# Patient Record
Sex: Female | Born: 1995 | Race: White | Hispanic: No | Marital: Married | Smoking: Never smoker
Health system: Southern US, Community
[De-identification: ages and names within clinical notes are randomized; demographics above are authoritative.]

## PROBLEM LIST (undated history)

## (undated) ENCOUNTER — Inpatient Hospital Stay (HOSPITAL_COMMUNITY): Payer: Self-pay

## (undated) DIAGNOSIS — F909 Attention-deficit hyperactivity disorder, unspecified type: Secondary | ICD-10-CM

## (undated) DIAGNOSIS — Z8719 Personal history of other diseases of the digestive system: Secondary | ICD-10-CM

## (undated) DIAGNOSIS — J189 Pneumonia, unspecified organism: Secondary | ICD-10-CM

## (undated) DIAGNOSIS — B2790 Infectious mononucleosis, unspecified without complication: Secondary | ICD-10-CM

## (undated) DIAGNOSIS — R112 Nausea with vomiting, unspecified: Secondary | ICD-10-CM

## (undated) DIAGNOSIS — F319 Bipolar disorder, unspecified: Secondary | ICD-10-CM

## (undated) DIAGNOSIS — F32A Depression, unspecified: Secondary | ICD-10-CM

## (undated) DIAGNOSIS — K760 Fatty (change of) liver, not elsewhere classified: Secondary | ICD-10-CM

## (undated) DIAGNOSIS — F259 Schizoaffective disorder, unspecified: Secondary | ICD-10-CM

## (undated) DIAGNOSIS — L732 Hidradenitis suppurativa: Secondary | ICD-10-CM

## (undated) DIAGNOSIS — E669 Obesity, unspecified: Secondary | ICD-10-CM

## (undated) DIAGNOSIS — N926 Irregular menstruation, unspecified: Secondary | ICD-10-CM

## (undated) DIAGNOSIS — R569 Unspecified convulsions: Secondary | ICD-10-CM

## (undated) DIAGNOSIS — E1140 Type 2 diabetes mellitus with diabetic neuropathy, unspecified: Secondary | ICD-10-CM

## (undated) DIAGNOSIS — O139 Gestational [pregnancy-induced] hypertension without significant proteinuria, unspecified trimester: Secondary | ICD-10-CM

## (undated) DIAGNOSIS — D649 Anemia, unspecified: Secondary | ICD-10-CM

## (undated) DIAGNOSIS — Z3490 Encounter for supervision of normal pregnancy, unspecified, unspecified trimester: Secondary | ICD-10-CM

## (undated) DIAGNOSIS — N83209 Unspecified ovarian cyst, unspecified side: Secondary | ICD-10-CM

## (undated) DIAGNOSIS — F419 Anxiety disorder, unspecified: Secondary | ICD-10-CM

## (undated) DIAGNOSIS — G4730 Sleep apnea, unspecified: Secondary | ICD-10-CM

## (undated) DIAGNOSIS — O2340 Unspecified infection of urinary tract in pregnancy, unspecified trimester: Secondary | ICD-10-CM

## (undated) DIAGNOSIS — F250 Schizoaffective disorder, bipolar type: Secondary | ICD-10-CM

## (undated) HISTORY — DX: Anemia, unspecified: D64.9

## (undated) HISTORY — DX: Obesity, unspecified: E66.9

## (undated) HISTORY — DX: Personal history of other diseases of the digestive system: Z87.19

## (undated) HISTORY — PX: TONSILLECTOMY: SUR1361

## (undated) HISTORY — PX: ADENOIDECTOMY: SUR15

## (undated) HISTORY — DX: Unspecified ovarian cyst, unspecified side: N83.209

## (undated) HISTORY — PX: ANTERIOR CRUCIATE LIGAMENT REPAIR: SHX115

## (undated) HISTORY — DX: Nausea with vomiting, unspecified: R11.2

## (undated) HISTORY — DX: Irregular menstruation, unspecified: N92.6

## (undated) HISTORY — DX: Hidradenitis suppurativa: L73.2

## (undated) HISTORY — PX: INCISION AND DRAINAGE ABSCESS: SHX5864

## (undated) HISTORY — DX: Infectious mononucleosis, unspecified without complication: B27.90

## (undated) HISTORY — PX: WISDOM TOOTH EXTRACTION: SHX21

## (undated) HISTORY — DX: Gestational (pregnancy-induced) hypertension without significant proteinuria, unspecified trimester: O13.9

## (undated) HISTORY — DX: Encounter for supervision of normal pregnancy, unspecified, unspecified trimester: Z34.90

---
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
## 2016-01-12 ENCOUNTER — Other Ambulatory Visit (HOSPITAL_COMMUNITY): Payer: Self-pay | Admitting: Sports Medicine

## 2016-01-12 DIAGNOSIS — M545 Low back pain: Principal | ICD-10-CM

## 2016-01-12 DIAGNOSIS — G8929 Other chronic pain: Secondary | ICD-10-CM

## 2016-01-26 ENCOUNTER — Telehealth: Payer: Self-pay | Admitting: Obstetrics and Gynecology

## 2016-01-26 NOTE — Telephone Encounter (Signed)
Phentermine requires a visit, and counsel.

## 2016-01-26 NOTE — Telephone Encounter (Signed)
Patient wants to restart Phentermine.Does patient need an appointment or will you send in prescription? Please advise.

## 2016-01-27 NOTE — Telephone Encounter (Signed)
Informed patient she needed appointment for counsel for Phentermine. Pt verbalized understanding.

## 2016-02-02 ENCOUNTER — Ambulatory Visit (INDEPENDENT_AMBULATORY_CARE_PROVIDER_SITE_OTHER): Payer: Medicaid Other | Admitting: "Endocrinology

## 2016-02-02 ENCOUNTER — Ambulatory Visit: Payer: Medicaid Other | Admitting: Obstetrics and Gynecology

## 2016-02-02 ENCOUNTER — Encounter: Payer: Self-pay | Admitting: "Endocrinology

## 2016-02-02 VITALS — BP 118/73 | HR 81 | Ht 65.0 in | Wt 242.0 lb

## 2016-02-02 DIAGNOSIS — E118 Type 2 diabetes mellitus with unspecified complications: Secondary | ICD-10-CM | POA: Diagnosis not present

## 2016-02-02 DIAGNOSIS — Z794 Long term (current) use of insulin: Secondary | ICD-10-CM | POA: Diagnosis not present

## 2016-02-02 MED ORDER — GABAPENTIN 100 MG PO CAPS: 100.0000 mg | ORAL_CAPSULE | Freq: Three times a day (TID) | ORAL | 3 refills | Status: DC

## 2016-02-02 MED ORDER — ACCU-CHEK AVIVA DEVI: 0 refills | Status: DC

## 2016-02-02 MED ORDER — METFORMIN HCL 1000 MG PO TABS: 1000.0000 mg | ORAL_TABLET | Freq: Two times a day (BID) | ORAL | 3 refills | Status: DC

## 2016-02-02 MED ORDER — ENALAPRIL MALEATE 5 MG PO TABS: 5.0000 mg | ORAL_TABLET | Freq: Every day | ORAL | 6 refills | Status: DC

## 2016-02-02 MED ORDER — GLUCOSE BLOOD VI STRP: ORAL_STRIP | 3 refills | Status: DC

## 2016-02-02 NOTE — Patient Instructions (Signed)

## 2016-02-02 NOTE — Progress Notes (Signed)
Subjective:    Patient ID: Heather [name], female    DOB: 21-Jan-1995. Patient is being seen in consultation for management of diabetes requested by  [facility]  Past Medical History:  Diagnosis Date  . ADHD (attention deficit hyperactivity disorder)   . Anemia   . Asthma   . Bipolar 1 disorder ([place])   . Diabetes mellitus   . GHTN vs. CHTN 06/11/2015  . Hidradenitis 06/05/2012  . History of hiatal hernia   . Irregular bleeding 06/05/2012  . Mononucleosis january 2014  . Nausea and vomiting 01/21/2015  . Obesity (BMI 30-39.9)   . Other and unspecified ovarian cyst 07/24/2012   Right  Ovarian cyst 8 cm on [place] 7/15 in ER  . Pregnant 01/21/2015  . Schizo affective schizophrenia [facility])    Past Surgical History:  Procedure Laterality Date  . ADENOIDECTOMY    . ANTERIOR CRUCIATE LIGAMENT REPAIR    . 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 TONSILLECTOMY     Social History   Social History  . Marital status: Married    Spouse name: N/A  . Number of children: N/A  . Years of education: N/A   Social History Main Topics  . Smoking status: Never Smoker  . Smokeless tobacco: Never Used  . Alcohol use No  . Drug use: No  . Sexual activity: Not Currently    Birth control/ protection: None   Other Topics Concern  . None   Social History Narrative  . None   Outpatient Encounter Prescriptions as of 02/02/2016  Medication Sig  . enalapril (VASOTEC) 5 MG tablet Take 1 tablet (5 mg total) by mouth daily.  Marland Kitchen gabapentin (NEURONTIN) 100 MG capsule Take 1 capsule (100 mg total) by mouth 3 (three) times daily.  . insulin aspart (NOVOLOG FLEXPEN) 100 UNIT/ML FlexPen Inject 1 Units into the skin 3 (three) times daily with meals.  . Insulin Glargine (LANTUS SOLOSTAR) 100 UNIT/ML Solostar Pen Inject 40 Units into the skin at bedtime.  . [DISCONTINUED] enalapril (VASOTEC) 10 MG tablet Take 10 mg by mouth daily.  .  [DISCONTINUED] gabapentin (NEURONTIN) 300 MG capsule Take 300 mg by mouth daily.  . Blood Glucose Monitoring Suppl (ACCU-CHEK AVIVA) device Use as instructed  . glucose blood (ACCU-CHEK AVIVA) test strip Use to test glucose 4 times a day  . metFORMIN (GLUCOPHAGE) 1000 MG tablet Take 1 tablet (1,000 mg total) by mouth 2 (two) times daily with a meal.  . [DISCONTINUED] escitalopram (LEXAPRO) 20 MG tablet Take 1 tablet daily  . [DISCONTINUED] metFORMIN (GLUCOPHAGE) 1000 MG tablet Take 1 tablet (1,000 mg total) by mouth 2 (two) times daily with a meal.  . [DISCONTINUED] metroNIDAZOLE (FLAGYL) 500 MG tablet Take 1 tablet (500 mg total) by mouth 2 (two) times daily. X 7 days. No sex or alcohol while taking  . [DISCONTINUED] pantoprazole (PROTONIX) 20 MG tablet Take 20 mg by mouth daily.  . [DISCONTINUED] tiZANidine (ZANAFLEX) 4 MG tablet Take 4 mg by mouth every 6 (six) hours as needed for muscle spasms.   Facility-Administered Encounter Medications as of 02/02/2016  Medication  . oxyCODONE-acetaminophen (PERCOCET/ROXICET) 5-325 MG per tablet 1 tablet   ALLERGIES: Allergies  Allergen Reactions  . Prozac [Fluoxetine Hcl]     Stiffness in neck, "stroke like" symptoms  . Cephalosporins Other (See Comments)    Unknown childhood reaction  . Anette Guarneri [Lurasidone  Hcl]   . Prozac [Fluoxetine Hcl]    VACCINATION STATUS: Immunization History  Administered Date(s) Administered  . DTaP 08/15/1996, 10/16/1996, 12/19/1996, 02/13/1998, 07/18/2001  . H1N1 11/09/2007  . HPV Quadrivalent 11/03/2010, 05/17/2013  . Hepatitis A, Ped/Adol-2 Dose 05/17/2013  . Hepatitis B 1995-06-01, 08/15/1996, 12/19/1996  . HiB (PRP-OMP) 08/15/1996, 10/16/1996, 02/13/1998  . IPV 08/15/1996, 10/16/1996, 02/13/1998, 07/18/2001  . Influenza Whole 11/03/2005, 11/15/2006, 11/09/2007, 09/13/2008, 09/12/2009, 11/03/2010  . Influenza,inj,Quad PF,36+ Mos 10/16/2013  . Influenza-Unspecified 10/23/2012  . MMR 07/15/1997, 07/18/2001  .  Meningococcal Conjugate 09/13/2008  . Pneumococcal Conjugate-13 07/31/1999  . Td 08/22/2007  . Tdap 08/22/2007, 06/27/2014  . Varicella 07/15/1997, 09/13/2008    Diabetes  She presents for her initial diabetic visit. She has type 2 diabetes mellitus. Onset time: She was diagnosed at age 55 or 57 years. Disease course: She states that she was treated only with metformin until last year when she was pregnant. Due to loss of control with A1c of 8.5%, she is currently on basal/bolus insulin along with her metformin. She denies any history of DKA. She is 4 months postpartum  There are no hypoglycemic associated symptoms. Pertinent negatives for hypoglycemia include no confusion, headaches, pallor or seizures. Associated symptoms include fatigue, foot paresthesias, polydipsia and polyuria. Pertinent negatives for diabetes include no chest pain and no polyphagia. There are no hypoglycemic complications. Symptoms are worsening. Diabetic complications include peripheral neuropathy. Risk factors for coronary artery disease include diabetes mellitus, hypertension and obesity. Current diabetic treatment includes insulin injections (She is on Lantus 55 units daily at bedtime, NovoLog 10 units on average 3 times a day before meals, deformity 1000 mg by mouth twice a day.). Her weight is increasing steadily. She is following a generally unhealthy diet. When asked about meal planning, she reported none. She has not had a previous visit with a dietitian. She rarely participates in exercise. (She states she does not have working meter, she did not bring any logs to review today.) An ACE inhibitor/angiotensin II receptor blocker is being taken. She does not see a podiatrist.Eye exam is not current.       Review of Systems  Constitutional: Positive for fatigue. Negative for chills, fever and unexpected weight change.  HENT: Negative for trouble swallowing and voice change.   Eyes: Negative for visual disturbance.   Respiratory: Negative for cough, shortness of breath and wheezing.   Cardiovascular: Negative for chest pain, palpitations and leg swelling.  Gastrointestinal: Negative for diarrhea, nausea and vomiting.  Endocrine: Positive for polydipsia and polyuria. Negative for cold intolerance, heat intolerance and polyphagia.  Musculoskeletal: Negative for arthralgias and myalgias.  Skin: Negative for color change, pallor, rash and wound.  Neurological: Negative for seizures and headaches.  Psychiatric/Behavioral: Negative for confusion and suicidal ideas.    Objective:    BP 118/73   Pulse 81   Ht 5' 5"  (1.651 m)   Wt 242 lb (109.8 kg)   BMI 40.27 kg/m   Wt Readings from Last 3 Encounters:  02/02/16 242 lb (109.8 kg) (>99 %, Z > 2.33)*  10/20/15 239 lb (108.4 kg) (>99 %, Z > 2.33)*  09/15/15 227 lb 6.4 oz (103.1 kg) (99 %, Z= 2.28)*   * Growth percentiles are based on CDC 2-20 Years data.    Physical Exam  Constitutional: She is oriented to person, place, and time. She appears well-developed.  HENT:  Head: Normocephalic and atraumatic.  Eyes: EOM are normal.  Neck: Normal range of motion. Neck supple. No tracheal  deviation present. No thyromegaly present.  Cardiovascular: Normal rate and regular rhythm.   Pulmonary/Chest: Effort normal and breath sounds normal.  Abdominal: Soft. Bowel sounds are normal. There is no tenderness. There is no guarding.  Musculoskeletal: Normal range of motion. She exhibits no edema.  Neurological: She is alert and oriented to person, place, and time. She has normal reflexes. No cranial nerve deficit. Coordination normal.  Skin: Skin is warm and dry. No rash noted. No erythema. No pallor.  Psychiatric: She has a normal mood and affect. Judgment normal.     CMP ( most recent) CMP     Component Value Date/Time   NA 134 (L) 09/03/2015 2343   NA 138 07/16/2015 1121   K 3.9 09/03/2015 2343   CL 106 09/03/2015 2343   CO2 18 (L) 09/03/2015 2343    GLUCOSE 91 09/03/2015 2343   BUN 5 (L) 09/03/2015 2343   BUN 5 (L) 07/16/2015 1121   CREATININE 0.37 (L) 09/03/2015 2343   CREATININE 0.61 03/28/2012 1109   CALCIUM 8.4 (L) 09/03/2015 2343   PROT 6.4 (L) 09/03/2015 2343   PROT 6.9 07/16/2015 1121   ALBUMIN 3.0 (L) 09/03/2015 2343   ALBUMIN 4.2 07/16/2015 1121   AST 47 (H) 09/03/2015 2343   ALT 27 09/03/2015 2343   ALKPHOS 111 09/03/2015 2343   BILITOT 0.3 09/03/2015 2343   BILITOT <0.2 07/16/2015 1121   GFRNONAA >60 09/03/2015 2343   GFRNONAA >89 03/28/2012 1109   GFRAA >60 09/03/2015 2343   GFRAA >89 03/28/2012 1109     Diabetic Labs (most recent): Lab Results  Component Value Date   HGBA1C 8.5 12/23/2015   HGBA1C 6.3 (H) 03/19/2015   HGBA1C 6.8 (H) 03/28/2012     Lipid Panel ( most recent) Lipid Panel     Component Value Date/Time   CHOL 152 04/10/2013 0904   TRIG 158 (H) 04/10/2013 0904   HDL 34 (L) 04/10/2013 0904   CHOLHDL 4.5 04/10/2013 0904   VLDL 32 04/10/2013 0904   LDLCALC 86 04/10/2013 0904              Assessment & Plan:   1. Type 2 diabetes mellitus with complication, with long-term current use of insulin ([place]) - Patient has currently uncontrolled symptomatic type  ( likely 2) DM since  ?21 years of age,  with most recent A1c of 8.5 %. Recent labs reviewed. - Reportedly, she was treated only with metformin until last year when she was pregnant and required insulin treatment, currently on basal/bolus insulin.   - She reports peripheral neuropathy as a complication of diabetes, and patient remains at a high risk for more acute and chronic complications of diabetes which include CAD, CVA, CKD, retinopathy, and neuropathy. These are all discussed in detail with the patient.  - I have counseled the patient on diet management and weight loss, by adopting a carbohydrate restricted/protein rich diet.  - Suggestion is made for patient to avoid simple carbohydrates   from their diet including Cakes ,  Desserts, Ice Cream,  Soda (  diet and regular) , Sweet Tea , Candies,  Chips, Cookies, Artificial Sweeteners,   and "Sugar-free" Products . This will help patient to have stable blood glucose profile and potentially avoid unintended weight gain.  - I encouraged the patient to switch to  unprocessed or minimally processed complex starch and increased protein intake (animal or plant source), fruits, and vegetables.  - Patient is advised to stick to a routine mealtimes to eat  3 meals  a day and avoid unnecessary snacks ( to snack only to correct hypoglycemia).  - The patient will be scheduled with Jearld Fenton, RDN, CDE for individualized DM education.  - I have approached patient with the following individualized plan to manage diabetes and patient agrees:   - Given her body habitus, absence of DKA and her history, therapy with metformin with reasonable A1c until last year despite the fact that she was diagnosed at age 15, point to a possibility of type 2 versus type 1 diabetes. - She will be tested with anti-pancreatic antibodies when she approaches target A1c of 7%.   - For now, I  will proceed to initiate  strict monitoring of glucose  AC and HS for one week and return with her meter and logs to review. - I will proceed to lower her Lantus to 40 units daily at bedtime. I advised her to hold NovoLog for now. - I advised her to continue metformin 1000 mg by mouth twice a day.  - Patient will be considered for incretin therapy as appropriate next visit. - Patient specific target  A1c;  LDL, HDL, Triglycerides, and  Waist Circumference were discussed in detail.  2) BP/HTN: Controlled.  I lowered her enalapril 25 mg by mouth every morning. 3) Lipids/HPL:   Controlled, LDL of 86. No recent fasting lipid panel.  She is not on any statins for now. 4)  Weight/Diet: CDE Consult will be initiated , exercise, and detailed carbohydrates information provided.  5) Chronic Care/Health  Maintenance:  -Patient is on ACEi medications and encouraged to continue to follow up with Ophthalmology, Podiatrist at least yearly or according to recommendations, and advised to   stay away from smoking. I have recommended yearly flu vaccine and pneumonia vaccinatio n at least every 5 years; moderate intensity exercise for up to 150 minutes weekly; and  sleep for at least 7 hours a day.  - 60 minutes of time was spent on the care of this patient , 50% of which was applied for counseling on diabetes complications and their preventions.  - Patient to bring meter and  blood glucose logs during her next visit.   - I advised patient to maintain close follow up with [facility] for primary care needs.  Follow up plan: - Return in about 1 week (around 02/09/2016) for follow up with meter and logs- no labs.  Glade Lloyd, MD Phone: [phone]  Fax: [phone]   02/02/2016, 11:47 AM

## 2016-02-06 ENCOUNTER — Inpatient Hospital Stay (HOSPITAL_COMMUNITY)
Admission: RE | Admit: 2016-02-06 | Discharge: 2016-02-06 | Disposition: A | Discharge status: Home / Self Care | Payer: Self-pay | Source: Ambulatory Visit

## 2016-02-06 NOTE — Pre-Procedure Instructions (Signed)
Heather [name]  02/06/2016      Your procedure is scheduled on Thursday February 8.  Report to [facility] Admitting at 6:00 A.M.  Call this number if you have problems the morning of surgery:  [phone]   Remember:  Do not eat food or drink liquids after midnight.  Take these medicines the morning of surgery with A SIP OF WATER: acetaminophen (tylenol), gabapentin (neurontin)  7 days prior to surgery STOP taking any Aspirin, Aleve, Naproxen, Ibuprofen, Motrin, Advil, Goody's, BC's, all herbal medications, fish oil, and all vitamins  WHAT DO I DO ABOUT MY DIABETES MEDICATION?   Marland Kitchen Do not take oral diabetes medicines (pills) the morning of surgery. Do not take metformin (glucophage) the day of surgery  . THE NIGHT BEFORE SURGERY, take 20 units of lantus insulin.   How to Manage Your Diabetes Before and After Surgery  Why is it important to control my blood sugar before and after surgery? . Improving blood sugar levels before and after surgery helps healing and can limit problems. . A way of improving blood sugar control is eating a healthy diet by: o  Eating less sugar and carbohydrates o  Increasing activity/exercise o  Talking with your doctor about reaching your blood sugar goals . High blood sugars (greater than 180 mg/dL) can raise your risk of infections and slow your recovery, so you will need to focus on controlling your diabetes during the weeks before surgery. . Make sure that the doctor who takes care of your diabetes knows about your planned surgery including the date and location.  How do I manage my blood sugar before surgery? . Check your blood sugar at least 4 times a day, starting 2 days before surgery, to make sure that the level is not too high or low. o Check your blood sugar the morning of your surgery when you wake up and every 2 hours until you get to the Short Stay unit. . If your blood sugar is less than 70 mg/dL, you will need to treat for  low blood sugar: o Do not take insulin. o Treat a low blood sugar (less than 70 mg/dL) with  cup of clear juice (cranberry or apple), 4 glucose tablets, OR glucose gel. o Recheck blood sugar in 15 minutes after treatment (to make sure it is greater than 70 mg/dL). If your blood sugar is not greater than 70 mg/dL on recheck, call [phone] for further instructions. . Report your blood sugar to the short stay nurse when you get to Short Stay.  . If you are admitted to the hospital after surgery: o Your blood sugar will be checked by the staff and you will probably be given insulin after surgery (instead of oral diabetes medicines) to make sure you have good blood sugar levels. o The goal for blood sugar control after surgery is 80-180 mg/dL.   Do not wear jewelry, make-up or nail polish.  Do not wear lotions, powders, or perfumes, or deodorant.  Do not shave 48 hours prior to surgery.    Do not bring valuables to the hospital.  [facility] is not responsible for any belongings or valuables.  Contacts, dentures or bridgework may not be worn into surgery.  Leave your suitcase in the car.  After surgery it may be brought to your room.  For patients admitted to the hospital, discharge time will be determined by your treatment team.  Patients discharged the day of surgery will not be  allowed to drive home.

## 2016-02-08 ENCOUNTER — Encounter (HOSPITAL_COMMUNITY): Payer: Self-pay | Admitting: Anesthesiology

## 2016-02-09 ENCOUNTER — Ambulatory Visit: Payer: Medicaid Other | Admitting: "Endocrinology

## 2016-02-11 ENCOUNTER — Encounter (HOSPITAL_COMMUNITY): Payer: Self-pay | Admitting: *Deleted

## 2016-02-11 NOTE — Progress Notes (Signed)
Spoke with pt for pre-op call. Pt denies cardiac history. Pt is diabetic, type 2 since age 89. Pt's last A1C was 8.5 on 12/23/15. Pt states her fasting blood sugar is usually high in the 200's. I informed her that her procedure could be cancelled if her blood sugar is 250 or greater in the AM. Pt instructed to take 1/2 of her regular dose of Lantus this evening (will take 20 units). Instructed to check blood sugar in the AM. If blood sugar is 70 or below, treat with 1/2 cup of clear juice (apple or cranberry) and recheck blood sugar 15 minutes after drinking juice. Pt voiced understanding

## 2016-02-12 ENCOUNTER — Ambulatory Visit (HOSPITAL_COMMUNITY)
Admission: RE | Admit: 2016-02-12 | Discharge: 2016-02-12 | Disposition: A | Discharge status: Home / Self Care | Payer: Medicaid Other | Source: Ambulatory Visit | Attending: Sports Medicine | Admitting: Sports Medicine

## 2016-02-12 ENCOUNTER — Ambulatory Visit (HOSPITAL_COMMUNITY)
Admission: RE | Admit: 2016-02-12 | Payer: Medicaid Other | Source: Ambulatory Visit | Admitting: Unknown Physician Specialty

## 2016-02-12 ENCOUNTER — Encounter (HOSPITAL_COMMUNITY): Payer: Self-pay

## 2016-02-12 ENCOUNTER — Telehealth: Payer: Self-pay | Admitting: "Endocrinology

## 2016-02-12 HISTORY — DX: Sleep apnea, unspecified: G47.30

## 2016-02-12 HISTORY — DX: Unspecified convulsions: R56.9

## 2016-02-12 HISTORY — DX: Fatty (change of) liver, not elsewhere classified: K76.0

## 2016-02-12 HISTORY — DX: Type 2 diabetes mellitus with diabetic neuropathy, unspecified: E11.40

## 2016-02-12 HISTORY — DX: Pneumonia, unspecified organism: J18.9

## 2016-02-12 SURGERY — RADIOLOGY WITH ANESTHESIA
Anesthesia: General

## 2016-02-12 NOTE — Telephone Encounter (Signed)
Pt notifed.

## 2016-02-12 NOTE — Telephone Encounter (Signed)
Pt is having pain in her hands and feet when she puts them in water. Feels like pins and needles

## 2016-02-12 NOTE — Telephone Encounter (Signed)
Patient needs to return for her diabetes care with her meter and logs. She has to understand we don't prescribe pain medications. Proper treatment for diabetes will help with diabetes  related nerve pain.

## 2016-02-19 ENCOUNTER — Ambulatory Visit: Payer: Self-pay | Admitting: Nutrition

## 2016-02-23 ENCOUNTER — Ambulatory Visit: Payer: Medicaid Other | Admitting: Obstetrics and Gynecology

## 2016-02-27 ENCOUNTER — Ambulatory Visit: Payer: Medicaid Other | Admitting: "Endocrinology

## 2016-03-18 ENCOUNTER — Ambulatory Visit (INDEPENDENT_AMBULATORY_CARE_PROVIDER_SITE_OTHER): Payer: Medicaid Other | Admitting: Orthopaedic Surgery

## 2016-03-25 ENCOUNTER — Encounter (INDEPENDENT_AMBULATORY_CARE_PROVIDER_SITE_OTHER): Payer: Self-pay | Admitting: Orthopaedic Surgery

## 2016-03-25 ENCOUNTER — Ambulatory Visit (INDEPENDENT_AMBULATORY_CARE_PROVIDER_SITE_OTHER): Payer: Medicaid Other | Admitting: Orthopaedic Surgery

## 2016-03-25 DIAGNOSIS — M545 Low back pain, unspecified: Secondary | ICD-10-CM | POA: Insufficient documentation

## 2016-03-25 DIAGNOSIS — G8929 Other chronic pain: Secondary | ICD-10-CM

## 2016-03-25 NOTE — Progress Notes (Signed)
Office Visit Note   Patient: Heather [name]           Date of Birth: June 30, 1995           MRN: [id number] Visit Date: 03/25/2016              [facility] [address] [place], [place] [id number] [facility]   Assessment & Plan: Visit Diagnoses:  1. 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 We discussed she needs to work on losing at least 40 pounds. She needs to work on core strengthening. No  radiculopathy on exam. We discussed activity modification and the way that she picks up her daughter. She finally does get her sugars down and gets her MRI be happy to review it I discussed expected will be similar to the MRI 04/27/2012. We discussed lifestyle modifications to improve her weight, help her diabetes. Without nerve root compression she probably could just take the Neurontin at night if she is having trouble sleeping does she gets her weight down, diabetes under better control and gets herself back into shape.  Follow-Up Instructions: Return if symptoms worsen or fail to improve.   Orders:  No orders of the defined types were placed in this encounter.  No orders of the defined types were placed in this encounter.     Procedures: No procedures performed   Clinical Data: No additional findings.   Subjective: Chief Complaint  Patient presents with  . Lower Back - Pain    Patient presents with low back pain. She has seen different physicians for the pain and a MRI Lumbar Spine was ordered. She was unable to do the MRI even in an open unit with sedation. She was scheduled to have conscious IV sedation for MRI but was unable to have that done due to her blood sugar being too high.   Patient has significant problems with claustrophobia. Glucose was over 200 so they did not do the conscious sedation and obtain an MRI. Previous MRI 2014 showed  evidence of bilateral pars defects without disc space narrowing at 51 no disc dehydration and no spondylolisthesis. She did not have evidence of impingement. She has primarily pain on the right side not left.  Review of Systems PA system positive for type 2 diabetes at age 21. She has increased BMI is 4530-month-old daughter, chronic asthma, microalbuminemia, bipolar disorder, elevated liver function tests, chronic low back pain primarily right side.   Objective: Vital Signs: BP 125/74   Pulse 92   Ht 5\' 5"  (1.651 m)   Wt 237 lb (107.5 kg)   BMI 39.44 kg/m   Physical Exam  Constitutional: She is oriented to person, place, and time. She appears well-developed.  HENT:  Head: Normocephalic.  Right Ear: External ear normal.  Left Ear: External ear normal.  Eyes: Pupils are equal, round, and reactive to light.  Neck: No tracheal deviation present. No thyromegaly present.  Cardiovascular: Normal rate.   Pulmonary/Chest: Effort normal.  Abdominal: Soft.  Musculoskeletal:  Patient is lumbosacral tenderness also some tenderness joints scapula. Right sciatic notch tenderness no trochanteric bursal tenderness negative straight leg raising 90 no pain with hip range of motion. She complains of pain with Pearlean BrownieFaber testing in supine position. When she sits on the exam table she crosses her legs underneath her and sits on her feet. She also figure 4  [place] style or sitting in discussing with the complaints of pain. Reflexes are 1+ and symmetrical no of weakness L4-L5 innervated muscles hip flexors adductors and abductors are strong. Normal heel toe gait she can heel and toe walk no fatigue negative Homan distal pulses normal. No plantar foot lesions.  Neurological: She is alert and oriented to person, place, and time.  Skin: Skin is warm and dry.  Psychiatric: She has a normal mood and affect. Her behavior is normal.   patient can do a sit up but complains of some back pain when she does it.  Ortho  Exam  Specialty Comments:  No specialty comments available.  Imaging: No results found.   PMFS History: Patient Active Problem List   Diagnosis Date Noted  . Low back pain 03/25/2016  . History of gestational hypertension 10/20/2015  . History of preterm delivery 10/20/2015  . Depression 07/16/2015  . Boil 07/10/2015  . Elevated LFTs 07/10/2015  . Deficiency anemia 07/15/2013  . 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 Hidradenitis 06/05/2012  . Bipolar disorder, unspecified 03/21/2012  . Diabetes mellitus, type 2 (HCC) 03/21/2012  . Asthma, chronic 03/21/2012  . Hypercholesteremia 01/04/2012  . Bulimia nervosa, purging type 11/05/2010  . Moderate mood disorder (HCC) 11/05/2010   Past Medical History:  Diagnosis Date  . ADHD (attention deficit hyperactivity disorder)   . Anemia   . Asthma    as a child  . Bipolar 1 disorder (HCC)   . Diabetes mellitus    type 2  . Diabetic neuropathy (HCC)   . Fatty liver   . GHTN vs. CHTN 06/11/2015  . Hidradenitis 06/05/2012  . History of hiatal hernia   . Irregular bleeding 06/05/2012  . Mononucleosis january 2014  . Nausea and vomiting 01/21/2015  . Obesity (BMI 30-39.9)   . Other and unspecified ovarian cyst 07/24/2012   Right  Ovarian cyst 8 cm on [place] 7/15 in ER  . Pneumonia   . Pregnant 01/21/2015  . Schizo affective schizophrenia (HCC)   . Seizures (HCC)    febrile seizures as a child  . Sleep apnea    no cpap    Family History  Problem Relation Age of Onset  . Diabetes Mother   . Hypertension Mother   . Bipolar disorder Father   . Bipolar disorder Brother   . Anxiety disorder Brother   . Asthma Sister     intrinsic  . Stroke Maternal Grandmother   . Cancer Other     colon, lung, brain-paternal side    Past Surgical History:  Procedure Laterality Date  . ADENOIDECTOMY    . ANTERIOR CRUCIATE LIGAMENT REPAIR    . INCISION AND DRAINAGE ABSCESS     Recurrent abscesses of labia and groin area with I  and D in office and ER  . TONSILLECTOMY    . WISDOM TOOTH EXTRACTION     Social History   Occupational History  . Not on file.   Social History Main Topics  . Smoking status: Never Smoker  . Smokeless tobacco: Never Used  . Alcohol use No  . Drug use: No  . Sexual activity: Not Currently    Birth control/ protection: None

## 2016-04-05 ENCOUNTER — Ambulatory Visit: Payer: Medicaid Other | Admitting: "Endocrinology

## 2016-05-20 ENCOUNTER — Emergency Department (HOSPITAL_COMMUNITY)
Admission: EM | Admit: 2016-05-20 | Discharge: 2016-05-20 | Disposition: A | Discharge status: Home / Self Care | Payer: Medicaid Other | Attending: Emergency Medicine | Admitting: Emergency Medicine

## 2016-05-20 ENCOUNTER — Encounter (HOSPITAL_COMMUNITY): Payer: Self-pay | Admitting: *Deleted

## 2016-05-20 DIAGNOSIS — F909 Attention-deficit hyperactivity disorder, unspecified type: Secondary | ICD-10-CM | POA: Diagnosis not present

## 2016-05-20 DIAGNOSIS — E119 Type 2 diabetes mellitus without complications: Secondary | ICD-10-CM | POA: Diagnosis not present

## 2016-05-20 DIAGNOSIS — Z794 Long term (current) use of insulin: Secondary | ICD-10-CM | POA: Diagnosis not present

## 2016-05-20 DIAGNOSIS — L02411 Cutaneous abscess of right axilla: Secondary | ICD-10-CM | POA: Diagnosis not present

## 2016-05-20 DIAGNOSIS — J45909 Unspecified asthma, uncomplicated: Secondary | ICD-10-CM | POA: Diagnosis not present

## 2016-05-20 DIAGNOSIS — L732 Hidradenitis suppurativa: Secondary | ICD-10-CM | POA: Diagnosis not present

## 2016-05-20 DIAGNOSIS — L0291 Cutaneous abscess, unspecified: Secondary | ICD-10-CM

## 2016-05-20 MED ORDER — TRAMADOL HCL 50 MG PO TABS: 50.0000 mg | ORAL_TABLET | Freq: Four times a day (QID) | ORAL | 0 refills | Status: DC | PRN

## 2016-05-20 MED ORDER — DOXYCYCLINE HYCLATE 100 MG PO CAPS: 100.0000 mg | ORAL_CAPSULE | Freq: Two times a day (BID) | ORAL | 0 refills | Status: DC

## 2016-05-20 MED ORDER — CLINDAMYCIN PHOSPHATE 1 % EX GEL: Freq: Two times a day (BID) | CUTANEOUS | 0 refills | Status: DC

## 2016-05-20 NOTE — ED Provider Notes (Signed)
[facility]ENCY DEPT Provider Note   CSN: [id number] Arrival date & time: 05/20/16  2101     History   Chief Complaint Chief Complaint  Patient presents with  . Abscess    HPI Heather [name] is a 21 y.o. female.  The history is provided by the patient. No language interpreter was used.  Abscess  Location:  Shoulder/arm Size:  1 cm Abscess quality: redness and warmth   Red streaking: no   Progression:  Worsening Relieved by:  Nothing Worsened by:  Nothing Ineffective treatments:  None tried Risk factors: prior abscess    Pt complains of abscess under right axilla and left groin.  Pt is diabetic.  She has a history of Hidradenitis.  Pt has been told by dermatologist that she can not have surgery until diabetes is controlled.  Pt reports her diabetes has never been controlled Past Medical History:  Diagnosis Date  . ADHD (attention deficit hyperactivity disorder)   . Anemia   . Asthma    as a child  . Bipolar 1 disorder (HCC)   . Diabetes mellitus    type 2  . Diabetic neuropathy (HCC)   . Fatty liver   . GHTN vs. CHTN 06/11/2015  . Hidradenitis 06/05/2012  . History of hiatal hernia   . Irregular bleeding 06/05/2012  . Mononucleosis january 2014  . Nausea and vomiting 01/21/2015  . Obesity (BMI 30-39.9)   . Other and unspecified ovarian cyst 07/24/2012   Right  Ovarian cyst 8 cm on [place] 7/15 in ER  . Pneumonia   . Pregnant 01/21/2015  . Schizo affective schizophrenia (HCC)   . Seizures (HCC)    febrile seizures as a child  . Sleep apnea    no cpap    Patient Active Problem List   Diagnosis Date Noted  . Low back pain 03/25/2016  . History of gestational hypertension 10/20/2015  . History of preterm delivery 10/20/2015  . Depression 07/16/2015  . Boil 07/10/2015  . Elevated LFTs 07/10/2015  . Deficiency anemia 07/15/2013  . 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 Hidradenitis 06/05/2012  . Bipolar disorder, unspecified (HCC) 03/21/2012  .  Diabetes mellitus, type 2 (HCC) 03/21/2012  . Asthma, chronic 03/21/2012  . Hypercholesteremia 01/04/2012  . Bulimia nervosa, purging type 11/05/2010  . Moderate mood disorder (HCC) 11/05/2010    Past Surgical History:  Procedure Laterality Date  . ADENOIDECTOMY    . ANTERIOR CRUCIATE LIGAMENT REPAIR    . 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 TONSILLECTOMY    . WISDOM TOOTH EXTRACTION      OB History    Gravida Para Term Preterm AB Living   1 1   1   1    SAB TAB Ectopic Multiple Live Births         0 1       Home Medications    Prior to Admission medications   Medication Sig Start Date End Date Taking? Authorizing Provider  acetaminophen (TYLENOL) 500 MG tablet Take 1,000 mg by mouth every 6 (six) hours as needed for moderate pain or headache.    [provider]  Blood Glucose Monitoring Suppl (ACCU-CHEK AVIVA) device Use as instructed 02/02/16   Roma Kayser, MD  clindamycin (CLINDAGEL) 1 % gel Apply topically 2 (two) times daily. 05/20/16   Elson Areas, PA-C  doxycycline (VIBRAMYCIN) 100 MG capsule Take 1  capsule (100 mg total) by mouth 2 (two) times daily. 05/20/16   Elson Areas, PA-C  enalapril (VASOTEC) 5 MG tablet Take 1 tablet (5 mg total) by mouth daily. Patient not taking: Reported on 03/25/2016 02/02/16   Roma Kayser, MD  gabapentin (NEURONTIN) 100 MG capsule Take 1 capsule (100 mg total) by mouth 3 (three) times daily. 02/02/16   Roma Kayser, MD  glucose blood (ACCU-CHEK AVIVA) test strip Use to test glucose 4 times a day 02/02/16   Roma Kayser, MD  ibuprofen (ADVIL,MOTRIN) 200 MG tablet Take 800 mg by mouth every 6 (six) hours as needed for moderate pain.    [provider]  Insulin Glargine (LANTUS SOLOSTAR) 100 UNIT/ML Solostar Pen Inject 40 Units into the skin at bedtime.    [provider]  metFORMIN (GLUCOPHAGE) 1000 MG  tablet Take 1 tablet (1,000 mg total) by mouth 2 (two) times daily with a meal. 02/02/16   Nida, Denman George, MD  traMADol (ULTRAM) 50 MG tablet Take 1 tablet (50 mg total) by mouth every 6 (six) hours as needed. 05/20/16   Elson Areas, PA-C    Family History Family History  Problem Relation Age of Onset  . Diabetes Mother   . Hypertension Mother   . Bipolar disorder Father   . Bipolar disorder Brother   . Anxiety disorder Brother   . Asthma Sister        intrinsic  . Stroke Maternal Grandmother   . Cancer Other        colon, lung, brain-paternal side    Social History Social History  Substance Use Topics  . Smoking status: Never Smoker  . Smokeless tobacco: Never Used  . Alcohol use No     Allergies   Prozac [fluoxetine hcl]; Cephalosporins; and Latuda [lurasidone hcl]   Review of Systems Review of Systems  All other systems reviewed and are negative.    Physical Exam Updated Vital Signs BP 114/84 (BP Location: Left Arm)   Pulse (!) 109   Temp 98.2 F (36.8 C) (Oral)   Resp 18   Ht 5\' 4"  (1.626 m)   Wt 225 lb (102.1 kg)   LMP 05/13/2016   SpO2 96%   BMI 38.62 kg/m   Physical Exam  Constitutional: She appears well-developed and well-nourished.  HENT:  Head: Normocephalic.  Right Ear: External ear normal.  Left Ear: External ear normal.  Mouth/Throat: Oropharynx is clear and moist.  Eyes: Pupils are equal, round, and reactive to light.  Neck: Normal range of motion.  Cardiovascular: Normal rate.   Pulmonary/Chest: Effort normal.  Abdominal: Soft.  Neurological: She is alert.  Skin: Skin is warm.  Psychiatric: She has a normal mood and affect.  Nursing note and vitals reviewed.    ED Treatments / Results  Labs (all labs ordered are listed, but only abnormal results are displayed) Labs Reviewed - No data to display  EKG  EKG Interpretation None       Radiology No results found.  Procedures Procedures (including critical care  time)  Medications Ordered in ED Medications - No data to display   Initial Impression / Assessment and Plan / ED Course  I have reviewed the triage vital signs and the nursing notes.  Pertinent labs & imaging results that were available during my care of the patient were reviewed by me and considered in my medical decision making (see chart for details).       Final Clinical Impressions(s) /  ED Diagnoses   Final diagnoses:  Abscess  Abscess of axilla, right  Hidradenitis suppurativa    New Prescriptions Discharge Medication List as of 05/20/2016 10:07 PM    An After Visit Summary was printed and given to the patient. Meds ordered this encounter  Medications  . doxycycline (VIBRAMYCIN) 100 MG capsule    Sig: Take 1 capsule (100 mg total) by mouth 2 (two) times daily.    Dispense:  20 capsule    Refill:  0    Order Specific Question:   Supervising Provider    Answer:   MILLER, BRIAN [[id number]]  . clindamycin (CLINDAGEL) 1 % gel    Sig: Apply topically 2 (two) times daily.    Dispense:  30 g    Refill:  0    Order Specific Question:   Supervising Provider    Answer:   MILLER, BRIAN [[id number]]  . traMADol (ULTRAM) 50 MG tablet    Sig: Take 1 tablet (50 mg total) by mouth every 6 (six) hours as needed.    Dispense:  12 tablet    Refill:  0    Order Specific Question:   Supervising Provider    Answer:   Eber HongMILLER, BRIAN [[id number]]     Elson AreasSofia, Jovann Luse K, PA-C 05/21/16 0000    Lavera GuiseLiu, Dana Duo, MD 05/21/16 [phone]

## 2016-05-20 NOTE — ED Notes (Signed)
Pt ambulatory to waiting room. Pt verbalized understanding of discharge instructions.   

## 2016-05-20 NOTE — ED Triage Notes (Signed)
Pt c/o hard knot under right arm and red painful bump to left upper thigh for the last few days

## 2016-05-20 NOTE — Discharge Instructions (Signed)
See your Physician for recheck of diabetes

## 2016-08-09 ENCOUNTER — Ambulatory Visit: Payer: Medicaid Other | Admitting: "Endocrinology

## 2016-08-25 ENCOUNTER — Other Ambulatory Visit: Payer: Self-pay

## 2016-08-25 ENCOUNTER — Other Ambulatory Visit: Payer: Self-pay | Admitting: "Endocrinology

## 2016-08-25 DIAGNOSIS — E1165 Type 2 diabetes mellitus with hyperglycemia: Secondary | ICD-10-CM

## 2016-08-25 DIAGNOSIS — IMO0002 Reserved for concepts with insufficient information to code with codable children: Secondary | ICD-10-CM

## 2016-08-25 DIAGNOSIS — E118 Type 2 diabetes mellitus with unspecified complications: Principal | ICD-10-CM

## 2016-08-28 LAB — COMPREHENSIVE METABOLIC PANEL
A/G RATIO: 1.3 (ref 1.2–2.2)
ALT: 63 IU/L — AB (ref 0–32)
AST: 65 IU/L — AB (ref 0–40)
Albumin: 4.3 g/dL (ref 3.5–5.5)
Alkaline Phosphatase: 81 IU/L (ref 39–117)
BILIRUBIN TOTAL: 0.2 mg/dL (ref 0.0–1.2)
BUN/Creatinine Ratio: 17 (ref 9–23)
BUN: 9 mg/dL (ref 6–20)
CALCIUM: 9.2 mg/dL (ref 8.7–10.2)
CHLORIDE: 97 mmol/L (ref 96–106)
CO2: 22 mmol/L (ref 20–29)
Creatinine, Ser: 0.52 mg/dL — ABNORMAL LOW (ref 0.57–1.00)
GFR calc Af Amer: 159 mL/min/{1.73_m2} (ref >59)
GFR calc non Af Amer: 138 mL/min/{1.73_m2} (ref >59)
GLUCOSE: 325 mg/dL — AB (ref 65–99)
Globulin, Total: 3.2 g/dL (ref 1.5–4.5)
POTASSIUM: 4.5 mmol/L (ref 3.5–5.2)
Sodium: 136 mmol/L (ref 134–144)
TOTAL PROTEIN: 7.5 g/dL (ref 6.0–8.5)

## 2016-08-28 LAB — HEMOGLOBIN A1C
Est. average glucose Bld gHb Est-mCnc: 295 mg/dL
Hgb A1c MFr Bld: 11.9 % — ABNORMAL HIGH (ref 4.8–5.6)

## 2016-08-31 ENCOUNTER — Telehealth: Payer: Self-pay

## 2016-08-31 NOTE — Telephone Encounter (Signed)
Pt was notified to get refills from PCP. We have seen pt one time on 02-02-16. She was supposed to return in one week. She has had 3 no shows. She is asking if we can refill

## 2016-08-31 NOTE — Telephone Encounter (Signed)
No refills, she can take one sample of each insulin to cover until she gets her labs and reschedule her visit.

## 2016-08-31 NOTE — Telephone Encounter (Signed)
Pt notified but now states that she has insulin and now wants Gabapentin. I explained to her that she had to wait until she was seen.

## 2016-09-16 ENCOUNTER — Ambulatory Visit: Payer: Medicaid Other | Admitting: "Endocrinology

## 2016-09-17 ENCOUNTER — Ambulatory Visit: Payer: Medicaid Other | Admitting: "Endocrinology

## 2016-10-04 ENCOUNTER — Encounter: Payer: Medicaid Other | Attending: "Endocrinology | Admitting: Nutrition

## 2016-10-04 ENCOUNTER — Encounter: Payer: Self-pay | Admitting: "Endocrinology

## 2016-10-04 ENCOUNTER — Ambulatory Visit (INDEPENDENT_AMBULATORY_CARE_PROVIDER_SITE_OTHER): Payer: Medicaid Other | Admitting: "Endocrinology

## 2016-10-04 VITALS — BP 140/84 | HR 90 | Ht 65.0 in | Wt 221.0 lb

## 2016-10-04 VITALS — Ht 65.0 in | Wt 221.0 lb

## 2016-10-04 DIAGNOSIS — E669 Obesity, unspecified: Secondary | ICD-10-CM

## 2016-10-04 DIAGNOSIS — Z6835 Body mass index (BMI) 35.0-35.9, adult: Secondary | ICD-10-CM

## 2016-10-04 DIAGNOSIS — E118 Type 2 diabetes mellitus with unspecified complications: Secondary | ICD-10-CM

## 2016-10-04 DIAGNOSIS — E1165 Type 2 diabetes mellitus with hyperglycemia: Secondary | ICD-10-CM

## 2016-10-04 DIAGNOSIS — IMO0002 Reserved for concepts with insufficient information to code with codable children: Secondary | ICD-10-CM

## 2016-10-04 DIAGNOSIS — Z91199 Patient's noncompliance with other medical treatment and regimen due to unspecified reason: Secondary | ICD-10-CM | POA: Insufficient documentation

## 2016-10-04 DIAGNOSIS — Z794 Long term (current) use of insulin: Secondary | ICD-10-CM

## 2016-10-04 DIAGNOSIS — Z9119 Patient's noncompliance with other medical treatment and regimen: Secondary | ICD-10-CM | POA: Insufficient documentation

## 2016-10-04 MED ORDER — ENALAPRIL MALEATE 5 MG PO TABS: 5.0000 mg | ORAL_TABLET | Freq: Every day | ORAL | 2 refills | Status: DC

## 2016-10-04 MED ORDER — FLUCONAZOLE 150 MG PO TABS: 150.0000 mg | ORAL_TABLET | Freq: Every day | ORAL | 0 refills | Status: DC

## 2016-10-04 MED ORDER — METFORMIN HCL 1000 MG PO TABS: 1000.0000 mg | ORAL_TABLET | Freq: Two times a day (BID) | ORAL | 2 refills | Status: DC

## 2016-10-04 NOTE — Progress Notes (Signed)
Subjective:    Patient ID: Heather [name], female    DOB: [birth year]. Patient is being seen in f/u for management of diabetes requested by  Medicine, [facility]  Past Medical History:  Diagnosis Date  . ADHD (attention deficit hyperactivity disorder)   . Anemia   . Asthma    as a child  . Bipolar 1 disorder ([place])   . Diabetes mellitus    type 2  . Diabetic neuropathy ([place])   . Fatty liver   . GHTN vs. CHTN 06/11/2015  . Hidradenitis 06/05/2012  . History of hiatal hernia   . Irregular bleeding 06/05/2012  . Mononucleosis january 2014  . Nausea and vomiting 01/21/2015  . Obesity (BMI 30-39.9)   . Other and unspecified ovarian cyst 07/24/2012   Right  Ovarian cyst 8 cm on [place] 7/15 in ER  . Pneumonia   . Pregnant 01/21/2015  . Schizo affective schizophrenia ([place])   . Seizures ([place])    febrile seizures as a child  . Sleep apnea    no cpap   Past Surgical History:  Procedure Laterality Date  . ADENOIDECTOMY    . ANTERIOR CRUCIATE LIGAMENT REPAIR    . 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 TONSILLECTOMY    . WISDOM TOOTH EXTRACTION     Social History   Social History  . Marital status: Married    Spouse name: N/A  . Number of children: N/A  . Years of education: N/A   Social History Main Topics  . Smoking status: Never Smoker  . Smokeless tobacco: Never Used  . Alcohol use No  . Drug use: No  . Sexual activity: Not Currently    Birth control/ protection: None   Other Topics Concern  . None   Social History Narrative  . None   Outpatient Encounter Prescriptions as of 10/04/2016  Medication Sig  . enalapril (VASOTEC) 5 MG tablet Take 1 tablet (5 mg total) by mouth daily.  Marland Kitchen gabapentin (NEURONTIN) 100 MG capsule Take 1 capsule (100 mg total) by mouth 3 (three) times daily. (Patient taking differently: Take 300 mg by mouth 3 (three) times daily. )  . Insulin Glargine (LANTUS SOLOSTAR) 100  UNIT/ML Solostar Pen Inject 50 Units into the skin at bedtime.  . metFORMIN (GLUCOPHAGE) 1000 MG tablet Take 1 tablet (1,000 mg total) by mouth 2 (two) times daily with a meal.  . [DISCONTINUED] enalapril (VASOTEC) 5 MG tablet Take 5 mg by mouth daily.  . [DISCONTINUED] metFORMIN (GLUCOPHAGE) 1000 MG tablet Take 1 tablet (1,000 mg total) by mouth 2 (two) times daily with a meal.  . Blood Glucose Monitoring Suppl (ACCU-CHEK AVIVA) device Use as instructed  . glucose blood (ACCU-CHEK AVIVA) test strip Use to test glucose 4 times a day  . [DISCONTINUED] acetaminophen (TYLENOL) 500 MG tablet Take 1,000 mg by mouth every 6 (six) hours as needed for moderate pain or headache.  . [DISCONTINUED] clindamycin (CLINDAGEL) 1 % gel Apply topically 2 (two) times daily.  . [DISCONTINUED] doxycycline (VIBRAMYCIN) 100 MG capsule Take 1 capsule (100 mg total) by mouth 2 (two) times daily.  . [DISCONTINUED] enalapril (VASOTEC) 5 MG tablet Take 1 tablet (5 mg total) by mouth daily. (Patient not taking: Reported on 03/25/2016)  . [DISCONTINUED] ibuprofen (ADVIL,MOTRIN) 200 MG tablet Take 800 mg by mouth every 6 (six) hours as needed for moderate pain.  . [  DISCONTINUED] traMADol (ULTRAM) 50 MG tablet Take 1 tablet (50 mg total) by mouth every 6 (six) hours as needed.   Facility-Administered Encounter Medications as of 10/04/2016  Medication  . oxyCODONE-acetaminophen (PERCOCET/ROXICET) 5-325 MG per tablet 1 tablet   ALLERGIES: Allergies  Allergen Reactions  . Prozac [Fluoxetine Hcl]     Stiffness in neck, "stroke like" symptoms  . Cephalosporins Other (See Comments)    Unknown childhood reaction  . Latuda [Lurasidone Hcl]     Tongue, confusion    VACCINATION STATUS: Immunization History  Administered Date(s) Administered  . DTaP 08/15/1996, 10/16/1996, 12/19/1996, 02/13/1998, 07/18/2001  . H1N1 11/09/2007  . HPV Quadrivalent 11/03/2010, 05/17/2013  . Hepatitis A, Ped/Adol-2 Dose 05/17/2013  . Hepatitis B  Mar 02, 1996, 08/15/1996, 12/19/1996  . HiB (PRP-OMP) 08/15/1996, 10/16/1996, 02/13/1998  . IPV 08/15/1996, 10/16/1996, 02/13/1998, 07/18/2001  . Influenza Whole 11/03/2005, 11/15/2006, 11/09/2007, 09/13/2008, 09/12/2009, 11/03/2010  . Influenza,inj,Quad PF,6+ Mos 10/16/2013  . Influenza-Unspecified 10/23/2012  . MMR 07/15/1997, 07/18/2001  . Meningococcal Conjugate 09/13/2008  . Pneumococcal Conjugate-13 07/31/1999  . Td 08/22/2007  . Tdap 08/22/2007, 06/27/2014  . Varicella 07/15/1997, 09/13/2008    Diabetes  She presents for her follow-up diabetic visit. She has type 2 diabetes mellitus. Onset time: She was diagnosed at age 21 or 18 years. Her disease course has been worsening (She states that she was treated only with metformin until last year when she was pregnant. Due to loss of control with A1c of 8.5%, she is currently on basal/bolus insulin along with her metformin. She denies any history of DKA. She is 20 months postpartum ). There are no hypoglycemic associated symptoms. Pertinent negatives for hypoglycemia include no confusion, headaches, pallor or seizures. Associated symptoms include fatigue, foot paresthesias, polydipsia and polyuria. Pertinent negatives for diabetes include no chest pain and no polyphagia. There are no hypoglycemic complications. (Noncompliance/nonadherence. She was seen in January and was supposed to return in February. She is returning because her PMD would not refill her medications. Her A1c is higher at 11.9% from 8.5%.) Symptoms are worsening. Diabetic complications include peripheral neuropathy. Risk factors for coronary artery disease include diabetes mellitus, hypertension, obesity and sedentary lifestyle. Current diabetic treatment includes insulin injections (She is on Lantus 55 units daily at bedtime, NovoLog 10 units on average 3 times a day before meals, deformity 1000 mg by mouth twice a day.). Her weight is stable. She is following a generally unhealthy diet.  When asked about meal planning, she reported none. She has not had a previous visit with a dietitian. She rarely participates in exercise. Her overall blood glucose range is >200 mg/dl. (She has a log showing random monitoring of blood glucose.) An ACE inhibitor/angiotensin II receptor blocker is being taken. She does not see a podiatrist.Eye exam is not current.     Review of Systems  Constitutional: Positive for fatigue. Negative for chills, fever and unexpected weight change.  HENT: Negative for trouble swallowing and voice change.   Eyes: Negative for visual disturbance.  Respiratory: Negative for cough, shortness of breath and wheezing.   Cardiovascular: Negative for chest pain, palpitations and leg swelling.  Gastrointestinal: Negative for diarrhea, nausea and vomiting.  Endocrine: Positive for polydipsia and polyuria. Negative for cold intolerance, heat intolerance and polyphagia.  Musculoskeletal: Negative for arthralgias and myalgias.  Skin: Negative for color change, pallor, rash and wound.  Neurological: Negative for seizures and headaches.  Psychiatric/Behavioral: Negative for confusion and suicidal ideas.    Objective:    BP 140/84   Pulse  90   Ht 5' 5"  (1.651 m)   Wt 221 lb (100.2 kg)   BMI 36.78 kg/m   Wt Readings from Last 3 Encounters:  10/04/16 221 lb (100.2 kg)  05/20/16 225 lb (102.1 kg) (99 %, Z= 2.27)*  03/25/16 237 lb (107.5 kg) (>99 %, Z= 2.39)*   * Growth percentiles are based on CDC 2-20 Years data.    Physical Exam  Constitutional: She is oriented to person, place, and time. She appears well-developed.  HENT:  Head: Normocephalic and atraumatic.  Eyes: EOM are normal.  Neck: Normal range of motion. Neck supple. No tracheal deviation present. No thyromegaly present.  Cardiovascular: Normal rate and regular rhythm.   Pulmonary/Chest: Effort normal and breath sounds normal.  Abdominal: Soft. Bowel sounds are normal. There is no tenderness. There is no  guarding.  Musculoskeletal: Normal range of motion. She exhibits no edema.  Neurological: She is alert and oriented to person, place, and time. She has normal reflexes. No cranial nerve deficit. Coordination normal.  Skin: Skin is warm and dry. No rash noted. No erythema. No pallor.  Psychiatric: She has a normal mood and affect. Judgment normal.     CMP ( most recent) CMP     Component Value Date/Time   NA 136 08/27/2016 1012   K 4.5 08/27/2016 1012   CL 97 08/27/2016 1012   CO2 22 08/27/2016 1012   GLUCOSE 325 (H) 08/27/2016 1012   GLUCOSE 91 09/03/2015 2343   BUN 9 08/27/2016 1012   CREATININE 0.52 (L) 08/27/2016 1012   CREATININE 0.61 03/28/2012 1109   CALCIUM 9.2 08/27/2016 1012   PROT 7.5 08/27/2016 1012   ALBUMIN 4.3 08/27/2016 1012   AST 65 (H) 08/27/2016 1012   ALT 63 (H) 08/27/2016 1012   ALKPHOS 81 08/27/2016 1012   BILITOT 0.2 08/27/2016 1012   GFRNONAA 138 08/27/2016 1012   GFRNONAA >89 03/28/2012 1109   GFRAA 159 08/27/2016 1012   GFRAA >89 03/28/2012 1109     Diabetic Labs (most recent): Lab Results  Component Value Date   HGBA1C 11.9 (H) 08/27/2016   HGBA1C 8.5 12/23/2015   HGBA1C 6.3 (H) 03/19/2015     Lipid Panel ( most recent) Lipid Panel     Component Value Date/Time   CHOL 152 04/10/2013 0904   TRIG 158 (H) 04/10/2013 0904   HDL 34 (L) 04/10/2013 0904   CHOLHDL 4.5 04/10/2013 0904   VLDL 32 04/10/2013 0904   LDLCALC 86 04/10/2013 0904     Assessment & Plan:   1. Type 2 diabetes mellitus with complication, with long-term current use of insulin ([place]) -  She was seen in January 2018, was supposed to return in one week, never showed up.  - Patient has currently uncontrolled symptomatic type  ( likely 2) DM since  ?21 years of age,  with most recent A1c of  11.9% increasing from 8.5 %. Recent labs reviewed. - She reports peripheral neuropathy as a complication of diabetes, and patient remains at a high risk for more acute and chronic  complications of diabetes which include CAD, CVA, CKD, retinopathy, and neuropathy. These are all discussed in detail with the patient.  - I have counseled the patient on diet management and weight loss, by adopting a carbohydrate restricted/protein rich diet.  - Suggestion is made for patient to avoid simple carbohydrates   from their diet including Cakes , Desserts, Ice Cream,  Soda (  diet and regular) , Sweet Tea , Candies,  Chips, Cookies, Artificial Sweeteners,   and "Sugar-free" Products . This will help patient to have stable blood glucose profile and potentially avoid unintended weight gain.  - I encouraged the patient to switch to  unprocessed or minimally processed complex starch and increased protein intake (animal or plant source), fruits, and vegetables.  - Patient is advised to stick to a routine mealtimes to eat 3 meals  a day and avoid unnecessary snacks ( to snack only to correct hypoglycemia).   - I have approached patient with the following individualized plan to manage diabetes and patient agrees:   -  Given her body habitus, absence of DKA in her history, therapy with metformin with reasonable A1c until last year despite the fact that she was diagnosed at age 41, point to a possibility of type 2 versus type 1 diabetes. - Unfortunately patient disappeared from care and returns with A1c of 11.9%. - Regardless of the type of diabetes she has, she will need intensive treatment with basal/ bolus insulin to achieve control of diabetes to target.  - She will be tested with anti-pancreatic antibodies when she approaches target A1c of 7%.   - For now, I  will proceed to initiate  strict monitoring of glucose  4 times a day-before meals and at bedtime and   urged her to return in one week  with her meter and logs to review. - I will proceed to increase her Lantus to 50 units daily at bedtime. I advised her  it may be time to reinitiate NovoLog to help her achieve control of diabetes.   - I advised her to continue metformin 1000 mg by mouth twice a day.  - Patient specific target  A1c;  LDL, HDL, Triglycerides, and  Waist Circumference were discussed in detail.  2) BP/HTN: Controlled.  I lowered her enalapril 25 mg by mouth every morning. 3) Lipids/HPL:   Controlled, LDL of 86. No recent fasting lipid panel.  She is not on any statins for now. 4)  Weight/Diet: CDE Consult will be initiated , exercise, and detailed carbohydrates information provided.  5) Chronic Care/Health Maintenance:  -Patient is on ACEi medications and encouraged to continue to follow up with Ophthalmology, Podiatrist at least yearly or according to recommendations, and advised to   stay away from smoking. I have recommended yearly flu vaccine and pneumonia vaccinatio n at least every 5 years; moderate intensity exercise for up to 150 minutes weekly; and  sleep for at least 7 hours a day. - Due to her complaint of dysuria, I prescribed Diflucan 150 mg by mouth daily for 3 days and advised her to see PMD if symptoms continue.  - Time spent with the patient: 25 min, of which >50% was spent in reviewing her sugar logs , discussing her hypo- and hyper-glycemic episodes, reviewing her current and  previous labs and insulin doses and developing a plan to avoid hypo- and hyper-glycemia.   - I advised patient to maintain close follow up with Medicine, [facility] for primary care needs.  Follow up plan: - Return in about 1 week (around 10/11/2016) for follow up with meter and logs- no labs.  Glade Lloyd, MD Phone: [phone]  Fax: [phone]  This note was partially dictated with voice recognition software. Similar sounding words can be transcribed inadequately or may not  be corrected upon review.  10/04/2016, 3:08 PM

## 2016-10-04 NOTE — Patient Instructions (Signed)

## 2016-10-04 NOTE — Patient Instructions (Signed)
Goals Follow My Plate Eat 3 carb choices per meal Increase fresh fruits and vegetable. Make an appt with a Primary Care MD today-[facility] Drink only water  Eat meals on time. No snacks other than vegetables Keep a food journal and blood sugar log and bring back with you and meter.

## 2016-10-04 NOTE — Progress Notes (Signed)
  Medical Nutrition Therapy:  Appt start time: 1500 end time:  1530.   Assessment:  Primary concerns today: Type 1 vs Type 2. Sees Dr. Fransico Him today for Endocrinology. Will complete safety questions at next visit. Had it since 21 yrs old. Just had a baby 1 year ago that weighed 7 lbs 7 oz at 36 weeks. No reported complications. Lives with her husband and baby. Eat 3-4 times per day. Currently 50 units Lantus/Toujeo and Metformin 1000 mg BID.  Says she stays hungry all the time. Very thirsty. Has a yeast infection and needs treatment. Itches in private area a lot for 2 + months.   Has seen an eye MD. Needs to see a dentist. Reports she has a fatty liver and had elevated TG in the past. Hasn't taken insulin or meds for her DM in over a year. Current diet is insuffient to meet her needs. Drinks tons of sweet tea, soda and eats a lot of junk food. Eats 4-5 times a day. She notes she is ready to make changes to feel better and control her DM.  Preferred Learning Style:   No preference indicated   Learning Readiness:  Ready  Change in progress   MEDICATIONS:    DIETARY INTAKE:   24-hr recall:  B ( AM): skips or leftovers Snk ( AM):  SUndrop 12 oz L ( PM): Spaghettos  And doritos, Sweet tea,  Snk ( PM): D ( PM): 4pm: gilled salmon and squash,  Sweet tea and chicken nuggets and salad 11 pmShirmp, grilled, onions, mushrooms Snk ( PM):  Beverages: Soda, tea,   Usual physical activity: ADL.  Estimated energy needs: 1500  calories 170  g carbohydrates 112 g protein 42 g fat  Progress Towards Goal(s):  In progress.   Nutritional Diagnosis:  NB-1.1 Food and nutrition-related knowledge deficit As related to Diabetes Type 2.  As evidenced by A1C 11.9%.    Intervention:  Nutrition and Diabetes education provided on My Plate, CHO counting, meal planning, portion sizes, timing of meals, avoiding snacks between meals unless having a low blood sugar, target ranges for A1C and blood  sugars, signs/symptoms and treatment of hyper/hypoglycemia, monitoring blood sugars, taking medications as prescribed, benefits of exercising 30 minutes per day and prevention of complications of DM.   Goals Follow My Plate Eat 3 carb choices per meal Increase fresh fruits and vegetable. Make an appt with a Primary Care MD today-[facility] Drink only water-no soda, tea or juice  Eat meals on time. No snacks other than vegetables Keep a food journal and blood sugar log and bring back with you and meter.  Teaching Method Utilized:  Visual Auditory Hands on  Handouts given during visit include:  The Plate Methdd   Diabetes Instructions  Meal Plan Card   Barriers to learning/adherence to lifestyle change: none  Demonstrated degree of understanding via:  Teach Back   Monitoring/Evaluation:  Dietary intake, exercise, meal planning, sbg, and body weight in 1 week(s).

## 2016-10-12 ENCOUNTER — Encounter: Payer: Self-pay | Attending: "Endocrinology | Admitting: Nutrition

## 2016-10-12 ENCOUNTER — Ambulatory Visit (INDEPENDENT_AMBULATORY_CARE_PROVIDER_SITE_OTHER): Payer: Medicaid Other | Admitting: "Endocrinology

## 2016-10-12 ENCOUNTER — Encounter: Payer: Self-pay | Admitting: "Endocrinology

## 2016-10-12 VITALS — BP 131/76 | HR 65 | Ht 65.0 in | Wt 225.0 lb

## 2016-10-12 DIAGNOSIS — Z6835 Body mass index (BMI) 35.0-35.9, adult: Secondary | ICD-10-CM | POA: Diagnosis not present

## 2016-10-12 DIAGNOSIS — I10 Essential (primary) hypertension: Secondary | ICD-10-CM

## 2016-10-12 MED ORDER — INSULIN ASPART 100 UNIT/ML FLEXPEN: 10.0000 [IU] | PEN_INJECTOR | Freq: Three times a day (TID) | SUBCUTANEOUS | 2 refills | Status: DC

## 2016-10-12 MED ORDER — INSULIN GLARGINE 300 UNIT/ML ~~LOC~~ SOPN: 50.0000 [IU] | PEN_INJECTOR | Freq: Every day | SUBCUTANEOUS | 2 refills | Status: DC

## 2016-10-12 MED ORDER — GLUCOSE BLOOD VI STRP
ORAL_STRIP | 2 refills | Status: DC
Start: 2016-10-12 — End: 2017-07-18

## 2016-10-12 NOTE — Patient Instructions (Signed)

## 2016-10-12 NOTE — Progress Notes (Signed)
Subjective:    Patient ID: Heather [name], female    DOB: 1995/11/26. Patient is being seen in f/u for management of diabetes requested by  Medicine, [facility]  Past Medical History:  Diagnosis Date  . ADHD (attention deficit hyperactivity disorder)   . Anemia   . Asthma    as a child  . Bipolar 1 disorder ([place])   . Diabetes mellitus    type 2  . Diabetic neuropathy ([place])   . Fatty liver   . GHTN vs. CHTN 06/11/2015  . Hidradenitis 06/05/2012  . History of hiatal hernia   . Irregular bleeding 06/05/2012  . Mononucleosis january 2014  . Nausea and vomiting 01/21/2015  . Obesity (BMI 30-39.9)   . Other and unspecified ovarian cyst 07/24/2012   Right  Ovarian cyst 8 cm on [place] 7/15 in ER  . Pneumonia   . Pregnant 01/21/2015  . Schizo affective schizophrenia ([place])   . Seizures ([place])    febrile seizures as a child  . Sleep apnea    no cpap   Past Surgical History:  Procedure Laterality Date  . ADENOIDECTOMY    . ANTERIOR CRUCIATE LIGAMENT REPAIR    . 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 TONSILLECTOMY    . WISDOM TOOTH EXTRACTION     Social History   Social History  . Marital status: Married    Spouse name: N/A  . Number of children: N/A  . Years of education: N/A   Social History Main Topics  . Smoking status: Never Smoker  . Smokeless tobacco: Never Used  . Alcohol use No  . Drug use: No  . Sexual activity: Not Currently    Birth control/ protection: None   Other Topics Concern  . None   Social History Narrative  . None   Outpatient Encounter Prescriptions as of 10/12/2016  Medication Sig  . Blood Glucose Monitoring Suppl (ACCU-CHEK AVIVA) device Use as instructed  . enalapril (VASOTEC) 5 MG tablet Take 1 tablet (5 mg total) by mouth daily.  . fluconazole (DIFLUCAN) 150 MG tablet Take 1 tablet (150 mg total) by mouth daily.  [place] Kitchen gabapentin (NEURONTIN) 100 MG capsule Take 1 capsule (100  mg total) by mouth 3 (three) times daily. (Patient taking differently: Take 300 mg by mouth 3 (three) times daily. )  . glucose blood (ACCU-CHEK AVIVA) test strip Use to test glucose 4 times a day  . glucose blood (ACCU-CHEK GUIDE) test strip Use as instructed  . insulin aspart (NOVOLOG FLEXPEN) 100 UNIT/ML FlexPen Inject 10-16 Units into the skin 3 (three) times daily with meals.  . Insulin Glargine (TOUJEO MAX SOLOSTAR) 300 UNIT/ML SOPN Inject 50 Units into the skin at bedtime.  . metFORMIN (GLUCOPHAGE) 1000 MG tablet Take 1 tablet (1,000 mg total) by mouth 2 (two) times daily with a meal.  . [DISCONTINUED] Insulin Glargine (LANTUS SOLOSTAR) 100 UNIT/ML Solostar Pen Inject 50 Units into the skin at bedtime.   Facility-Administered Encounter Medications as of 10/12/2016  Medication  . oxyCODONE-acetaminophen (PERCOCET/ROXICET) 5-325 MG per tablet 1 tablet   ALLERGIES: Allergies  Allergen Reactions  . Prozac [Fluoxetine Hcl]     Stiffness in neck, "stroke like" symptoms  . Cephalosporins Other (See Comments)    Unknown childhood reaction  . Latuda [Lurasidone Hcl]     Tongue, confusion    VACCINATION STATUS: Immunization History  Administered Date(s)  Administered  . DTaP 08/15/1996, 10/16/1996, 12/19/1996, 02/13/1998, 07/18/2001  . H1N1 11/09/2007  . HPV Quadrivalent 11/03/2010, 05/17/2013  . Hepatitis A, Ped/Adol-2 Dose 05/17/2013  . Hepatitis B 07/24/96, 08/15/1996, 12/19/1996  . HiB (PRP-OMP) 08/15/1996, 10/16/1996, 02/13/1998  . IPV 08/15/1996, 10/16/1996, 02/13/1998, 07/18/2001  . Influenza Whole 11/03/2005, 11/15/2006, 11/09/2007, 09/13/2008, 09/12/2009, 11/03/2010  . Influenza,inj,Quad PF,6+ Mos 10/16/2013  . Influenza-Unspecified 10/23/2012  . MMR 07/15/1997, 07/18/2001  . Meningococcal Conjugate 09/13/2008  . Pneumococcal Conjugate-13 07/31/1999  . Td 08/22/2007  . Tdap 08/22/2007, 06/27/2014  . Varicella 07/15/1997, 09/13/2008    Diabetes  She presents for her  follow-up diabetic visit. She has type 2 diabetes mellitus. Onset time: She was diagnosed at age 69 or 10 years. Her disease course has been worsening (She states that she was treated only with metformin until last year when she was pregnant. Due to loss of control with A1c of 8.5%, she is currently on basal/bolus insulin along with her metformin. She denies any history of DKA. She is 4 months postpartum ). There are no hypoglycemic associated symptoms. Pertinent negatives for hypoglycemia include no confusion, headaches, pallor or seizures. Associated symptoms include fatigue, foot paresthesias, polydipsia and polyuria. Pertinent negatives for diabetes include no chest pain and no polyphagia. There are no hypoglycemic complications. (Noncompliance/nonadherence. She was seen in January and was supposed to return in February. She is returning because her PMD would not refill her medications. Her A1c is higher at 11.9% from 8.5%.) Symptoms are worsening. Diabetic complications include peripheral neuropathy. Risk factors for coronary artery disease include diabetes mellitus, hypertension, obesity and sedentary lifestyle. Current diabetic treatment includes insulin injections (She is on Lantus 55 units daily at bedtime, NovoLog 10 units on average 3 times a day before meals, deformity 1000 mg by mouth twice a day.). Her weight is increasing steadily. She is following a generally unhealthy diet. When asked about meal planning, she reported none. She has not had a previous visit with a dietitian. She rarely participates in exercise. Her breakfast blood glucose range is generally >200 mg/dl. Her lunch blood glucose range is generally >200 mg/dl. Her dinner blood glucose range is generally >200 mg/dl. Her bedtime blood glucose range is generally >200 mg/dl. Her overall blood glucose range is >200 mg/dl. An ACE inhibitor/angiotensin II receptor blocker is being taken. She does not see a podiatrist.Eye exam is not current.      Review of Systems  Constitutional: Positive for fatigue. Negative for chills, fever and unexpected weight change.  HENT: Negative for trouble swallowing and voice change.   Eyes: Negative for visual disturbance.  Respiratory: Negative for cough, shortness of breath and wheezing.   Cardiovascular: Negative for chest pain, palpitations and leg swelling.  Gastrointestinal: Negative for diarrhea, nausea and vomiting.  Endocrine: Positive for polydipsia and polyuria. Negative for cold intolerance, heat intolerance and polyphagia.  Musculoskeletal: Negative for arthralgias and myalgias.  Skin: Negative for color change, pallor, rash and wound.  Neurological: Negative for seizures and headaches.  Psychiatric/Behavioral: Negative for confusion and suicidal ideas.    Objective:    BP 131/76   Pulse 65   Ht 5' 5"  (1.651 m)   Wt 225 lb (102.1 kg)   BMI 37.44 kg/m   Wt Readings from Last 3 Encounters:  10/12/16 225 lb (102.1 kg)  10/04/16 221 lb (100.2 kg)  10/04/16 221 lb (100.2 kg)    Physical Exam  Constitutional: She is oriented to person, place, and time. She appears well-developed.  HENT:  Head:  Normocephalic and atraumatic.  Eyes: EOM are normal.  Neck: Normal range of motion. Neck supple. No tracheal deviation present. No thyromegaly present.  Cardiovascular: Normal rate and regular rhythm.   Pulmonary/Chest: Effort normal and breath sounds normal.  Abdominal: Soft. Bowel sounds are normal. There is no tenderness. There is no guarding.  Musculoskeletal: Normal range of motion. She exhibits no edema.  Neurological: She is alert and oriented to person, place, and time. She has normal reflexes. No cranial nerve deficit. Coordination normal.  Skin: Skin is warm and dry. No rash noted. No erythema. No pallor.  Psychiatric: She has a normal mood and affect. Judgment normal.    CMP     Component Value Date/Time   NA 136 08/27/2016 1012   K 4.5 08/27/2016 1012   CL 97  08/27/2016 1012   CO2 22 08/27/2016 1012   GLUCOSE 325 (H) 08/27/2016 1012   GLUCOSE 91 09/03/2015 2343   BUN 9 08/27/2016 1012   CREATININE 0.52 (L) 08/27/2016 1012   CREATININE 0.61 03/28/2012 1109   CALCIUM 9.2 08/27/2016 1012   PROT 7.5 08/27/2016 1012   ALBUMIN 4.3 08/27/2016 1012   AST 65 (H) 08/27/2016 1012   ALT 63 (H) 08/27/2016 1012   ALKPHOS 81 08/27/2016 1012   BILITOT 0.2 08/27/2016 1012   GFRNONAA 138 08/27/2016 1012   GFRNONAA >89 03/28/2012 1109   GFRAA 159 08/27/2016 1012   GFRAA >89 03/28/2012 1109     Diabetic Labs (most recent): Lab Results  Component Value Date   HGBA1C 11.9 (H) 08/27/2016   HGBA1C 8.5 12/23/2015   HGBA1C 6.3 (H) 03/19/2015     Lipid Panel ( most recent) Lipid Panel     Component Value Date/Time   CHOL 152 04/10/2013 0904   TRIG 158 (H) 04/10/2013 0904   HDL 34 (L) 04/10/2013 0904   CHOLHDL 4.5 04/10/2013 0904   VLDL 32 04/10/2013 0904   LDLCALC 86 04/10/2013 0904     Assessment & Plan:   1. Type 2 diabetes mellitus with complication, with long-term current use of insulin ([place]) - she returns with persistently above target blood glucose profile averaging 200 pounds, despite 50 units of Toujeo and metformin 1 g by mouth twice a day. - Patient has currently uncontrolled symptomatic type  ( likely 2) DM since  ?21 years of age,  with most recent A1c of  11.9% increasing from 8.5 %. Recent labs reviewed. - She reports peripheral neuropathy as a complication of diabetes, and patient remains at a high risk for more acute and chronic complications of diabetes which include CAD, CVA, CKD, retinopathy, and neuropathy. These are all discussed in detail with the patient.  - I have counseled the patient on diet management and weight loss, by adopting a carbohydrate restricted/protein rich diet.  -  Suggestion is made for her to avoid simple carbohydrates  from her diet including Cakes, Sweet Desserts / Pastries, Ice Cream, Soda (diet and  regular), Sweet Tea, Candies, Chips, Cookies, Store Bought Juices, Alcohol in Excess of  1-2 drinks a day, Artificial Sweeteners, and "Sugar-free" Products. This will help patient to have stable blood glucose profile and potentially avoid unintended weight gain.   - I encouraged the patient to switch to  unprocessed or minimally processed complex starch and increased protein intake (animal or plant source), fruits, and vegetables.  - Patient is advised to stick to a routine mealtimes to eat 3 meals  a day and avoid unnecessary snacks ( to snack only  to correct hypoglycemia).   - I have approached patient with the following individualized plan to manage diabetes and patient agrees:   -  Given her body habitus, absence of DKA in her history, therapy with metformin with reasonable A1c until last year despite the fact that she was diagnosed at age 66, point to a possibility of type 2 versus type 1 diabetes. - Regardless of the type of diabetes she has, she will need intensive treatment with basal/ bolus insulin to achieve control of diabetes to target. - Unfortunately, patient has a habit of disappearing from care .  - For now, I  will proceed with basal insulin Toujeo 50 units daily at bedtime, initiate prandial insulin NovoLog 10 units 3 times a day before meals for pre-meal blood glucose above 90 mg/dL (additional correction dose for pre-meal blood glucose above 150 g/dL),  associated with strict monitoring of glucose  4 times a day-before meals and at bedtime and   urged her to return in one week  with her meter and logs to review.  - I advised her to continue metformin 1000 mg by mouth twice a day. - She will be tested with anti-pancreatic antibodies when she approaches target A1c of 7%. - Patient specific target  A1c;  LDL, HDL, Triglycerides, and  Waist Circumference were discussed in detail.  2) BP/HTN: Controlled.  I lowered her enalapril 25 mg by mouth every morning. 3) Lipids/HPL:    Controlled, LDL of 86. No recent fasting lipid panel.  She is not on any statins for now. 4)  Weight/Diet: CDE Consult will be initiated , exercise, and detailed carbohydrates information provided.  5) Chronic Care/Health Maintenance:  -Patient is on ACEi medications and encouraged to continue to follow up with Ophthalmology, Podiatrist at least yearly or according to recommendations, and advised to   stay away from smoking. I have recommended yearly flu vaccine and pneumonia vaccinatio n at least every 5 years; moderate intensity exercise for up to 150 minutes weekly; and  sleep for at least 7 hours a day.  - Time spent with the patient: 25 min, of which >50% was spent in reviewing her sugar logs , discussing her hypo- and hyper-glycemic episodes, reviewing her current and  previous labs and insulin doses and developing a plan to avoid hypo- and hyper-glycemia.    - I advised patient to maintain close follow up with Medicine, [facility] for primary care needs.  Follow up plan: - Return in about 1 week (around 10/19/2016) for follow up with meter and logs- no labs.  Glade Lloyd, MD Phone: [phone]  Fax: [phone]  This note was partially dictated with voice recognition software. Similar sounding words can be transcribed inadequately or may not  be corrected upon review.  10/12/2016, 12:26 PM

## 2016-10-13 ENCOUNTER — Other Ambulatory Visit: Payer: Self-pay

## 2016-10-13 MED ORDER — INSULIN GLARGINE 100 UNIT/ML SOLOSTAR PEN: 50.0000 [IU] | PEN_INJECTOR | Freq: Every day | SUBCUTANEOUS | 2 refills | Status: DC

## 2016-10-14 ENCOUNTER — Other Ambulatory Visit: Payer: Self-pay | Admitting: "Endocrinology

## 2016-10-14 ENCOUNTER — Telehealth: Payer: Self-pay | Admitting: "Endocrinology

## 2016-10-14 ENCOUNTER — Encounter: Payer: Self-pay | Admitting: Adult Health

## 2016-10-14 ENCOUNTER — Ambulatory Visit (INDEPENDENT_AMBULATORY_CARE_PROVIDER_SITE_OTHER): Payer: Medicaid Other | Admitting: Adult Health

## 2016-10-14 VITALS — BP 160/80 | HR 80 | Ht 65.0 in | Wt 224.0 lb

## 2016-10-14 DIAGNOSIS — O24911 Unspecified diabetes mellitus in pregnancy, first trimester: Secondary | ICD-10-CM | POA: Insufficient documentation

## 2016-10-14 DIAGNOSIS — E119 Type 2 diabetes mellitus without complications: Secondary | ICD-10-CM

## 2016-10-14 DIAGNOSIS — N926 Irregular menstruation, unspecified: Secondary | ICD-10-CM | POA: Diagnosis not present

## 2016-10-14 DIAGNOSIS — O09899 Supervision of other high risk pregnancies, unspecified trimester: Secondary | ICD-10-CM

## 2016-10-14 DIAGNOSIS — O09219 Supervision of pregnancy with history of pre-term labor, unspecified trimester: Secondary | ICD-10-CM

## 2016-10-14 DIAGNOSIS — Z3A10 10 weeks gestation of pregnancy: Secondary | ICD-10-CM | POA: Insufficient documentation

## 2016-10-14 DIAGNOSIS — O3680X0 Pregnancy with inconclusive fetal viability, not applicable or unspecified: Secondary | ICD-10-CM

## 2016-10-14 DIAGNOSIS — I10 Essential (primary) hypertension: Secondary | ICD-10-CM

## 2016-10-14 DIAGNOSIS — Z3201 Encounter for pregnancy test, result positive: Secondary | ICD-10-CM | POA: Diagnosis not present

## 2016-10-14 LAB — POCT URINE PREGNANCY: PREG TEST UR: POSITIVE — AB

## 2016-10-14 MED ORDER — LABETALOL HCL 200 MG PO TABS: 200.0000 mg | ORAL_TABLET | Freq: Two times a day (BID) | ORAL | 6 refills | Status: DC

## 2016-10-14 MED ORDER — INSULIN DETEMIR 100 UNIT/ML FLEXPEN: 50.0000 [IU] | PEN_INJECTOR | Freq: Every day | SUBCUTANEOUS | 6 refills | Status: DC

## 2016-10-14 MED ORDER — PRENATAL PLUS 27-1 MG PO TABS: 1.0000 | ORAL_TABLET | Freq: Every day | ORAL | 12 refills | Status: DC

## 2016-10-14 NOTE — Patient Instructions (Signed)
First Trimester of Pregnancy The first trimester of pregnancy is from week 1 until the end of week 13 (months 1 through 3). A week after a sperm fertilizes an egg, the egg will implant on the wall of the uterus. This embryo will begin to develop into a baby. Genes from you and your partner will form the baby. The female genes will determine whether the baby will be a boy or a girl. At 6-8 weeks, the eyes and face will be formed, and the heartbeat can be seen on ultrasound. At the end of 12 weeks, all the baby's organs will be formed. Now that you are pregnant, you will want to do everything you can to have a healthy baby. Two of the most important things are to get good prenatal care and to follow your health care provider's instructions. Prenatal care is all the medical care you receive before the baby's birth. This care will help prevent, find, and treat any problems during the pregnancy and childbirth. Body changes during your first trimester Your body goes through many changes during pregnancy. The changes vary from woman to woman.  You may gain or lose a couple of pounds at first.  You may feel sick to your stomach (nauseous) and you may throw up (vomit). If the vomiting is uncontrollable, call your health care provider.  You may tire easily.  You may develop headaches that can be relieved by medicines. All medicines should be approved by your health care provider.  You may urinate more often. Painful urination may mean you have a bladder infection.  You may develop heartburn as a result of your pregnancy.  You may develop constipation because certain hormones are causing the muscles that push stool through your intestines to slow down.  You may develop hemorrhoids or swollen veins (varicose veins).  Your breasts may begin to grow larger and become tender. Your nipples may stick out more, and the tissue that surrounds them (areola) may become darker.  Your gums may bleed and may be  sensitive to brushing and flossing.  Dark spots or blotches (chloasma, mask of pregnancy) may develop on your face. This will likely fade after the baby is born.  Your menstrual periods will stop.  You may have a loss of appetite.  You may develop cravings for certain kinds of food.  You may have changes in your emotions from day to day, such as being excited to be pregnant or being concerned that something may go wrong with the pregnancy and baby.  You may have more vivid and strange dreams.  You may have changes in your hair. These can include thickening of your hair, rapid growth, and changes in texture. Some women also have hair loss during or after pregnancy, or hair that feels dry or thin. Your hair will most likely return to normal after your baby is born.  What to expect at prenatal visits During a routine prenatal visit:  You will be weighed to make sure you and the baby are growing normally.  Your blood pressure will be taken.  Your abdomen will be measured to track your baby's growth.  The fetal heartbeat will be listened to between weeks 10 and 14 of your pregnancy.  Test results from any previous visits will be discussed.  Your health care provider may ask you:  How you are feeling.  If you are feeling the baby move.  If you have had any abnormal symptoms, such as leaking fluid, bleeding, severe headaches,   or abdominal cramping.  If you are using any tobacco products, including cigarettes, chewing tobacco, and electronic cigarettes.  If you have any questions.  Other tests that may be performed during your first trimester include:  Blood tests to find your blood type and to check for the presence of any previous infections. The tests will also be used to check for low iron levels (anemia) and protein on red blood cells (Rh antibodies). Depending on your risk factors, or if you previously had diabetes during pregnancy, you may have tests to check for high blood  sugar that affects pregnant women (gestational diabetes).  Urine tests to check for infections, diabetes, or protein in the urine.  An ultrasound to confirm the proper growth and development of the baby.  Fetal screens for spinal cord problems (spina bifida) and Down syndrome.  HIV (human immunodeficiency virus) testing. Routine prenatal testing includes screening for HIV, unless you choose not to have this test.  You may need other tests to make sure you and the baby are doing well.  Follow these instructions at home: Medicines  Follow your health care provider's instructions regarding medicine use. Specific medicines may be either safe or unsafe to take during pregnancy.  Take a prenatal vitamin that contains at least 600 micrograms (mcg) of folic acid.  If you develop constipation, try taking a stool softener if your health care provider approves. Eating and drinking  Eat a balanced diet that includes fresh fruits and vegetables, whole grains, good sources of protein such as meat, eggs, or tofu, and low-fat dairy. Your health care provider will help you determine the amount of weight gain that is right for you.  Avoid raw meat and uncooked cheese. These carry germs that can cause birth defects in the baby.  Eating four or five small meals rather than three large meals a day may help relieve nausea and vomiting. If you start to feel nauseous, eating a few soda crackers can be helpful. Drinking liquids between meals, instead of during meals, also seems to help ease nausea and vomiting.  Limit foods that are high in fat and processed sugars, such as fried and sweet foods.  To prevent constipation: ? Eat foods that are high in fiber, such as fresh fruits and vegetables, whole grains, and beans. ? Drink enough fluid to keep your urine clear or pale yellow. Activity  Exercise only as directed by your health care provider. Most women can continue their usual exercise routine during  pregnancy. Try to exercise for 30 minutes at least 5 days a week. Exercising will help you: ? Control your weight. ? Stay in shape. ? Be prepared for labor and delivery.  Experiencing pain or cramping in the lower abdomen or lower back is a good sign that you should stop exercising. Check with your health care provider before continuing with normal exercises.  Try to avoid standing for long periods of time. Move your legs often if you must stand in one place for a long time.  Avoid heavy lifting.  Wear low-heeled shoes and practice good posture.  You may continue to have sex unless your health care provider tells you not to. Relieving pain and discomfort  Wear a good support bra to relieve breast tenderness.  Take warm sitz baths to soothe any pain or discomfort caused by hemorrhoids. Use hemorrhoid cream if your health care provider approves.  Rest with your legs elevated if you have leg cramps or low back pain.  If you develop   varicose veins in your legs, wear support hose. Elevate your feet for 15 minutes, 3-4 times a day. Limit salt in your diet. Prenatal care  Schedule your prenatal visits by the twelfth week of pregnancy. They are usually scheduled monthly at first, then more often in the last 2 months before delivery.  Write down your questions. Take them to your prenatal visits.  Keep all your prenatal visits as told by your health care provider. This is important. Safety  Wear your seat belt at all times when driving.  Make a list of emergency phone numbers, including numbers for family, friends, the hospital, and police and fire departments. General instructions  Ask your health care provider for a referral to a local prenatal education class. Begin classes no later than the beginning of month 6 of your pregnancy.  Ask for help if you have counseling or nutritional needs during pregnancy. Your health care provider can offer advice or refer you to specialists for help  with various needs.  Do not use hot tubs, steam rooms, or saunas.  Do not douche or use tampons or scented sanitary pads.  Do not cross your legs for long periods of time.  Avoid cat litter boxes and soil used by cats. These carry germs that can cause birth defects in the baby and possibly loss of the fetus by miscarriage or stillbirth.  Avoid all smoking, herbs, alcohol, and medicines not prescribed by your health care provider. Chemicals in these products affect the formation and growth of the baby.  Do not use any products that contain nicotine or tobacco, such as cigarettes and e-cigarettes. If you need help quitting, ask your health care provider. You may receive counseling support and other resources to help you quit.  Schedule a dentist appointment. At home, brush your teeth with a soft toothbrush and be gentle when you floss. Contact a health care provider if:  You have dizziness.  You have mild pelvic cramps, pelvic pressure, or nagging pain in the abdominal area.  You have persistent nausea, vomiting, or diarrhea.  You have a bad smelling vaginal discharge.  You have pain when you urinate.  You notice increased swelling in your face, hands, legs, or ankles.  You are exposed to fifth disease or chickenpox.  You are exposed to German measles (rubella) and have never had it. Get help right away if:  You have a fever.  You are leaking fluid from your vagina.  You have spotting or bleeding from your vagina.  You have severe abdominal cramping or pain.  You have rapid weight gain or loss.  You vomit blood or material that looks like coffee grounds.  You develop a severe headache.  You have shortness of breath.  You have any kind of trauma, such as from a fall or a car accident. Summary  The first trimester of pregnancy is from week 1 until the end of week 13 (months 1 through 3).  Your body goes through many changes during pregnancy. The changes vary from  woman to woman.  You will have routine prenatal visits. During those visits, your health care provider will examine you, discuss any test results you may have, and talk with you about how you are feeling. This information is not intended to replace advice given to you by your health care provider. Make sure you discuss any questions you have with your health care provider. Document Released: 12/15/2000 Document Revised: 12/03/2015 Document Reviewed: 12/03/2015 Elsevier Interactive Patient Education  2017 [employer]  

## 2016-10-14 NOTE — Progress Notes (Signed)
Subjective:     Patient ID: Heather [name], female   DOB: 04-04-1995, 21 y.o.   MRN: [id number]  HPI Heather [name] is a 21 year old white female in for UPT, she has missed 3 periods and had +HPT today and some spotting, can't remember if after sex or not. She had vaginal delivery 09/05/15 at 36+2 weeks.She is seeing Dr Fransico Him for diabetes management.   Review of Systems +missed periods, had +HPT today Some spotting  Reviewed past medical,surgical, social and family history. Reviewed medications and allergies.     Objective:   Physical Exam BP (!) 160/80 (BP Location: Left Arm, Patient Position: Sitting, Cuff Size: Small)   Pulse 80   Ht  (1.651 m)   Wt 224 lb (101.6 kg)   LMP 08/04/2016   BMI 37.28 kg/m UPT +, about 10 weeks by LMP, with EDD 05/12/17.Skin warm and dry. Neck: mid line trachea, normal thyroid, good ROM, no lymphadenopathy noted. Lungs: clear to ausculation bilaterally. Cardiovascular: regular rate and rhythm.Abdomen is soft and non tender.     Assessment:     1. Pregnancy test positive   2. [redacted] weeks gestation of pregnancy   3. Encounter to determine fetal viability of pregnancy, single or unspecified fetus   4. Essential hypertension   5. History of preterm delivery, currently pregnant   6. 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 labetalol (NORMODYNE) 200 MG tablet    Sig: Take 1 tablet (200 mg total) by mouth 2 (two) times daily.    Dispense:  60 tablet    Refill:  6    Order Specific Question:   Supervising Provider    Answer:   Despina Hidden, LUTHER H [[id number]]  . prenatal vitamin w/FE, FA (PRENATAL 1 + 1) 27-1 MG TABS tablet    Sig: Take 1 tablet by mouth daily at 12 noon.    Dispense:  30 each    Refill:  12    Order Specific Question:   Supervising Provider    Answer:   Lazaro Arms [[id number]]  Return in 4 days for dating [place] Keep appt with Dr Fransico Him for Monday Review handout on first trimester  and by [facility]

## 2016-10-14 NOTE — Telephone Encounter (Signed)
Heather [name] is stating that she found out yesterday shes pregnant again and shes asking about how to adjust her medications, please advise?

## 2016-10-14 NOTE — Telephone Encounter (Signed)
She needs to change her Toujeo to Levemir - same dose,  Keep Novolog, and has to return to office on Monday with meter and logs.

## 2016-10-15 ENCOUNTER — Ambulatory Visit: Payer: Self-pay | Admitting: Family Medicine

## 2016-10-15 ENCOUNTER — Ambulatory Visit: Payer: Medicaid Other | Admitting: Adult Health

## 2016-10-15 ENCOUNTER — Other Ambulatory Visit: Payer: Self-pay | Admitting: Adult Health

## 2016-10-15 DIAGNOSIS — O3680X0 Pregnancy with inconclusive fetal viability, not applicable or unspecified: Secondary | ICD-10-CM

## 2016-10-18 ENCOUNTER — Other Ambulatory Visit: Payer: Self-pay | Admitting: Adult Health

## 2016-10-18 ENCOUNTER — Ambulatory Visit (INDEPENDENT_AMBULATORY_CARE_PROVIDER_SITE_OTHER): Payer: Medicaid Other | Admitting: Obstetrics & Gynecology

## 2016-10-18 ENCOUNTER — Ambulatory Visit (INDEPENDENT_AMBULATORY_CARE_PROVIDER_SITE_OTHER): Payer: Medicaid Other

## 2016-10-18 ENCOUNTER — Ambulatory Visit (INDEPENDENT_AMBULATORY_CARE_PROVIDER_SITE_OTHER): Payer: Medicaid Other | Admitting: "Endocrinology

## 2016-10-18 ENCOUNTER — Ambulatory Visit: Payer: Medicaid Other

## 2016-10-18 ENCOUNTER — Encounter: Payer: Self-pay | Admitting: Obstetrics & Gynecology

## 2016-10-18 VITALS — BP 124/82 | HR 92 | Ht 65.0 in | Wt 225.0 lb

## 2016-10-18 VITALS — BP 110/60 | HR 102 | Ht 65.0 in

## 2016-10-18 DIAGNOSIS — O039 Complete or unspecified spontaneous abortion without complication: Secondary | ICD-10-CM | POA: Diagnosis not present

## 2016-10-18 DIAGNOSIS — O3680X0 Pregnancy with inconclusive fetal viability, not applicable or unspecified: Secondary | ICD-10-CM | POA: Diagnosis not present

## 2016-10-18 DIAGNOSIS — O24811 Other pre-existing diabetes mellitus in pregnancy, first trimester: Secondary | ICD-10-CM

## 2016-10-18 DIAGNOSIS — Z3A10 10 weeks gestation of pregnancy: Secondary | ICD-10-CM | POA: Diagnosis not present

## 2016-10-18 DIAGNOSIS — O24819 Other pre-existing diabetes mellitus in pregnancy, unspecified trimester: Secondary | ICD-10-CM

## 2016-10-18 MED ORDER — INSULIN ASPART 100 UNIT/ML FLEXPEN: 15.0000 [IU] | PEN_INJECTOR | Freq: Three times a day (TID) | SUBCUTANEOUS | 2 refills | Status: DC

## 2016-10-18 MED ORDER — HYDROCODONE-ACETAMINOPHEN 5-325 MG PO TABS: 1.0000 | ORAL_TABLET | Freq: Four times a day (QID) | ORAL | 0 refills | Status: DC | PRN

## 2016-10-18 MED ORDER — INSULIN DETEMIR 100 UNIT/ML FLEXPEN: 60.0000 [IU] | PEN_INJECTOR | Freq: Every day | SUBCUTANEOUS | 6 refills | Status: DC

## 2016-10-18 NOTE — Progress Notes (Signed)
Subjective:    Patient ID: Heather [name], female    DOB: [birth year]. Patient is being seen in f/u for management of diabetes requested by  Medicine, [facility]  Past Medical History:  Diagnosis Date  . ADHD (attention deficit hyperactivity disorder)   . Anemia   . Asthma    as a child  . Bipolar 1 disorder ([place])   . Diabetes mellitus    type 2  . Diabetic neuropathy ([place])   . Fatty liver   . GHTN vs. CHTN 06/11/2015  . Hidradenitis 06/05/2012  . History of hiatal hernia   . Irregular bleeding 06/05/2012  . Mononucleosis january 2014  . Nausea and vomiting 01/21/2015  . Obesity (BMI 30-39.9)   . Other and unspecified ovarian cyst 07/24/2012   Right  Ovarian cyst 8 cm on [place] 7/15 in ER  . Pneumonia   . Pregnant 01/21/2015  . Schizo affective schizophrenia ([place])   . Seizures ([place])    febrile seizures as a child  . Sleep apnea    no cpap   Past Surgical History:  Procedure Laterality Date  . ADENOIDECTOMY    . ANTERIOR CRUCIATE LIGAMENT REPAIR    . 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 TONSILLECTOMY    . WISDOM TOOTH EXTRACTION     Social History   Social History  . Marital status: Married    Spouse name: N/A  . Number of children: N/A  . Years of education: N/A   Social History Main Topics  . Smoking status: Never Smoker  . Smokeless tobacco: Never Used  . Alcohol use No  . Drug use: No  . Sexual activity: Yes    Birth control/ protection: None   Other Topics Concern  . Not on file   Social History Narrative  . No narrative on file   Outpatient Encounter Prescriptions as of 10/18/2016  Medication Sig  . Insulin Detemir (LEVEMIR FLEXTOUCH) 100 UNIT/ML Pen Inject 60 Units into the skin daily at 10 pm.  . [DISCONTINUED] Insulin Detemir (LEVEMIR FLEXTOUCH) 100 UNIT/ML Pen Inject 50 Units into the skin daily at 10 pm.  . Blood Glucose Monitoring Suppl (ACCU-CHEK AVIVA) device Use as  instructed  . fluconazole (DIFLUCAN) 150 MG tablet Take 1 tablet (150 mg total) by mouth daily. (Patient not taking: Reported on 10/14/2016)  . gabapentin (NEURONTIN) 100 MG capsule Take 1 capsule (100 mg total) by mouth 3 (three) times daily. (Patient taking differently: Take 800 mg by mouth 3 (three) times daily. )  . glucose blood (ACCU-CHEK AVIVA) test strip Use to test glucose 4 times a day  . glucose blood (ACCU-CHEK GUIDE) test strip Use as instructed (Patient not taking: Reported on 10/14/2016)  . insulin aspart (NOVOLOG FLEXPEN) 100 UNIT/ML FlexPen Inject 15-21 Units into the skin 3 (three) times daily with meals.  Marland Kitchen labetalol (NORMODYNE) 200 MG tablet Take 1 tablet (200 mg total) by mouth 2 (two) times daily.  . metFORMIN (GLUCOPHAGE) 1000 MG tablet Take 1 tablet (1,000 mg total) by mouth 2 (two) times daily with a meal.  . prenatal vitamin w/FE, FA (PRENATAL 1 + 1) 27-1 MG TABS tablet Take 1 tablet by mouth daily at 12 noon.  . [DISCONTINUED] insulin aspart (NOVOLOG FLEXPEN) 100 UNIT/ML FlexPen Inject 10-16 Units into the skin 3 (three) times daily with meals.   Facility-Administered Encounter Medications as of 10/18/2016  Medication  . oxyCODONE-acetaminophen (PERCOCET/ROXICET) 5-325 MG per tablet 1 tablet   ALLERGIES: Allergies  Allergen Reactions  . Prozac [Fluoxetine Hcl]     Stiffness in neck, "stroke like" symptoms  . Cephalosporins Other (See Comments)    Unknown childhood reaction  . Latuda [Lurasidone Hcl]     Tongue, confusion    VACCINATION STATUS: Immunization History  Administered Date(s) Administered  . DTaP 08/15/1996, 10/16/1996, 12/19/1996, 02/13/1998, 07/18/2001  . H1N1 11/09/2007  . HPV Quadrivalent 11/03/2010, 05/17/2013  . Hepatitis A, Ped/Adol-2 Dose 05/17/2013  . Hepatitis B February 24, 1996, 08/15/1996, 12/19/1996  . HiB (PRP-OMP) 08/15/1996, 10/16/1996, 02/13/1998  . IPV 08/15/1996, 10/16/1996, 02/13/1998, 07/18/2001  . Influenza Whole 11/03/2005,  11/15/2006, 11/09/2007, 09/13/2008, 09/12/2009, 11/03/2010  . Influenza,inj,Quad PF,6+ Mos 10/16/2013  . Influenza-Unspecified 10/23/2012  . MMR 07/15/1997, 07/18/2001  . Meningococcal Conjugate 09/13/2008  . Pneumococcal Conjugate-13 07/31/1999  . Td 08/22/2007  . Tdap 08/22/2007, 06/27/2014  . Varicella 07/15/1997, 09/13/2008    Diabetes  She presents for her follow-up diabetic visit. She has type 2 diabetes mellitus. Onset time: She was diagnosed at age 42 or 34 years. Her disease course has been worsening (She states that she was treated only with metformin until last year when she was pregnant. Due to loss of control with A1c of 8.5%, she is currently on basal/bolus insulin along with her metformin. She denies any history of DKA. She is 4 months postpartum ). There are no hypoglycemic associated symptoms. Pertinent negatives for hypoglycemia include no confusion, headaches, pallor or seizures. Associated symptoms include fatigue, foot paresthesias, polydipsia and polyuria. Pertinent negatives for diabetes include no chest pain and no polyphagia. There are no hypoglycemic complications. (Noncompliance/nonadherence. She was seen in January and was supposed to return in February. She is returning because her PMD would not refill her medications. Her A1c is higher at 11.9% from 8.5%.) Symptoms are worsening. Diabetic complications include peripheral neuropathy. Risk factors for coronary artery disease include diabetes mellitus, hypertension, obesity and sedentary lifestyle. Current diabetic treatment includes insulin injections (She was supposed to be on Levemir 50 units daily at bedtime and NovoLog 10+ units 3 times a day before meals, unfortunateis on Lantus 55 units daily at bedtime, NovoLog 10 units on average 3 times , she made a dosing error on NovoLog. ). Her weight is stable. She is following a generally unhealthy diet. When asked about meal planning, she reported none. She has not had a previous  visit with a dietitian. She rarely participates in exercise. Her breakfast blood glucose range is generally >200 mg/dl. Her lunch blood glucose range is generally >200 mg/dl. Her dinner blood glucose range is generally >200 mg/dl. Her bedtime blood glucose range is generally >200 mg/dl. Her overall blood glucose range is >200 mg/dl. An ACE inhibitor/angiotensin II receptor blocker is being taken. She does not see a podiatrist.Eye exam is not current.     Review of Systems  Constitutional: Positive for fatigue. Negative for chills, fever and unexpected weight change.  HENT: Negative for trouble swallowing and voice change.   Eyes: Negative for visual disturbance.  Respiratory: Negative for cough, shortness of breath and wheezing.   Cardiovascular: Negative for chest pain, palpitations and leg swelling.  Gastrointestinal: Negative for diarrhea, nausea and vomiting.  Endocrine: Positive for polydipsia and polyuria. Negative for cold intolerance, heat intolerance and polyphagia.  Musculoskeletal: Negative for arthralgias and myalgias.  Skin: Negative for color change, pallor, rash and wound.  Neurological: Negative for seizures and headaches.  Psychiatric/Behavioral: Negative for confusion  and suicidal ideas.    Objective:    BP 124/82   Pulse 92   Ht 5' 5" (1.651 m)   Wt 225 lb (102.1 kg)   LMP 08/04/2016   BMI 37.44 kg/m   Wt Readings from Last 3 Encounters:  10/18/16 225 lb (102.1 kg)  10/14/16 224 lb (101.6 kg)  10/12/16 225 lb (102.1 kg)    Physical Exam  Constitutional: She is oriented to person, place, and time. She appears well-developed.  HENT:  Head: Normocephalic and atraumatic.  Eyes: EOM are normal.  Neck: Normal range of motion. Neck supple. No tracheal deviation present. No thyromegaly present.  Cardiovascular: Normal rate and regular rhythm.   Pulmonary/Chest: Effort normal and breath sounds normal.  Abdominal: Soft. Bowel sounds are normal. There is no tenderness.  There is no guarding.  Musculoskeletal: Normal range of motion. She exhibits no edema.  Neurological: She is alert and oriented to person, place, and time. She has normal reflexes. No cranial nerve deficit. Coordination normal.  Skin: Skin is warm and dry. No rash noted. No erythema. No pallor.  Psychiatric: She has a normal mood and affect. Judgment normal.    CMP     Component Value Date/Time   NA 136 08/27/2016 1012   K 4.5 08/27/2016 1012   CL 97 08/27/2016 1012   CO2 22 08/27/2016 1012   GLUCOSE 325 (H) 08/27/2016 1012   GLUCOSE 91 09/03/2015 2343   BUN 9 08/27/2016 1012   CREATININE 0.52 (L) 08/27/2016 1012   CREATININE 0.61 03/28/2012 1109   CALCIUM 9.2 08/27/2016 1012   PROT 7.5 08/27/2016 1012   ALBUMIN 4.3 08/27/2016 1012   AST 65 (H) 08/27/2016 1012   ALT 63 (H) 08/27/2016 1012   ALKPHOS 81 08/27/2016 1012   BILITOT 0.2 08/27/2016 1012   GFRNONAA 138 08/27/2016 1012   GFRNONAA >89 03/28/2012 1109   GFRAA 159 08/27/2016 1012   GFRAA >89 03/28/2012 1109     Diabetic Labs (most recent): Lab Results  Component Value Date   HGBA1C 11.9 (H) 08/27/2016   HGBA1C 8.5 12/23/2015   HGBA1C 6.3 (H) 03/19/2015     Lipid Panel ( most recent) Lipid Panel     Component Value Date/Time   CHOL 152 04/10/2013 0904   TRIG 158 (H) 04/10/2013 0904   HDL 34 (L) 04/10/2013 0904   CHOLHDL 4.5 04/10/2013 0904   VLDL 32 04/10/2013 0904   LDLCALC 86 04/10/2013 0904     Assessment & Plan:   1. Type 2 diabetes mellitus with complication, with long-term current use of insulin ([place]) Pregnancy at 10th  Week of gestation - He is her last visit, she called clinic to confirm pregnancy. - She was advised to switch her basal insulin to Levemir 50 units daily at bedtime and was kept on NovoLog 10+3 times a day before meals. - Unfortunately she made a dosing error or her NovoLog and eat only the sliding scale part, continued to register above target blood glucose readings and failed to  call clinic for those reports.  - Patient has chronically uncontrolled type symptomatic type  ( likely 2) DM since  ?21 years of age,  with most recent A1c of  11.9% increasing from 8.5 %. She was specifically advised to delay pregnancy until her A1c decreases to less than 6%.  - Now that she has confirm pregnancy at 10 weeks, she is approached to intensify treatment to control diabetes to target.  -  Suggestion is made for  her to avoid simple carbohydrates  from her diet including Cakes, Sweet Desserts / Pastries, Ice Cream, Soda (diet and regular), Sweet Tea, Candies, Chips, Cookies, Store Bought Juices, Alcohol in Excess of  1-2 drinks a day, Artificial Sweeteners, and "Sugar-free" Products. This will help patient to have stable blood glucose profile and potentially avoid unintended weight gain.   - I encouraged the patient to switch to  unprocessed or minimally processed complex starch and increased protein intake (animal or plant source), fruits, and vegetables.  - Patient is advised to stick to a routine mealtimes to eat 3 meals  a day and avoid unnecessary snacks ( to snack only to correct hypoglycemia).   - I have approached patient with the following individualized plan to manage diabetes and patient agrees:   -   For now, I  will proceed to increase her basal insulin Levemir to 60 units at bedtime, increase her NovoLog to 15 units 3 times a day before meals for pre-meal blood glucose above 90 mg/dL (additional correction dose for pre-meal blood glucose above 150 g/dL),  associated with strict monitoring of glucose  7 times a day-before meals,  at bedtime and  2 hours after each meal,   urged her to return in one week  with her meter and logs to review. - She would likely require much higher dose of insulin treatment.  - I discussed target blood glucose range of 60-90 fasting and preprandial, less than 120 2 hours postprandial.  - I advised her to discontinue metformin. - She will be  tested with anti-pancreatic antibodies when she approaches target A1c of 7%. - Patient specific target  A1c;  LDL, HDL, Triglycerides, and  Waist Circumference were discussed in detail.  2) BP/HTN: Controlled.   her medication is changed to labetalol from lisinopril by her OB/GYN provider.   3) Lipids/HPL:   Controlled, LDL of 86. No recent fasting lipid panel.  She is not on any statins for now. 4)  Weight/Diet: CDE Consult is in progress , exercise, and detailed carbohydrates information provided.  5) Chronic Care/Health Maintenance:  -Patient is on ACEi medications and encouraged to continue to follow up with Ophthalmology, Podiatrist at least yearly or according to recommendations, and advised to   stay away from smoking. I have recommended yearly flu vaccine and pneumonia vaccinatio n at least every 5 years; moderate intensity exercise for up to 150 minutes weekly; and  sleep for at least 7 hours a day.   - I advised patient to maintain close follow up with Medicine, [facility] for primary care needs and at high risk pregnancy OB/GYN clinic.  Follow up plan: - Return in about 1 week (around 10/25/2016) for follow up with meter and logs- no labs.  Glade Lloyd, MD Phone: [phone]  Fax: [phone]  This note was partially dictated with voice recognition software. Similar sounding words can be transcribed inadequately or may not  be corrected upon review.  10/18/2016, 12:12 PM

## 2016-10-18 NOTE — Progress Notes (Signed)
Follow up appointment for results  Chief Complaint  Patient presents with  . Follow-up    discuss [place]; vaginal bleeding started today    Blood pressure 110/60, pulse (!) 102, height  (1.651 m), last menstrual period 08/04/2016, not currently breastfeeding.  US Ob Comp Less 14 Wks  Result Date: 10/18/2016 DATING AND VIABILITY SONOGRAM Jaymes Foxworthy is a 21 y.o. year old G60P0101 with LMP 08/04/2016 which would correlate to  10+[redacted] weeks gestation.  She has regular menstrual cycles.   She is here today for a confirmatory initial sonogram. GESTATION: SINGLETON   FETAL ACTIVITY:          Heart rate         NO fetal pole or YS visualized        CERVIX: Appears closed ADNEXA: The ovaries are normal. GESTATIONAL AGE AND  BIOMETRICS: Gestational criteria: Estimated Date of Delivery: 05/11/2017 by LMP now at 10+5 wks Previous Scans:0 GESTATIONAL SAC in LUS           8.6 mm         5+4 weeks CROWN RUMP LENGTH No fetal pole or ys                                                                        AVERAGE EGA(BY THIS SCAN):  5+4 weeks  TECHNICIAN COMMENTS: [place] 5+4 wks GS located w/in the lower uterine segment,no YS or fetal pole seen,normal ovaries bilat,GS 8.6 mm A copy of this report including all images has been saved and backed up to a second source for retrieval if needed. All measures and details of the anatomical scan, placentation, fluid volume and pelvic anatomy are contained in that report. Amber Flora Lipps 10/18/2016 4:26 PM Clinical Impression and recommendations: I have reviewed the sonogram results above, combined with the patient's current clinical course, below are my impressions and any appropriate recommendations for management based on the sonographic findings. Active pregnancy loss with gestational sac in the lower uterine segment almost in the cervix G2P0101 Estimated Date of Delivery: None noted. Normal general sonographic findings Recommend routine care unless otherwise clinically indicated  Josey Forcier H 10/18/2016 4:54 PM   US Ob Transvaginal  Result Date: 10/18/2016 DATING AND VIABILITY SONOGRAM Ethelene Bumgardner is a 21 y.o. year old G34P0101 with LMP 08/04/2016 which would correlate to  10+[redacted] weeks gestation.  She has regular menstrual cycles.   She is here today for a confirmatory initial sonogram. GESTATION: SINGLETON   FETAL ACTIVITY:          Heart rate         NO fetal pole or YS visualized        CERVIX: Appears closed ADNEXA: The ovaries are normal. GESTATIONAL AGE AND  BIOMETRICS: Gestational criteria: Estimated Date of Delivery: 05/11/2017 by LMP now at 10+5 wks Previous Scans:0 GESTATIONAL SAC in LUS           8.6 mm         5+4 weeks CROWN RUMP LENGTH No fetal pole or ys  AVERAGE EGA(BY THIS SCAN):  5+4 weeks  TECHNICIAN COMMENTS: [place] 5+4 wks GS located w/in the lower uterine segment,no YS or fetal pole seen,normal ovaries bilat,GS 8.6 mm A copy of this report including all images has been saved and backed up to a second source for retrieval if needed. All measures and details of the anatomical scan, placentation, fluid volume and pelvic anatomy are contained in that report. Amber Flora Lipps 10/18/2016 4:26 PM Clinical Impression and recommendations: I have reviewed the sonogram results above, combined with the patient's current clinical course, below are my impressions and any appropriate recommendations for management based on the sonographic findings. Active pregnancy loss with gestational sac in the lower uterine segment almost in the cervix G2P0101 Estimated Date of Delivery: None noted. Normal general sonographic findings Recommend routine care unless otherwise clinically indicated Omolara Carol H 10/18/2016 4:54 PM   Discussed sonogram results with patient in full   MEDS ordered this encounter: Meds ordered this encounter  Medications  . HYDROcodone-acetaminophen (NORCO/VICODIN) 5-325 MG tablet    Sig: Take 1  tablet by mouth every 6 (six) hours as needed.    Dispense:  15 tablet    Refill:  0    Orders for this encounter: No orders of the defined types were placed in this encounter.   Impression: Early spontaneous pregnancy loss  Plan: Will prescribe some lortab for cramps Follow up prn  Pt aware most early miscarriages are from chromosomal issues Recommend getting CBG improved  Follow Up: Return if symptoms worsen or fail to improve.       Face to face time:  10 minutes  Greater than 50% of the visit time was spent in counseling and coordination of care with the patient.  The summary and outline of the counseling and care coordination is summarized in the note above.   All questions were answered.  Past Medical History:  Diagnosis Date  . ADHD (attention deficit hyperactivity disorder)   . Anemia   . Asthma    as a child  . Bipolar 1 disorder ([facility])   . Diabetes mellitus    type 2  . Diabetic neuropathy ([facility])   . Fatty liver   . GHTN vs. CHTN 06/11/2015  . Hidradenitis 06/05/2012  . History of hiatal hernia   . Irregular bleeding 06/05/2012  . Mononucleosis january 2014  . Nausea and vomiting 01/21/2015  . Obesity (BMI 30-39.9)   . Other and unspecified ovarian cyst 07/24/2012   Right  Ovarian cyst 8 cm on [place] 7/15 in ER  . Pneumonia   . Pregnant 01/21/2015  . Schizo affective schizophrenia ([facility])   . Seizures ([facility])    febrile seizures as a child  . Sleep apnea    no cpap    Past Surgical History:  Procedure Laterality Date  . ADENOIDECTOMY    . ANTERIOR CRUCIATE LIGAMENT REPAIR    . 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 TONSILLECTOMY    . WISDOM TOOTH EXTRACTION      OB History    Gravida Para Term Preterm AB Living   SAB TAB Ectopic Multiple Live Births         0 1      Allergies  Allergen Reactions  . Prozac [Fluoxetine Hcl]     Stiffness in neck, "stroke like"  symptoms  .  Cephalosporins Other (See Comments)    Unknown childhood reaction  . Latuda [Lurasidone Hcl]     Tongue, confusion     Social History   Social History  . Marital status: Married    Spouse name: N/A  . Number of children: N/A  . Years of education: N/A   Social History Main Topics  . Smoking status: Never Smoker  . Smokeless tobacco: Never Used  . Alcohol use No  . Drug use: No  . Sexual activity: Yes    Birth control/ protection: None   Other Topics Concern  . None   Social History Narrative  . None    Family History  Problem Relation Age of Onset  . Diabetes Mother   . Hypertension Mother   . Bipolar disorder Father   . Bipolar disorder Brother   . Anxiety disorder Brother   . Asthma Sister        intrinsic  . Stroke Maternal Grandmother   . Cancer Other        colon, lung, brain-paternal side

## 2016-10-18 NOTE — Progress Notes (Signed)
[place] 5+4 wks GS located w/in the lower uterine segment,no YS or fetal pole seen,normal ovaries bilat,GS 8.6 mm

## 2016-10-26 ENCOUNTER — Ambulatory Visit: Payer: Medicaid Other | Admitting: "Endocrinology

## 2016-11-03 ENCOUNTER — Encounter: Payer: Self-pay | Admitting: "Endocrinology

## 2016-11-03 ENCOUNTER — Ambulatory Visit (INDEPENDENT_AMBULATORY_CARE_PROVIDER_SITE_OTHER): Payer: Medicaid Other | Admitting: "Endocrinology

## 2016-11-03 VITALS — BP 151/89 | HR 113 | Ht 65.0 in | Wt 225.0 lb

## 2016-11-03 DIAGNOSIS — Z91199 Patient's noncompliance with other medical treatment and regimen due to unspecified reason: Secondary | ICD-10-CM

## 2016-11-03 DIAGNOSIS — E1065 Type 1 diabetes mellitus with hyperglycemia: Secondary | ICD-10-CM | POA: Insufficient documentation

## 2016-11-03 DIAGNOSIS — I10 Essential (primary) hypertension: Secondary | ICD-10-CM

## 2016-11-03 DIAGNOSIS — Z6835 Body mass index (BMI) 35.0-35.9, adult: Secondary | ICD-10-CM

## 2016-11-03 DIAGNOSIS — Z9119 Patient's noncompliance with other medical treatment and regimen: Secondary | ICD-10-CM

## 2016-11-03 LAB — GLUCOSE, POCT (MANUAL RESULT ENTRY): POC GLUCOSE: 366 mg/dL — AB (ref 70–99)

## 2016-11-03 MED ORDER — INSULIN ASPART 100 UNIT/ML FLEXPEN: 10.0000 [IU] | PEN_INJECTOR | Freq: Three times a day (TID) | SUBCUTANEOUS | 2 refills | Status: DC

## 2016-11-03 MED ORDER — METFORMIN HCL 500 MG PO TABS: 500.0000 mg | ORAL_TABLET | Freq: Two times a day (BID) | ORAL | 2 refills | Status: DC

## 2016-11-03 NOTE — Progress Notes (Signed)
Subjective:    Patient ID: Heather [name], female    DOB: 1995-09-13. Patient is being seen in f/u for management of diabetes requested by  Medicine, [facility]  Past Medical History:  Diagnosis Date  . ADHD (attention deficit hyperactivity disorder)   . Anemia   . Asthma    as a child  . Bipolar 1 disorder ([place])   . Diabetes mellitus    type 2  . Diabetic neuropathy ([place])   . Fatty liver   . GHTN vs. CHTN 06/11/2015  . Hidradenitis 06/05/2012  . History of hiatal hernia   . Irregular bleeding 06/05/2012  . Mononucleosis january 2014  . Nausea and vomiting 01/21/2015  . Obesity (BMI 30-39.9)   . Other and unspecified ovarian cyst 07/24/2012   Right  Ovarian cyst 8 cm on [place] 7/15 in ER  . Pneumonia   . Pregnant 01/21/2015  . Schizo affective schizophrenia ([place])   . Seizures ([place])    febrile seizures as a child  . Sleep apnea    no cpap   Past Surgical History:  Procedure Laterality Date  . ADENOIDECTOMY    . ANTERIOR CRUCIATE LIGAMENT REPAIR    . 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 TONSILLECTOMY    . WISDOM TOOTH EXTRACTION     Social History   Social History  . Marital status: Married    Spouse name: N/A  . Number of children: N/A  . Years of education: N/A   Social History Main Topics  . Smoking status: Never Smoker  . Smokeless tobacco: Never Used  . Alcohol use No  . Drug use: No  . Sexual activity: Yes    Birth control/ protection: None   Other Topics Concern  . None   Social History Narrative  . None   Outpatient Encounter Prescriptions as of 11/03/2016  Medication Sig  . metFORMIN (GLUCOPHAGE) 500 MG tablet Take 1 tablet by mouth 2 (two) times daily with a meal.  . Blood Glucose Monitoring Suppl (ACCU-CHEK AVIVA) device Use as instructed  . gabapentin (NEURONTIN) 100 MG capsule Take 1 capsule (100 mg total) by mouth 3 (three) times daily. (Patient taking differently: Take  800 mg by mouth 3 (three) times daily. )  . glucose blood (ACCU-CHEK GUIDE) test strip Use as instructed  . HYDROcodone-acetaminophen (NORCO/VICODIN) 5-325 MG tablet Take 1 tablet by mouth every 6 (six) hours as needed.  . insulin aspart (NOVOLOG FLEXPEN) 100 UNIT/ML FlexPen Inject 10-16 Units into the skin 3 (three) times daily with meals.  . Insulin Detemir (LEVEMIR FLEXTOUCH) 100 UNIT/ML Pen Inject 60 Units into the skin daily at 10 pm.  . labetalol (NORMODYNE) 200 MG tablet Take 1 tablet (200 mg total) by mouth 2 (two) times daily.  . [DISCONTINUED] glucose blood (ACCU-CHEK AVIVA) test strip Use to test glucose 4 times a day  . [DISCONTINUED] insulin aspart (NOVOLOG FLEXPEN) 100 UNIT/ML FlexPen Inject 15-21 Units into the skin 3 (three) times daily with meals.  . [DISCONTINUED] metFORMIN (GLUCOPHAGE) 1000 MG tablet Take 1 tablet (1,000 mg total) by mouth 2 (two) times daily with a meal.  . [DISCONTINUED] prenatal vitamin w/FE, FA (PRENATAL 1 + 1) 27-1 MG TABS tablet Take 1 tablet by mouth daily at 12 noon.   Facility-Administered Encounter Medications as of 11/03/2016  Medication  . oxyCODONE-acetaminophen (PERCOCET/ROXICET) 5-325 MG per tablet 1 tablet  ALLERGIES: Allergies  Allergen Reactions  . Prozac [Fluoxetine Hcl]     Stiffness in neck, "stroke like" symptoms  . Cephalosporins Other (See Comments)    Unknown childhood reaction  . Latuda [Lurasidone Hcl]     Tongue, confusion    VACCINATION STATUS: Immunization History  Administered Date(s) Administered  . DTaP 08/15/1996, 10/16/1996, 12/19/1996, 02/13/1998, 07/18/2001  . H1N1 11/09/2007  . HPV Quadrivalent 11/03/2010, 05/17/2013  . Hepatitis A, Ped/Adol-2 Dose 05/17/2013  . Hepatitis B 1995-06-21, 08/15/1996, 12/19/1996  . HiB (PRP-OMP) 08/15/1996, 10/16/1996, 02/13/1998  . IPV 08/15/1996, 10/16/1996, 02/13/1998, 07/18/2001  . Influenza Whole 11/03/2005, 11/15/2006, 11/09/2007, 09/13/2008, 09/12/2009, 11/03/2010  .  Influenza,inj,Quad PF,6+ Mos 10/16/2013  . Influenza-Unspecified 10/23/2012  . MMR 07/15/1997, 07/18/2001  . Meningococcal Conjugate 09/13/2008  . Pneumococcal Conjugate-13 07/31/1999  . Td 08/22/2007  . Tdap 08/22/2007, 06/27/2014  . Varicella 07/15/1997, 09/13/2008    Diabetes  She presents for her follow-up diabetic visit. She has type 2 diabetes mellitus. Onset time: She was diagnosed at age 33 or 21 years. Her disease course has been worsening. There are no hypoglycemic associated symptoms. Pertinent negatives for hypoglycemia include no confusion, headaches, pallor or seizures. Associated symptoms include fatigue, foot paresthesias, polydipsia and polyuria. Pertinent negatives for diabetes include no chest pain and no polyphagia. There are no hypoglycemic complications. Symptoms are worsening. Diabetic complications include peripheral neuropathy. Risk factors for coronary artery disease include diabetes mellitus, hypertension, obesity and sedentary lifestyle. Current diabetic treatment includes insulin injections (She was supposed to be on Levemir 50 units daily at bedtime and NovoLog 10+ units 3 times a day before meals, unfortunateis on Lantus 55 units daily at bedtime, NovoLog 10 units on average 3 times , she made a dosing error on NovoLog. ). She is compliant with treatment none of the time. Her weight is stable. She is following a generally unhealthy diet. When asked about meal planning, she reported none. She has not had a previous visit with a dietitian. She rarely participates in exercise. Her overall blood glucose range is >200 mg/dl. (She came with inadequate monitoring. Her 70 average was 389 of 11 readings. She was supposed to monitor 4 times a day. Since her last visit she had a miscarriage of her first trimester pregnancy.) An ACE inhibitor/angiotensin II receptor blocker is being taken. She does not see a podiatrist.Eye exam is not current.     Review of Systems  Constitutional:  Positive for fatigue. Negative for chills, fever and unexpected weight change.  HENT: Negative for trouble swallowing and voice change.   Eyes: Negative for visual disturbance.  Respiratory: Negative for cough, shortness of breath and wheezing.   Cardiovascular: Negative for chest pain, palpitations and leg swelling.  Gastrointestinal: Negative for diarrhea, nausea and vomiting.  Endocrine: Positive for polydipsia and polyuria. Negative for cold intolerance, heat intolerance and polyphagia.  Musculoskeletal: Negative for arthralgias and myalgias.  Skin: Negative for color change, pallor, rash and wound.  Neurological: Negative for seizures and headaches.  Psychiatric/Behavioral: Negative for confusion and suicidal ideas.    Objective:    BP (!) 151/89   Pulse (!) 113   Ht _0  (1.651 m)   Wt 225 lb (102.1 kg)   LMP 08/04/2016   BMI 37.44 kg/m   Wt Readings from Last 3 Encounters:  11/03/16 225 lb (102.1 kg)  10/18/16 225 lb (102.1 kg)  10/14/16 224 lb (101.6 kg)    Physical Exam  Constitutional: She is oriented to person, place, and time. She appears  well-developed.  HENT:  Head: Normocephalic and atraumatic.  Eyes: EOM are normal.  Neck: Normal range of motion. Neck supple. No tracheal deviation present. No thyromegaly present.  Cardiovascular: Normal rate and regular rhythm.   Pulmonary/Chest: Effort normal and breath sounds normal.  Abdominal: Soft. Bowel sounds are normal. There is no tenderness. There is no guarding.  Musculoskeletal: Normal range of motion. She exhibits no edema.  Neurological: She is alert and oriented to person, place, and time. She has normal reflexes. No cranial nerve deficit. Coordination normal.  Skin: Skin is warm and dry. No rash noted. No erythema. No pallor.  Psychiatric: She has a normal mood and affect. Judgment normal.    CMP     Component Value Date/Time   NA 136 08/27/2016 1012   K 4.5 08/27/2016 1012   CL 97 08/27/2016 1012   CO2  22 08/27/2016 1012   GLUCOSE 325 (H) 08/27/2016 1012   GLUCOSE 91 09/03/2015 2343   BUN 9 08/27/2016 1012   CREATININE 0.52 (L) 08/27/2016 1012   CREATININE 0.61 03/28/2012 1109   CALCIUM 9.2 08/27/2016 1012   PROT 7.5 08/27/2016 1012   ALBUMIN 4.3 08/27/2016 1012   AST 65 (H) 08/27/2016 1012   ALT 63 (H) 08/27/2016 1012   ALKPHOS 81 08/27/2016 1012   BILITOT 0.2 08/27/2016 1012   GFRNONAA 138 08/27/2016 1012   GFRNONAA >89 03/28/2012 1109   GFRAA 159 08/27/2016 1012   GFRAA >89 03/28/2012 1109     Diabetic Labs (most recent): Lab Results  Component Value Date   HGBA1C 11.9 (H) 08/27/2016   HGBA1C 8.5 12/23/2015   HGBA1C 6.3 (H) 03/19/2015     Lipid Panel ( most recent) Lipid Panel     Component Value Date/Time   CHOL 152 04/10/2013 0904   TRIG 158 (H) 04/10/2013 0904   HDL 34 (L) 04/10/2013 0904   CHOLHDL 4.5 04/10/2013 0904   VLDL 32 04/10/2013 0904   LDLCALC 86 04/10/2013 0904     Assessment & Plan:   1. Type 2 diabetes mellitus with complication, with long-term current use of insulin (HCC) Pregnancy at 10th  Week of gestation - He is her last visit, she called clinic to confirm miscarriage of pregnancy. - She was advised to continue basal insulin 60 units daily at bedtime and was kept on NovoLog 15 times a day before meals. - Unfortunately she did not continue to monitor properly, came with a meter already showing 11 readings in the last 7 days.   - Patient has chronically uncontrolled type symptomatic type  ( likely 2) DM since  ?21 years of age,  with most recent A1c of  11.9%.   -  Suggestion is made for her to avoid simple carbohydrates  from her diet including Cakes, Sweet Desserts / Pastries, Ice Cream, Soda (diet and regular), Sweet Tea, Candies, Chips, Cookies, Store Bought Juices, Alcohol in Excess of  1-2 drinks a day, Artificial Sweeteners, and "Sugar-free" Products. This will help patient to have stable blood glucose profile and potentially avoid  unintended weight gain.  - I encouraged the patient to switch to  unprocessed or minimally processed complex starch and increased protein intake (animal or plant source), fruits, and vegetables.  - Patient is advised to stick to a routine mealtimes to eat 3 meals  a day and avoid unnecessary snacks ( to snack only to correct hypoglycemia).   - I have approached patient with the following individualized plan to manage diabetes and patient agrees:   -  I have re-approached her for intensive basal/bolus insulin therapy to achieve control of diabetes to target. -  I  will proceed with her basal insulin Levemir  60 units at bedtime, decrease her NovoLog to 10 units 3 times a day before meals for pre-meal blood glucose above 90 mg/dL (additional correction dose for pre-meal blood glucose above 150 g/dL),  associated with strict monitoring of glucose  4 times a day-before meals and  at bedtime .  - I advised her to resume metformin 500 minute grams by mouth twice a day. - She will be tested with anti-pancreatic antibodies when she approaches target A1c of 7%. - Patient specific target  A1c;  LDL, HDL, Triglycerides, and  Waist Circumference were discussed in detail.  2) BP/HTN: Controlled.   her medication is changed to labetalol from lisinopril by her OB/GYN provider.   3) Lipids/HPL:   Controlled, LDL of 86. No recent fasting lipid panel.  She is not on any statins for now. 4)  Weight/Diet: CDE Consult is in progress , exercise, and detailed carbohydrates information provided.  5) Chronic Care/Health Maintenance:  -Patient is on ACEi medications and encouraged to continue to follow up with Ophthalmology, Podiatrist at least yearly or according to recommendations, and advised to   stay away from smoking. I have recommended yearly flu vaccine and pneumonia vaccinatio n at least every 5 years; moderate intensity exercise for up to 150 minutes weekly; and  sleep for at least 7 hours a day.  - Time  spent with the patient: 25 min, of which >50% was spent in reviewing her sugar logs , discussing her hypo- and hyper-glycemic episodes, reviewing her current and  previous labs and insulin doses and developing a plan to avoid hypo- and hyper-glycemia.    - I advised patient to maintain close follow up with Medicine, [facility] for primary care needs and at high risk pregnancy OB/GYN clinic.  Follow up plan: - Return in about 5 weeks (around 12/08/2016) for follow up with pre-visit labs, meter, and logs.  Glade Lloyd, MD Phone: [phone]  Fax: [phone]  This note was partially dictated with voice recognition software. Similar sounding words can be transcribed inadequately or may not  be corrected upon review.  11/03/2016, 1:44 PM

## 2016-11-03 NOTE — Patient Instructions (Signed)

## 2016-11-04 ENCOUNTER — Ambulatory Visit: Payer: Medicaid Other | Admitting: "Endocrinology

## 2016-12-08 ENCOUNTER — Encounter: Payer: Self-pay | Admitting: "Endocrinology

## 2016-12-08 ENCOUNTER — Ambulatory Visit: Payer: Medicaid Other | Admitting: "Endocrinology

## 2016-12-10 ENCOUNTER — Ambulatory Visit: Payer: Medicaid Other | Admitting: "Endocrinology

## 2016-12-13 ENCOUNTER — Ambulatory Visit: Payer: Medicaid Other | Admitting: "Endocrinology

## 2017-03-28 ENCOUNTER — Other Ambulatory Visit: Payer: Self-pay | Admitting: "Endocrinology

## 2017-05-05 ENCOUNTER — Other Ambulatory Visit: Payer: Self-pay

## 2017-05-05 ENCOUNTER — Encounter: Payer: Self-pay | Admitting: Endocrinology

## 2017-05-05 ENCOUNTER — Ambulatory Visit (INDEPENDENT_AMBULATORY_CARE_PROVIDER_SITE_OTHER): Payer: Medicaid Other | Admitting: Endocrinology

## 2017-05-05 VITALS — BP 140/70 | HR 97 | Ht 65.0 in | Wt 224.0 lb

## 2017-05-05 DIAGNOSIS — E1065 Type 1 diabetes mellitus with hyperglycemia: Secondary | ICD-10-CM

## 2017-05-05 LAB — POCT GLYCOSYLATED HEMOGLOBIN (HGB A1C): Hemoglobin A1C: 13.2

## 2017-05-05 MED ORDER — ACCU-CHEK FASTCLIX LANCET KIT
1.0000 | PACK | Freq: Every day | 0 refills | Status: DC
Start: 2017-05-05 — End: 2017-07-18

## 2017-05-05 MED ORDER — ACCU-CHEK AVIVA PLUS W/DEVICE KIT: 1.0000 | PACK | Freq: Every day | 0 refills | Status: DC

## 2017-05-05 MED ORDER — METFORMIN HCL ER 500 MG PO TB24: 2000.0000 mg | ORAL_TABLET | Freq: Every day | ORAL | 3 refills | Status: DC

## 2017-05-05 MED ORDER — INSULIN DEGLUDEC 200 UNIT/ML ~~LOC~~ SOPN: 70.0000 [IU] | PEN_INJECTOR | Freq: Every day | SUBCUTANEOUS | 3 refills | Status: DC

## 2017-05-05 MED ORDER — ACCU-CHEK MULTICLIX LANCETS MISC: 12 refills | Status: DC

## 2017-05-05 MED ORDER — GLUCOSE BLOOD VI STRP: ORAL_STRIP | 12 refills | Status: DC

## 2017-05-05 MED ORDER — FLUCONAZOLE 100 MG PO TABS: 100.0000 mg | ORAL_TABLET | Freq: Every day | ORAL | 1 refills | Status: DC

## 2017-05-05 MED ORDER — GABAPENTIN 600 MG PO TABS: 600.0000 mg | ORAL_TABLET | Freq: Three times a day (TID) | ORAL | 1 refills | Status: DC

## 2017-05-05 NOTE — Progress Notes (Addendum)
Patient ID: Heather [name], female   DOB: [birth year], 22 y.o.   MRN: [id number]          Reason for Appointment : Consultation for  Diabetes  History of Present Illness   Referring provider: Dr. Jerene Bears         Diagnosis: Type 1 diabetes mellitus, date of diagnosis:   2004        Previous history:   At the time of diagnosis she was apparently not obese and started on metformin for treatment She also took insulin around the age of 15 and her blood sugars were significantly high but only for a couple of months No records are available about her previous level of control She did take glyburide during her first pregnancy and this apparently was effective including tendency to hypoglycemia She was again started on insulin when she got pregnant in December 2017 and was continued on insulin subsequently   Recent history:     INSULIN regimen is: Levemir 50 units hs, NovoLog 16 units PC   Current management, blood sugar patterns and problems identified:  She has been initially seen by an endocrinologist in 01/2016 when her A1c was 8.5  Subsequently in 10/2016 was seen in follow-up by the endocrinologist since her A1c had gone up to 11.9 in August.  Prior to her last pregnancy she was taking Lantus and NovoLog in relatively lower doses  Since her pregnancy she has been on Lantus and NovoLog  However until about a month ago she had been very irregular with her insulin regimen, glucose monitoring and diet  She has had increased thirst and urination, dry mouth, abdominal skin infections, vaginal candidiasis for several months now  She also feels fatigued and sleepy and not able to do much activity  Because of increased thirst she is consuming large amounts of regular soft drinks, sweet tea and juices and was told not to drink any diet drinks  She has not had any weight change since October 2018  She takes the same amount of insulin at mealtimes regardless of what she is eating or what  her blood sugar  Although she has seen a dietitian she is not comfortable counting carbohydrates  Also apparently her meter is not working well and she is checking blood sugars infrequently and mostly in the morning   Glucose monitoring:  is being done 3 times a day         Glucometer:  Accucheck    Blood Glucose readings from recall:  Mean values apply above for all meters except median for One Touch  PRE-MEAL Fasting Lunch Dinner Bedtime Overall  Glucose range: 300-450   500    Mean/median:         Self-care: The diet that the patient has been following is: None         Exercise: none          Dietician consultation: Most recent: 10/2016  .         CDE consultation:  Wt Readings from Last 3 Encounters:  05/05/17 224 lb (101.6 kg)  11/03/16 225 lb (102.1 kg)  10/18/16 225 lb (102.1 kg)   Diabetes labs:  Lab Results  Component Value Date   HGBA1C 13.2 05/05/2017   HGBA1C 11.9 (H) 08/27/2016   HGBA1C 8.5 12/23/2015   Lab Results  Component Value Date   MICROALBUR 2.26 (H) 04/10/2013   LDLCALC 86 04/10/2013   CREATININE 0.52 (L) 08/27/2016    No results found for:  MICRALBCREAT   Allergies as of 05/05/2017      Reactions   Prozac [fluoxetine Hcl]    Stiffness in neck, "stroke like" symptoms   Cephalosporins Other (See Comments)   Unknown childhood reaction   Latuda [lurasidone Hcl]    Tongue, confusion       Medication List        Accurate as of 05/05/17  9:05 PM. Always use your most recent med list.          ACCU-CHEK AVIVA device Use as instructed   ACCU-CHEK AVIVA PLUS w/Device Kit 1 each by Does not apply route daily. Use as directed to check blood sugar four times daily.   ACCU-CHEK FASTCLIX LANCET Kit 1 each by Does not apply route daily. Use as directed to check blood sugar four times daily.   accu-chek multiclix lancets Use as instructed to check blood sugar 4 times daily.   clonazePAM 1 MG tablet Commonly known as:  KLONOPIN Take 1  mg by mouth 3 (three) times daily.   fluconazole 100 MG tablet Commonly known as:  DIFLUCAN Take 1 tablet (100 mg total) by mouth daily.   gabapentin 600 MG tablet Commonly known as:  NEURONTIN Take 1 tablet (600 mg total) by mouth 3 (three) times daily.   glucose blood test strip Commonly known as:  ACCU-CHEK GUIDE Use as instructed   glucose blood test strip Commonly known as:  ACCU-CHEK AVIVA PLUS Use as instructed   insulin aspart 100 UNIT/ML FlexPen Commonly known as:  NOVOLOG FLEXPEN Inject 10-16 Units into the skin 3 (three) times daily with meals.   Insulin Degludec 200 UNIT/ML Sopn Commonly known as:  TRESIBA FLEXTOUCH Inject 70 Units into the skin daily.   Insulin Detemir 100 UNIT/ML Pen Commonly known as:  LEVEMIR FLEXTOUCH Inject 60 Units into the skin daily at 10 pm.   metFORMIN 500 MG 24 hr tablet Commonly known as:  GLUCOPHAGE-XR Take 4 tablets (2,000 mg total) by mouth daily with supper.       Allergies:  Allergies  Allergen Reactions  . Prozac [Fluoxetine Hcl]     Stiffness in neck, "stroke like" symptoms  . Cephalosporins Other (See Comments)    Unknown childhood reaction  . Latuda [Lurasidone Hcl]     Tongue, confusion     Past Medical History:  Diagnosis Date  . ADHD (attention deficit hyperactivity disorder)   . Anemia   . Asthma    as a child  . Bipolar 1 disorder ([place])   . Diabetes mellitus    type 2  . Diabetic neuropathy ([place])   . Fatty liver   . GHTN vs. CHTN 06/11/2015  . Hidradenitis 06/05/2012  . History of hiatal hernia   . Irregular bleeding 06/05/2012  . Mononucleosis january 2014  . Nausea and vomiting 01/21/2015  . Obesity (BMI 30-39.9)   . Other and unspecified ovarian cyst 07/24/2012   Right  Ovarian cyst 8 cm on [place] 7/15 in ER  . Pneumonia   . Pregnant 01/21/2015  . Schizo affective schizophrenia ([place])   . Seizures ([place])    febrile seizures as a child  . Sleep apnea    no cpap    Past Surgical History:  Procedure  Laterality Date  . ADENOIDECTOMY    . ANTERIOR CRUCIATE LIGAMENT REPAIR    . 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 TONSILLECTOMY    .  WISDOM TOOTH EXTRACTION      Family History  Problem Relation Age of Onset  . Diabetes Mother   . Hypertension Mother   . Bipolar disorder Father   . Bipolar disorder Brother   . Anxiety disorder Brother   . Asthma Sister        intrinsic  . Stroke Maternal Grandmother   . Cancer Other        colon, lung, brain-paternal side    Social History:  reports that she has never smoked. She has never used smokeless tobacco. She reports that she does not drink alcohol or use drugs.      Review of Systems  Constitutional: Negative for weight loss and reduced appetite.  HENT: Negative for headaches.   Eyes: Negative for visual disturbance.  Respiratory: Negative for shortness of breath.   Cardiovascular: Negative for chest pain and leg swelling.  Gastrointestinal: Negative for constipation.  Endocrine: Positive for fatigue.       She has had infrequent menstrual cycles since menarche with menstrual cycles every 2 to 5 months and usually short lasting.  Not improved when she takes metformin.  Did not have difficulty conceiving however  Genitourinary:       Has had recent and frequent significant yeast infections with minimal improvement using nystatin cream  Musculoskeletal: Negative for joint pain.  Skin:       Has recurrent skin infections on the abdominal wall, currently on antibiotics including Bactrim No excessive facial hair  Neurological: Positive for tingling.       She has had pain, burning, tingling and numbness in her lower feet and legs.  Also has hypersensitivity especially when putting legs in water.  Has only some relief with taking gabapentin 300 mg 3 times daily currently  Psychiatric/Behavioral: Negative for insomnia.        Lipids: No recent labs  available  Lab Results  Component Value Date   CHOL 152 04/10/2013   HDL 34 (L) 04/10/2013   LDLCALC 86 04/10/2013   TRIG 158 (H) 04/10/2013   CHOLHDL 4.5 04/10/2013     LABS:  Office Visit on 05/05/2017  Component Date Value Ref Range Status  . Hemoglobin A1C 05/05/2017 13.2   Final    Physical Examination:   BP 140/70 (BP Location: Left Arm, Patient Position: Sitting, Cuff Size: Large)   Pulse 97   Ht _0  (1.651 m)   Wt 224 lb (101.6 kg)   SpO2 98%   BMI 37.28 kg/m   GENERAL:  Generalized obesity present  HEENT:         Eye exam shows normal external appearance.  Fundus exam shows no retinopathy. Oral exam shows normal mucosa .  NECK:     She has acanthosis with mild pigmentation and significant creasing of the skin of the neck especially laterally      There is no lymphadenopathy.   Thyroid is not enlarged and no nodules felt.   LUNGS:         Chest is symmetrical. Lungs are clear to auscultation.Marland Kitchen   HEART:         Heart sounds:  S1 and S2 are normal. No murmurs or clicks heard., no S3 or S4.   ABDOMEN:  no distention present. Liver and spleen are not palpable. No other mass or tenderness present.  EXTREMITIES:     There is no edema. No skin lesions present.Marland Kitchen  NEUROLOGICAL:        Vibration sense is moderately reduced in  toes. Ankle jerks are absent bilaterally.       Diabetic Foot Exam - Simple   Simple Foot Form Diabetic Foot exam was performed with the following findings:  Yes 05/05/2017  5:11 PM  Visual Inspection See comments:  Yes Sensation Testing See comments:  Yes Pulse Check Posterior Tibialis and Dorsalis pulse intact bilaterally:  Yes Comments Callus formation on the right lateral foot Practically absent monofilament sensation distal right toes and decreased on the left toes         MUSCULOSKELETAL:       There is no enlargement or deformity of the joints.  SKIN:  She has an area of redness on the lower abdomen in the midline      ASSESSMENT:  Diabetes type 2, childhood onset, current BMI 37  See history of present illness for detailed discussion of current diabetes management, blood sugar patterns and problems identified  A1c is markedly increased at 13.2  She currently appears to be insulin deficient with marked hyperglycemia without symptoms of ketoacidosis This is despite taking metformin and basal bolus insulin regimen However she has been taking her insulin more regularly only for the last month or so Also has insulin resistance and likely metabolic syndrome with acanthosis   Problems identified:  Poor diet with excessive simple sugar and carbohydrate intake in the form of regular soft drinks and juices  Insulin resistance and continued marked hyperglycemia despite taking basal bolus insulin  Very low motivation level for taking care of her diabetes although recently she appears more interested because of symptomatic hyperglycemia and worsening symptoms of neuropathy  Persistently high glucose throughout the day and relatively high readings postprandially  Complications: Peripheral neuropathy with some sensory loss  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, not controlled with metformin alone  PLAN:    She will start using a new glucose monitor for more reliability and consistent monitoring, she can continue using Accu-Chek Aviva plus  Check blood sugar 4 times a day  Discussed blood sugar targets at various times  Increase insulin doses as follows:  Levemir will be 35 units twice a day and when finished switch to [place] 70 units daily  She will adjust the [place] every 3 to 4 days based on fasting blood sugars and go up 5 units until morning sugars are below 150 at least  Increase NovoLog up to 25 units with meals and use a correction factor of 5 units per 50 mg over 200  She was completely stop drinking regular soda drinks and juices and cannot have any diet drinks  Eats  balanced meals with modified carbohydrate and fat intake  Would be beneficial to have her exercise starts feeling better also  GABAPENTIN will be increased to 600 mg 3 times daily for more symptomatic relief but consider switching to Lyrica if not controlled  Diflucan 100 mg daily for 3 days for vaginal candidiasis  Consultation with dietitian and nurse educator for meal planning  Consider ACTOS for insulin resistance especially since her oligomenorrhea is still not controlled from PCOS would like to do fasting free testosterone level also  Follow-up in 3 weeks  Patient Instructions  Levemir 35 units 2x daily  Novolog 25 units with each meal  For sugar > 200, for every 50 points add 5 more units at each meal  Tresiba 70 units daily  Check blood sugars on waking up daily and before meals    Also check blood sugars about 2 hours after a meal and  do this after different meals by rotation  Recommended blood sugar levels on waking up is 90-130 and about 2 hours after meal is 130-160  Please bring your blood sugar monitor to each visit, thank you  No sugar drinks   Counseling time on subjects discussed in assessment and plan sections is over 50% of today's study minute visit    Elayne Snare 05/05/2017, 9:05 PM   Note: This note was prepared with Dragon voice recognition system technology. Any transcriptional errors that result from this process are unintentional.

## 2017-05-05 NOTE — Patient Instructions (Addendum)
Levemir 35 units 2x daily  Novolog 25 units with each meal  For sugar > 200, for every 50 points add 5 more units at each meal  Tresiba 70 units daily  Check blood sugars on waking up daily and before meals    Also check blood sugars about 2 hours after a meal and do this after different meals by rotation  Recommended blood sugar levels on waking up is 90-130 and about 2 hours after meal is 130-160  Please bring your blood sugar monitor to each visit, thank you  No sugar drinks

## 2017-06-03 ENCOUNTER — Ambulatory Visit: Payer: Self-pay | Admitting: Endocrinology

## 2017-06-24 ENCOUNTER — Other Ambulatory Visit: Payer: Self-pay | Admitting: Endocrinology

## 2017-07-15 ENCOUNTER — Telehealth: Payer: Self-pay

## 2017-07-15 NOTE — Telephone Encounter (Signed)
After speaking to hospital and getting actual readings of blood sugar when at the hospital, I spoke with Dr. Elvera LennoxGherghe and she decided to change the dosages from [place] 90 units daily to Tresiba 80 units daily and from Novolog 30 units before meals to Novolog 28 units before meals. I called pt and stated that I spoke with the hospital and they informed me that they never recorded a blood sugar over 600. At this time, pt stated that is was never over 600 when she was at the hospital. It was over 600 when she was at home, after she left the hospital. Pt was then instructed to keep the initial instructions of Tresiba 90 units QD and Novolog 30 units TID before meals. Pt was also instructed to call back with any questions and concerns and go to the hospital if her blood sugar does not decrease after taking the 15 units of the one time dose of Novolog. She was also told to keep her appt on 07/18/17 at 0800 and to be here by 0745. Pt verbalized understanding.

## 2017-07-15 NOTE — Telephone Encounter (Signed)
Pt called and stated that this morning, she had [facility] [place] be [place] for a spot on her leg. Pt stated that the doctor stated that she had cellulitis and he noticed she was a diabetic pt and he checked her blood sugar at which time is was noted to be over 600. Pt stated that she was given 5 units of Novolog and her blood sugar came down to 459, at which time she was discharged with instructions to come back the following day to be seen for the Cellulitis on her leg. Pt then informed me that she is not checking her sugar much because the pharmacy told her that her insurance will not pay for the blood sugar meter. I then advised pt that she should call her insurance and find out what meter they will pay for and then let this office know. Pt stated that she is experiencing light headness and is stumbling and falling and feeling like she is blacking out. After informing the pt that Dr. Lucianne MussKumar is off for the day, I spoke with Dr. Elvera LennoxGherghe and asked if she could advise in his absence.  Dr.Gherghe gave verbal orders to increase Tresiba from 70 units daily to 90 units daily as well as increase Novolog from 25 units before meals to 30 units before meals. She also gave a one time order to give Novolog 15 units NOW, and pt was instructed to go back to the Emergency Department if her blood sugar did not come down any after taking the 15 units. Pt was also told to [facility] to [facility] and buy the AccuCheck Guide Me blood sugar meter, which is available over the counter for $10. Pt verbalized understanding of this.  After getting off the phone with pt, I [facility], where patient had been seen this am. After speaking to charge nurse, I was notified that patient's blood sugar was in fact not over 600, but was reported by ED to be 426 and 10 units of Novolog was given, not 5 units. Blood sugar was reported by ED to have reduced from 426 to 320, at which time pt was  given ABT for her Cellulitis and discharged with instructions to return tomorrow to assess for further issues and possible drainage of wound on leg. I then asked Charge nurse to fax the office notes to this office, as pt is scheduled to be seen on 07/18/2017 at 0800. Charge nurse then transferred me to medical records where I was instructed to fax a cover sheet from this office with patient's name and DOB and specific instructions as to what records this office would like faxed back. There person in Medical Records stated that the medical records would be sent as soon as the request is received.

## 2017-07-15 NOTE — Telephone Encounter (Signed)
Noted  

## 2017-07-15 NOTE — Telephone Encounter (Signed)
Error

## 2017-07-17 NOTE — Progress Notes (Signed)
Patient ID: Heather [name], female   DOB: Jun 06, 1995, 22 y.o.   MRN: [id number]          Reason for Appointment :  for  Diabetes  History of Present Illness   Referring provider: Dr. Jerene Bears         Diagnosis: Type 1 diabetes mellitus, date of diagnosis:   2004        Previous history:   At the time of diagnosis she was apparently not obese and started on metformin for treatment She also took insulin around the age of 87 and her blood sugars were significantly high but only for a couple of months No records are available about her previous level of control She did take glyburide during her first pregnancy and this apparently was effective including tendency to hypoglycemia She was again started on insulin when she got pregnant in December 2017 and was continued on insulin subsequently   Recent history:     INSULIN regimen is: [place] 80 units hs, NovoLog 25 units ac 3 times a day   Current management, blood sugar patterns and problems identified:  She was switched from Levemir to [place] on on her initial consultation in 05/2017 but she did not follow-up subsequently  She thinks that her blood sugars have been around 500 mostly in she is using her mother's monitor  She says that she is cut out most of the regular drinks and juice that she was having is trying to eat healthy  However even with taking NovoLog 25 units consistently 3 times a day and to see by in the evening she thinks her sugars have being higher  On Friday she had a visit to the ER blood sugar was over 400, no labs available  She thinks that her insurance does not pay for Accu-Chek meter and she did not fill the prescription that was sent  Although she has a [employer] brand meter now she does not have lancet device and has not checked her sugar at home much at all  She thinks that [place] makes her hungry after she takes it, her weight is about the same  She continues to feel fatigued as well as tired and has  some excessive sleepiness also  Her weight is about the same   Glucose monitoring: 0-3 times a day         Glucometer:  Accucheck   She does not have a record of her blood sugars   Self-care: The diet that the patient has been following is: Avoiding a lot of fast food and regular soft drinks         Exercise: none          Dietician consultation: Most recent: 10/2016  .         CDE consultation:  Wt Readings from Last 3 Encounters:  07/18/17 224 lb (101.6 kg)  05/05/17 224 lb (101.6 kg)  11/03/16 225 lb (102.1 kg)   Diabetes labs:  Lab Results  Component Value Date   HGBA1C 13.2 05/05/2017   HGBA1C 11.9 (H) 08/27/2016   HGBA1C 8.5 12/23/2015   Lab Results  Component Value Date   MICROALBUR 2.26 (H) 04/10/2013   LDLCALC 86 04/10/2013   CREATININE 0.52 (L) 08/27/2016    No results found for: MICRALBCREAT   Allergies as of 07/18/2017      Reactions   Prozac [fluoxetine Hcl]    Stiffness in neck, "stroke like" symptoms   Cephalosporins Other (See Comments)   Unknown  childhood reaction   Latuda [lurasidone Hcl]    Tongue, confusion       Medication List        Accurate as of 07/18/17  8:05 AM. Always use your most recent med list.          ACCU-CHEK AVIVA device Use as instructed   ACCU-CHEK FASTCLIX LANCET Kit 1 each by Does not apply route daily. Use as directed to check blood sugar four times daily.   clonazePAM 1 MG tablet Commonly known as:  KLONOPIN Take 1 mg by mouth 3 (three) times daily.   gabapentin 600 MG tablet Commonly known as:  NEURONTIN TAKE 1 TABLET BY MOUTH THREE TIMES DAILY   glucose blood test strip Commonly known as:  ACCU-CHEK GUIDE Use as instructed   glucose blood test strip Commonly known as:  ACCU-CHEK AVIVA PLUS Use as instructed   insulin aspart 100 UNIT/ML FlexPen Commonly known as:  NOVOLOG FLEXPEN Inject 10-16 Units into the skin 3 (three) times daily with meals.   Insulin Degludec 200 UNIT/ML Sopn Commonly  known as:  TRESIBA FLEXTOUCH Inject 70 Units into the skin daily.   metFORMIN 500 MG 24 hr tablet Commonly known as:  GLUCOPHAGE-XR Take 4 tablets (2,000 mg total) by mouth daily with supper.       Allergies:  Allergies  Allergen Reactions  . Prozac [Fluoxetine Hcl]     Stiffness in neck, "stroke like" symptoms  . Cephalosporins Other (See Comments)    Unknown childhood reaction  . Latuda [Lurasidone Hcl]     Tongue, confusion     Past Medical History:  Diagnosis Date  . ADHD (attention deficit hyperactivity disorder)   . Anemia   . Asthma    as a child  . Bipolar 1 disorder ([place])   . Diabetes mellitus    type 2  . Diabetic neuropathy ([place])   . Fatty liver   . GHTN vs. CHTN 06/11/2015  . Hidradenitis 06/05/2012  . History of hiatal hernia   . Irregular bleeding 06/05/2012  . Mononucleosis january 2014  . Nausea and vomiting 01/21/2015  . Obesity (BMI 30-39.9)   . Other and unspecified ovarian cyst 07/24/2012   Right  Ovarian cyst 8 cm on [place] 7/15 in ER  . Pneumonia   . Pregnant 01/21/2015  . Schizo affective schizophrenia ([place])   . Seizures ([place])    febrile seizures as a child  . Sleep apnea    no cpap    Past Surgical History:  Procedure Laterality Date  . ADENOIDECTOMY    . ANTERIOR CRUCIATE LIGAMENT REPAIR    . 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 TONSILLECTOMY    . WISDOM TOOTH EXTRACTION      Family History  Problem Relation Age of Onset  . Diabetes Mother   . Hypertension Mother   . Bipolar disorder Father   . Bipolar disorder Brother   . Anxiety disorder Brother   . Asthma Sister        intrinsic  . Stroke Maternal Grandmother   . Cancer Other        colon, lung, brain-paternal side    Social History:  reports that she has never smoked. She has never used smokeless tobacco. She reports that she does not drink alcohol or use drugs.      Review of Systems      Lipids:  No recent labs available  Lab Results  Component Value Date   CHOL 152 04/10/2013   HDL 34 (L) 04/10/2013   LDLCALC 86 04/10/2013   TRIG 158 (H) 04/10/2013   CHOLHDL 4.5 04/10/2013   Complains of feeling swimmy headed and nearly passing out in the morning  Although she has had irregular menstrual cycles she has not missed the last 2  Her neuropathic symptoms are better with increasing gabapentin to 600 mg  LABS:  No visits with results within 1 Week(s) from this visit.  Latest known visit with results is:  Office Visit on 05/05/2017  Component Date Value Ref Range Status  . Hemoglobin A1C 05/05/2017 13.2   Final    Physical Examination:   BP (!) 160/90 (BP Location: Left Arm, Patient Position: Sitting, Cuff Size: Normal)   Pulse 82   Ht 5' 5" (1.651 m)   Wt 224 lb (101.6 kg)   SpO2 97%   BMI 37.28 kg/m       ASSESSMENT:  Diabetes type 2, childhood onset, current BMI 37  See history of present illness for detailed discussion of current diabetes management, blood sugar patterns and problems identified  Blood sugars are poorly controlled even with increasing her insulin on the last visit and switching to [place] instead of Levemir Despite metformin she appears to be still quite insulin resistant although most likely is ketosis resistant She has made changes in her diet reportedly but is still having blood sugars mostly over 400   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, recently has not skipped her cycle  DIZZINESS: Appears to be vertigo follow-up with PCP  PLAN:    She will be given a prescription for the Accu-Chek meter again, and this was given in writing  Will need to increase her insulin significantly  She appears to be insulin resistant and may benefit from using U-500 instead of NovoLog, discussed how this is different 100 insulin and reviewed the timing of the injection and dosage  Since her sugars are relatively high in the morning also she  will take the larger dose of 70 units before suppertime  Increase Tresiba by 20 units  Trial of Actos 15 mg daily for insulin resistance  She will see the diabetes educator in 1 week for follow-up   Check on islet cell antibodies, 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   Counseling time on subjects discussed in assessment and plan sections is over 50% of today's 25 minute visit   Elayne Snare 07/18/2017, 8:05 AM   Note: This note was prepared with Dragon voice recognition system technology. Any transcriptional errors that result from this process are unintentional.

## 2017-07-18 ENCOUNTER — Encounter: Payer: Self-pay | Admitting: Endocrinology

## 2017-07-18 ENCOUNTER — Other Ambulatory Visit: Payer: Self-pay

## 2017-07-18 ENCOUNTER — Ambulatory Visit (INDEPENDENT_AMBULATORY_CARE_PROVIDER_SITE_OTHER): Payer: Medicaid Other | Admitting: Endocrinology

## 2017-07-18 VITALS — BP 125/85 | HR 82 | Ht 65.0 in | Wt 224.0 lb

## 2017-07-18 DIAGNOSIS — Z794 Long term (current) use of insulin: Secondary | ICD-10-CM | POA: Diagnosis not present

## 2017-07-18 DIAGNOSIS — N914 Secondary oligomenorrhea: Secondary | ICD-10-CM | POA: Diagnosis not present

## 2017-07-18 DIAGNOSIS — E119 Type 2 diabetes mellitus without complications: Secondary | ICD-10-CM | POA: Diagnosis not present

## 2017-07-18 DIAGNOSIS — E1165 Type 2 diabetes mellitus with hyperglycemia: Secondary | ICD-10-CM | POA: Diagnosis not present

## 2017-07-18 DIAGNOSIS — IMO0001 Reserved for inherently not codable concepts without codable children: Secondary | ICD-10-CM

## 2017-07-18 LAB — LIPID PANEL
CHOLESTEROL: 186 mg/dL (ref 0–200)
HDL: 24.5 mg/dL — AB (ref >39.00)
Total CHOL/HDL Ratio: 8

## 2017-07-18 LAB — COMPREHENSIVE METABOLIC PANEL
ALT: 91 U/L — ABNORMAL HIGH (ref 0–35)
AST: 48 U/L — ABNORMAL HIGH (ref 0–37)
Albumin: 4.5 g/dL (ref 3.5–5.2)
Alkaline Phosphatase: 92 U/L (ref 39–117)
BUN: 11 mg/dL (ref 6–23)
CALCIUM: 9.3 mg/dL (ref 8.4–10.5)
CHLORIDE: 96 meq/L (ref 96–112)
CO2: 24 meq/L (ref 19–32)
CREATININE: 0.6 mg/dL (ref 0.40–1.20)
GFR: 134.01 mL/min (ref >60.00)
Glucose, Bld: 439 mg/dL — ABNORMAL HIGH (ref 70–99)
Potassium: 4 mEq/L (ref 3.5–5.1)
Sodium: 132 mEq/L — ABNORMAL LOW (ref 135–145)
Total Bilirubin: 0.3 mg/dL (ref 0.2–1.2)
Total Protein: 8.1 g/dL (ref 6.0–8.3)

## 2017-07-18 LAB — MICROALBUMIN / CREATININE URINE RATIO
Creatinine,U: 32.7 mg/dL
MICROALB/CREAT RATIO: 75.8 mg/g — AB (ref 0.0–30.0)
Microalb, Ur: 24.8 mg/dL — ABNORMAL HIGH (ref 0.0–1.9)

## 2017-07-18 LAB — URINALYSIS, ROUTINE W REFLEX MICROSCOPIC
BILIRUBIN URINE: NEGATIVE
Ketones, ur: 15 — AB
LEUKOCYTES UA: NEGATIVE
Nitrite: NEGATIVE
PH: 6 (ref 5.0–8.0)
RBC / HPF: NONE SEEN (ref 0–?)
SPECIFIC GRAVITY, URINE: 1.01 (ref 1.000–1.030)
Total Protein, Urine: 30 — AB
Urine Glucose: >=1000 — AB
Urobilinogen, UA: 0.2 (ref 0.0–1.0)

## 2017-07-18 LAB — GLUCOSE, POCT (MANUAL RESULT ENTRY): POC GLUCOSE: 488 mg/dL — AB (ref 70–99)

## 2017-07-18 LAB — LDL CHOLESTEROL, DIRECT: Direct LDL: 99 mg/dL

## 2017-07-18 MED ORDER — PIOGLITAZONE HCL 15 MG PO TABS: 15.0000 mg | ORAL_TABLET | Freq: Every day | ORAL | 3 refills | Status: DC

## 2017-07-18 MED ORDER — INSULIN REGULAR HUMAN (CONC) 500 UNIT/ML ~~LOC~~ SOPN: PEN_INJECTOR | SUBCUTANEOUS | 0 refills | Status: DC

## 2017-07-18 MED ORDER — GLUCOSE BLOOD VI STRP: ORAL_STRIP | 3 refills | Status: DC

## 2017-07-18 MED ORDER — ACCU-CHEK GUIDE ME W/DEVICE KIT: 1.0000 | PACK | Freq: Every day | 0 refills | Status: DC

## 2017-07-18 MED ORDER — ACCU-CHEK SOFTCLIX LANCETS MISC: 3 refills | Status: DC

## 2017-07-18 NOTE — Patient Instructions (Addendum)
100 Tresiba at bedtime  Novolog change to U 500, 50 units before Breakfast, 40 before lunch and 70 before supper

## 2017-07-19 ENCOUNTER — Telehealth: Payer: Self-pay | Admitting: Endocrinology

## 2017-07-19 ENCOUNTER — Telehealth: Payer: Self-pay

## 2017-07-19 LAB — GLUTAMIC ACID DECARBOXYLASE AUTO ABS

## 2017-07-19 NOTE — Telephone Encounter (Signed)
Initiated PA for pt to receive Humulin R U-500 insulin.  PA # is [id number] Interactive ID # is [id number]

## 2017-07-19 NOTE — Telephone Encounter (Signed)
Patient was seen yesterday and when she went to pharmacy and was told medicaid does not cover her insulin and if she needs it the MD will have to give her a LMN in order for it to get paid for [facility] phone number is [phone] without this her insulin will cost $1300

## 2017-07-19 NOTE — Telephone Encounter (Signed)
Pt would like the results of her blood work.

## 2017-07-19 NOTE — Telephone Encounter (Signed)
Her kidney test is okay, has a little protein in the urine which may get better with better control.  Cholesterol is okay, will discuss in detail on her next visit.  Female hormone level is still pending

## 2017-07-19 NOTE — Telephone Encounter (Signed)
Patient requests call to her at ph# [phone] re: her lab results

## 2017-07-19 NOTE — Telephone Encounter (Signed)
Spoke with [facility] And she stated that the patient was obviously quoted the cash price, and the medication does require a PA. Pharmacy is faxing the key for this to be done.

## 2017-07-20 ENCOUNTER — Other Ambulatory Visit: Payer: Self-pay

## 2017-07-20 LAB — TESTOSTERONE, FREE, TOTAL, SHBG
Sex Hormone Binding: 31.1 nmol/L (ref 24.6–122.0)
TESTOSTERONE FREE: 6 pg/mL — AB (ref 0.0–4.2)
Testosterone: 40 ng/dL (ref 8–48)

## 2017-07-20 MED ORDER — ACCU-CHEK FASTCLIX LANCETS MISC: 2 refills | Status: DC

## 2017-07-20 NOTE — Telephone Encounter (Signed)
Requested a call back to discuss lab results.  

## 2017-07-20 NOTE — Telephone Encounter (Signed)
Patient notified of result note from Dr. Lucianne MussKumar. Patient voiced understanding and had no further questions.

## 2017-07-22 ENCOUNTER — Telehealth: Payer: Self-pay | Admitting: Endocrinology

## 2017-07-22 ENCOUNTER — Other Ambulatory Visit: Payer: Self-pay

## 2017-07-22 MED ORDER — INSULIN REGULAR HUMAN (CONC) 500 UNIT/ML ~~LOC~~ SOLN: SUBCUTANEOUS | 3 refills | Status: DC

## 2017-07-22 MED ORDER — INSULIN SYRINGE/NEEDLE U-500 31G X 6MM 0.5 ML MISC: 1.0000 | Freq: Every day | 3 refills | Status: DC

## 2017-07-22 NOTE — Telephone Encounter (Signed)
Called this number back to follow up. Person I spoke with stated that [facility] has not provided them with a determination as of this time and they would need to be contacted for further information.

## 2017-07-22 NOTE — Telephone Encounter (Signed)
Keitrell calling from cover my meds [phone] REF [id number]  She is following up on the PA request for the humulin. What is the status?

## 2017-07-25 ENCOUNTER — Encounter: Payer: Self-pay | Admitting: Nutrition

## 2017-07-25 NOTE — Telephone Encounter (Signed)
Attempted to call pt again. She did not answer.  

## 2017-07-29 ENCOUNTER — Other Ambulatory Visit: Payer: Self-pay

## 2017-07-29 ENCOUNTER — Telehealth: Payer: Self-pay | Admitting: Endocrinology

## 2017-07-29 MED ORDER — PEN NEEDLES 30G X 5 MM MISC: 1.0000 | Freq: Every day | 3 refills | Status: DC

## 2017-07-29 MED ORDER — GABAPENTIN 600 MG PO TABS: 600.0000 mg | ORAL_TABLET | Freq: Three times a day (TID) | ORAL | 1 refills | Status: DC

## 2017-07-29 NOTE — Telephone Encounter (Signed)
Spoke with pt and informed her that an Rx has been sent for the Gabapentin, but we cannot know for sure if her insurance will cover the medication since it is not time to fill the medication. She also requested pen needles for her insulin pens. This has been sent.

## 2017-07-29 NOTE — Telephone Encounter (Signed)
gabapentin (NEURONTIN) 600 MG tablet      Patient stated she has missed placed her medication and pharmacy advised her to call our office to see if Dr Lucianne MussKumar could over ride and the pharmacy would fill.     [facility] [place] - [address]

## 2017-08-12 ENCOUNTER — Ambulatory Visit: Payer: Self-pay | Admitting: Endocrinology

## 2017-08-16 ENCOUNTER — Ambulatory Visit: Payer: Self-pay | Admitting: Endocrinology

## 2017-08-16 DIAGNOSIS — Z0289 Encounter for other administrative examinations: Secondary | ICD-10-CM

## 2017-09-13 ENCOUNTER — Other Ambulatory Visit: Payer: Self-pay | Admitting: Endocrinology

## 2017-09-28 ENCOUNTER — Telehealth: Payer: Self-pay | Admitting: Endocrinology

## 2017-09-28 NOTE — Telephone Encounter (Signed)
Patient called in to reschedule her missed appointment. I offered her appt tomorrow and Friday due to her saying her blood sugars were running high.  She said she couldn't do those days and couldn't wait till nov. She wanted a message sent back to the Dr to see when he could see her,  Please advise

## 2017-09-29 NOTE — Telephone Encounter (Signed)
Please offer her first available appointment before October 15.  Also let her know that if she has any no-shows we will dismiss her

## 2017-09-29 NOTE — Telephone Encounter (Signed)
Pt is set for Monday and is aware that she must keep appts in order to continue to be a patient here

## 2017-09-29 NOTE — Telephone Encounter (Signed)
Please advise on below  

## 2017-10-02 NOTE — Progress Notes (Signed)
Patient ID: Heather [name], female   DOB: 10-27-1995, 22 y.o.   MRN: [id number]          Reason for Appointment : Follow-up for  Diabetes  History of Present Illness   Referring provider: Dr. Jerene Bears         Diagnosis:  diabetes mellitus, date of diagnosis:   2004        Previous history:   At the time of diagnosis she was apparently not obese and started on metformin for treatment She also took insulin around the age of 64 and her blood sugars were significantly high but only for a couple of months No records are available about her previous level of control She did take glyburide during her first pregnancy and this apparently was effective including tendency to hypoglycemia She was again started on insulin when she got pregnant in December 2017 and was continued on insulin subsequently She was apparently given samples of Invokana by her PCP in early 2019 and she thinks her blood sugars were better with this, did not continue this subsequently  Recent history:     INSULIN regimen is: Tresiba 100 units hs, NovoLog 20 units ac 3 times a day, Humulin R 14 units twice a day with syringe  Current management, blood sugar patterns and problems identified:  She was switched from NovoLog to U-500 insulin on her last visit but she is still takes both insulins together  However she thinks that her appetite is markedly increased from [place] when she takes it in the evening and the dose was increased to 200 mg  Also is now taking pioglitazone for her PCOS and insulin resistance  However appears to have gained 30 pounds  She has been checking her blood sugar very sporadically as she lost her meter and did not use the other one that she has  Currently checking blood sugars erratically and these are mostly high with only occasional readings in the 200 range.  Also appears to have inconsistent results when she checks her blood sugars multiple times  In July she had a negative islet cell  antibody but 15 mg of ketones in urine   Glucose monitoring: 0-3 times a day         Glucometer:  Accucheck   Blood sugar readings as above Recent blood sugar average 335  Self-care: The diet that the patient has been following is: Avoiding food and regular soft drinks         Exercise: none          Dietician consultation: Most recent: 10/2016  .         CDE consultation: None  Wt Readings from Last 3 Encounters:  10/03/17 254 lb (115.2 kg)  07/18/17 224 lb (101.6 kg)  05/05/17 224 lb (101.6 kg)   Diabetes labs:  Lab Results  Component Value Date   HGBA1C 13.2 05/05/2017   HGBA1C 11.9 (H) 08/27/2016   HGBA1C 8.5 12/23/2015   Lab Results  Component Value Date   MICROALBUR 24.8 (H) 07/18/2017   LDLCALC 86 04/10/2013   CREATININE 0.46 10/03/2017    Lab Results  Component Value Date   MICRALBCREAT 75.8 (H) 07/18/2017     Allergies as of 10/03/2017      Reactions   Prozac [fluoxetine Hcl]    Stiffness in neck, "stroke like" symptoms   Cephalosporins Other (See Comments)   Unknown childhood reaction   Latuda [lurasidone Hcl]    Tongue, confusion  Medication List        Accurate as of 10/03/17 10:53 AM. Always use your most recent med list.          ACCU-CHEK FASTCLIX LANCETS Misc Used to check blood sugars up to four times daily.   ACCU-CHEK GUIDE ME w/Device Kit 1 each by Does not apply route daily. USE METER TO CHECK BLOOD SUGAR 4 TIMES DAILY. DX:E11.65   clonazePAM 1 MG tablet Commonly known as:  KLONOPIN Take 1 mg by mouth 3 (three) times daily.   gabapentin 600 MG tablet Commonly known as:  NEURONTIN TAKE 1 TABLET BY MOUTH THREE TIMES DAILY   glucose blood test strip Use as instructed to check blood sugar 4 times daily. DX:E11.65   Insulin Degludec 200 UNIT/ML Sopn Inject 70 Units into the skin daily.   insulin regular human CONCENTRATED 500 UNIT/ML injection Commonly known as:  HUMULIN R INJECT 14 UNITS (AS MEASURED ON A U-500  SYRINGE) 3 TIMES DAILY WITH MEALS   Insulin Syringe/Needle U-500 31G X 6MM 0.5 ML Misc 1 each by Does not apply route daily. USE INSULIN SYRINGE TO INJECT INSULIN 3 TIMES DAILY.   metFORMIN 500 MG 24 hr tablet Commonly known as:  GLUCOPHAGE-XR Take 4 tablets (2,000 mg total) by mouth daily with supper.   Pen Needles 30G X 5 MM Misc 1 each by Does not apply route daily. USE 4 PEN NEEDLES DAILY FOR INSULIN ADMINISTRATION.   pioglitazone 15 MG tablet Commonly known as:  ACTOS Take 1 tablet (15 mg total) by mouth daily.       Allergies:  Allergies  Allergen Reactions  . Prozac [Fluoxetine Hcl]     Stiffness in neck, "stroke like" symptoms  . Cephalosporins Other (See Comments)    Unknown childhood reaction  . Latuda [Lurasidone Hcl]     Tongue, confusion     Past Medical History:  Diagnosis Date  . ADHD (attention deficit hyperactivity disorder)   . Anemia   . Asthma    as a child  . Bipolar 1 disorder ([place])   . Diabetes mellitus    type 2  . Diabetic neuropathy ([place])   . Fatty liver   . GHTN vs. CHTN 06/11/2015  . Hidradenitis 06/05/2012  . History of hiatal hernia   . Irregular bleeding 06/05/2012  . Mononucleosis january 2014  . Nausea and vomiting 01/21/2015  . Obesity (BMI 30-39.9)   . Other and unspecified ovarian cyst 07/24/2012   Right  Ovarian cyst 8 cm on [place] 7/15 in ER  . Pneumonia   . Pregnant 01/21/2015  . Schizo affective schizophrenia ([place])   . Seizures ([place])    febrile seizures as a child  . Sleep apnea    no cpap    Past Surgical History:  Procedure Laterality Date  . ADENOIDECTOMY    . ANTERIOR CRUCIATE LIGAMENT REPAIR    . 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 TONSILLECTOMY    . WISDOM TOOTH EXTRACTION      Family History  Problem Relation Age of Onset  . Diabetes Mother   . Hypertension Mother   . Bipolar disorder Father   . Bipolar disorder Brother   . Anxiety  disorder Brother   . Asthma Sister        intrinsic  . Stroke Maternal Grandmother   . Cancer Other  colon, lung, brain-paternal side    Social History:  reports that she has never smoked. She has never used smokeless tobacco. She reports that she does not drink alcohol or use drugs.      Review of Systems      Lipids: Has had high triglycerides  Lab Results  Component Value Date   CHOL 186 07/18/2017   HDL 24.50 (L) 07/18/2017   LDLCALC 86 04/10/2013   LDLDIRECT 99.0 07/18/2017   TRIG (H) 07/18/2017    543.0 Triglyceride is over 400; calculations on Lipids are invalid.   CHOLHDL 8 07/18/2017   PCOS: Her menstrual cycles are coming about a week late recently although may be heavy She does have a high free testosterone level prior to starting Actos  UTI?:  For the last 2 days she is having difficulty emptying her bladder, frequent urination and occasionally blood in the urine Has only mild discomfort and has not discussed with PCP  Her neuropathic symptoms are controlled with increasing gabapentin to 600 mg  LABS:  Office Visit on 10/03/2017  Component Date Value Ref Range Status  . Sodium 10/03/2017 134* 135 - 145 mEq/L Final  . Potassium 10/03/2017 4.0  3.5 - 5.1 mEq/L Final  . Chloride 10/03/2017 99  96 - 112 mEq/L Final  . CO2 10/03/2017 23  19 - 32 mEq/L Final  . Glucose, Bld 10/03/2017 265* 70 - 99 mg/dL Final  . BUN 10/03/2017 15  6 - 23 mg/dL Final  . Creatinine, Ser 10/03/2017 0.46  0.40 - 1.20 mg/dL Final  . Calcium 10/03/2017 9.3  8.4 - 10.5 mg/dL Final  . GFR 10/03/2017 181.73  >60.00 mL/min Final  . Color, Urine 10/03/2017 YELLOW  Yellow;Lt. Yellow Final  . APPearance 10/03/2017 Sl Cloudy* Clear Final  . Specific Gravity, Urine 10/03/2017 >=1.030* 1.000 - 1.030 Final  . pH 10/03/2017 5.5  5.0 - 8.0 Final  . Total Protein, Urine 10/03/2017 NEGATIVE  Negative Final  . Urine Glucose 10/03/2017 250* Negative Final  . Ketones, ur 10/03/2017  NEGATIVE  Negative Final  . Bilirubin Urine 10/03/2017 NEGATIVE  Negative Final  . Hgb urine dipstick 10/03/2017 SMALL* Negative Final  . Urobilinogen, UA 10/03/2017 0.2  0.0 - 1.0 Final  . Leukocytes, UA 10/03/2017 TRACE* Negative Final  . Nitrite 10/03/2017 NEGATIVE  Negative Final  . WBC, UA 10/03/2017 3-6/hpf* 0-2/hpf Final  . RBC / HPF 10/03/2017 0-2/hpf  0-2/hpf Final  . Mucus, UA 10/03/2017 Presence of* None Final  . Squamous Epithelial / LPF 10/03/2017 Few(5-10/hpf)* Rare(0-4/hpf) Final  . Bacteria, UA 10/03/2017 Few(10-50/hpf)* None Final    Physical Examination:   BP 140/88 (BP Location: Right Arm, Patient Position: Sitting, Cuff Size: Normal)   Pulse 86   Ht _0  (1.651 m)   Wt 254 lb (115.2 kg)   BMI 42.27 kg/m       ASSESSMENT:  Diabetes type 2, childhood onset, current BMI 37  See history of present illness for detailed discussion of current diabetes management, blood sugar patterns and problems identified  Her last A1c was 13  She has significant insulin resistance requiring large doses of insulin without recent improvement despite using 100 units of Tresiba, the equivalent of 70 units of Humulin R along with a trial of Actos and metformin She was supposed to take only Humulin R and not NovoLog which she is taking both on the taking only relatively small doses of NovoLog Even with current regimen her fasting readings are mostly high but she  does not check these consistently and most likely has more postprandial hyperglycemia when she goes off her diet at night Not doing much exercise   Blood sugars are overall still poorly controlled She has gained any unusual amount of weight and she thinks that Tyler Aas is making her excessively hungry in the evenings but this is unlikely Actos may be a factor in her weight gain   PCOS with history of oligomenorrhea and acanthosis, needs to continue metformin but also needs significant weight loss  Probable cystitis: We  will check urinalysis today, however may possibly have a neurogenic bladder with her history of neuropathy also  PLAN:    She will be increasing her Humulin R significantly to 25 in the morning and 35 at suppertime with leaving off NovoLog  She can try splitting of the [place] to 50 units twice daily but may reduce the dose if morning sugars are coming down significantly  Stop Actos since this may be causing some weight gain  More consistent glucose monitoring 4 times a day and discussed blood sugar targets  Regular exercise  More consistent follow-up in the office  Would also benefit from consultation with dietitian again  Since it is unclear whether she has a UTI currently will not try Jardiance but most likely will need to try this, she does have previous history of benefit from [facility]  Patient Instructions  Stop Actos  Tresiba 50 am 50 at nite  Humulin R  25 in am and 35 at supper, no Novolog    Counseling time on subjects discussed in assessment and plan sections is over 50% of today's 25 minute visit   Heather [name] 10/03/2017, 10:53 AM   Note: This note was prepared with Dragon voice recognition system technology. Any transcriptional errors that result from this process are unintentional.  ADDENDUM: Urinalysis shows only 2-4 WBCs microscopic and trace leukocytes.  Needs to see PCP for further evaluation   Heather [name]

## 2017-10-03 ENCOUNTER — Telehealth: Payer: Self-pay | Admitting: Endocrinology

## 2017-10-03 ENCOUNTER — Other Ambulatory Visit: Payer: Self-pay | Admitting: Endocrinology

## 2017-10-03 ENCOUNTER — Ambulatory Visit (INDEPENDENT_AMBULATORY_CARE_PROVIDER_SITE_OTHER): Payer: Medicaid Other | Admitting: Endocrinology

## 2017-10-03 VITALS — BP 140/88 | HR 86 | Ht 65.0 in | Wt 254.0 lb

## 2017-10-03 DIAGNOSIS — N3090 Cystitis, unspecified without hematuria: Secondary | ICD-10-CM

## 2017-10-03 DIAGNOSIS — E282 Polycystic ovarian syndrome: Secondary | ICD-10-CM

## 2017-10-03 DIAGNOSIS — E1165 Type 2 diabetes mellitus with hyperglycemia: Secondary | ICD-10-CM

## 2017-10-03 DIAGNOSIS — Z794 Long term (current) use of insulin: Secondary | ICD-10-CM

## 2017-10-03 LAB — BASIC METABOLIC PANEL
BUN: 15 mg/dL (ref 6–23)
CALCIUM: 9.3 mg/dL (ref 8.4–10.5)
CO2: 23 mEq/L (ref 19–32)
CREATININE: 0.46 mg/dL (ref 0.40–1.20)
Chloride: 99 mEq/L (ref 96–112)
GFR: 181.73 mL/min (ref >60.00)
Glucose, Bld: 265 mg/dL — ABNORMAL HIGH (ref 70–99)
Potassium: 4 mEq/L (ref 3.5–5.1)
Sodium: 134 mEq/L — ABNORMAL LOW (ref 135–145)

## 2017-10-03 LAB — URINALYSIS, ROUTINE W REFLEX MICROSCOPIC
Bilirubin Urine: NEGATIVE
Ketones, ur: NEGATIVE
Nitrite: NEGATIVE
PH: 5.5 (ref 5.0–8.0)
TOTAL PROTEIN, URINE-UPE24: NEGATIVE
UROBILINOGEN UA: 0.2 (ref 0.0–1.0)
Urine Glucose: 250 — AB

## 2017-10-03 NOTE — Patient Instructions (Addendum)
Stop Actos  Tresiba 50 am 50 at nite  Humulin R  25 in am and 35 at supper, no Novolog

## 2017-10-03 NOTE — Telephone Encounter (Signed)
Please let her know that urinalysis does not show any clear-cut infection and does not explain her symptoms.  Needs to see PCP today, results have been sent

## 2017-10-03 NOTE — Telephone Encounter (Signed)
Need to wait till evaluation from PCP, may need urine culture

## 2017-10-03 NOTE — Telephone Encounter (Signed)
Patient called stated she is returning someone's call she also has questions about whether or not she needs to be on an antibiotic or not please advise

## 2017-10-03 NOTE — Telephone Encounter (Signed)
Notified about lab results with Dr. Lucianne Muss instructions. Pt stated that if the urinalysis--negative results Dr. Lucianne Muss will write Rx Invokana....please advise

## 2017-10-04 ENCOUNTER — Telehealth: Payer: Self-pay | Admitting: Endocrinology

## 2017-10-04 LAB — FRUCTOSAMINE: Fructosamine: 370 umol/L — ABNORMAL HIGH (ref 0–285)

## 2017-10-04 NOTE — Telephone Encounter (Signed)
Notified pt --need evaluation by PCP may need urine culture before prescribing medication... Pt questioning about the blood  results...please review/advise

## 2017-10-04 NOTE — Telephone Encounter (Signed)
Pt is calling in regards to wanting HCG test results. Ph # [phone]

## 2017-10-04 NOTE — Telephone Encounter (Signed)
Pt is convinced that she was told her hCG levels are high and it was related to PCOS. I see noted no where in regards to this. I informed the patient I think it was a misunderstanding of acronyms. I went through all of what I seen in her chart and she stated she had no questions about anything I went over. I was apologetic towards any confusion but I could not see where a telephone call was placed or results in her chart discussing hCG levels.

## 2017-10-04 NOTE — Telephone Encounter (Signed)
I do not see HCG levels in chart, is this a lab needing to be added? Please advise

## 2017-10-04 NOTE — Telephone Encounter (Signed)
There was no discussion about checking hCG level on her last visit

## 2017-10-04 NOTE — Telephone Encounter (Signed)
Kidney function okay on the labs, needs to discuss bladder symptoms with PCP

## 2017-10-04 NOTE — Telephone Encounter (Signed)
Notified pt about additional lab results--kidney function--normal and pt is aware needed follow up with PCP.

## 2017-10-07 ENCOUNTER — Telehealth: Payer: Self-pay | Admitting: Endocrinology

## 2017-10-07 MED ORDER — EMPAGLIFLOZIN 10 MG PO TABS: 10.0000 mg | ORAL_TABLET | Freq: Every day | ORAL | 1 refills | Status: DC

## 2017-10-07 MED ORDER — DULOXETINE HCL 30 MG PO CPEP: 30.0000 mg | ORAL_CAPSULE | Freq: Every day | ORAL | 0 refills | Status: DC

## 2017-10-07 NOTE — Telephone Encounter (Signed)
We need to know if she had a urinary tract infection diagnosed by her PCP.  If not we will send her Jardiance 10 mg daily Gabapentin cannot be increased.  Send prescription for duloxetine 30 mg daily with food to take with gabapentin

## 2017-10-07 NOTE — Telephone Encounter (Signed)
Sent both prescriptions for the pt and she is aware

## 2017-10-07 NOTE — Telephone Encounter (Signed)
She stated her PCP sent over the results to [place], and that she had no sign of infection per the PCP

## 2017-10-07 NOTE — Telephone Encounter (Signed)
Pt stated that she would like to know if she can start the medication discussed at the last visit, she though that it was called invokana.   Pt also wants to know her A1C I told her a Fructosamine was done but it does not state her actual A1C.   Gabapentin she states is not covering all of the pain she is having with her feet. She said they are red on the bottom and "hurt really bad" she would like to ask for a dose increase.

## 2017-10-07 NOTE — Telephone Encounter (Signed)
Patient called stating they want there results for labs.

## 2017-10-07 NOTE — Telephone Encounter (Signed)
I have not seen any results from PCP.  Okay to send Jardiance 10 mg daily

## 2017-10-12 ENCOUNTER — Telehealth: Payer: Self-pay | Admitting: Endocrinology

## 2017-10-12 NOTE — Telephone Encounter (Signed)
Patient is stating that her urine has white flacks and is greasy looking, also her blood sugar is still in the 400's. Ph # [phone]

## 2017-10-13 NOTE — Telephone Encounter (Signed)
Please advise 

## 2017-10-13 NOTE — Telephone Encounter (Signed)
She will need to double the doses of the Humulin R insulin to 50 units in the morning and 70 at suppertime.  Needs to follow-up with PCP regarding urinary symptoms.  Has she started Jardiance?

## 2017-10-14 NOTE — Telephone Encounter (Signed)
Pt stated went to her PCP--protein and sugar in urine elevated....and have not started the Rx jardiance she thought Dr Lucianne Muss sending Rx Invokana instead of Jardiance. Please advise . Pt understand DrMarland Kitchen Lucianne Muss instructions

## 2017-10-24 ENCOUNTER — Ambulatory Visit: Payer: Self-pay | Admitting: Internal Medicine

## 2017-10-25 ENCOUNTER — Ambulatory Visit: Payer: Self-pay | Admitting: Internal Medicine

## 2017-10-25 DIAGNOSIS — Z0289 Encounter for other administrative examinations: Secondary | ICD-10-CM

## 2017-10-25 NOTE — Progress Notes (Deleted)
Name: Heather [name]  Age/ Sex: 22 y.o., female   MRN/ DOB: [id number], 12-Sep-1995     PCP: [facility], [facility]   Reason for Endocrinology Evaluation: Type 2 Diabetes Mellitus    PATIENT IDENTIFIER: Heather [name] is a 22 y.o. female with a past medical history of ADHD, Bipolar disorder, fatty liver and T2DM. The patient has followed with Endocrinology clinic since 09/2017 for consultative assistance with management of her diabetes.  DIABETIC HISTORY:  Heather [name] was diagnosed with T2DM in 2004, and started insulin therapy approximately 6 years after diagnosis. She was initially treated with just Metofrmin. She was on Glyburide during her first pregnancy which was sufficient to control her glucose. Insulin was restarted during her second pregnancy in 2017.She wa also tried on SGLT-2inhibitor   Her hemoglobin A1c has ranged from 6.8% in 2014, peaking at 13.2 %in 2019.  On her initial visit to our clinic she was on Metformin, Actos, Humulin- U 500 and Tresiba  SUBJECTIVE:   During the last visit (10/03/2017): Her A1c was 13.0% . Humulin-R was increased to 25 units with Breakfast and 35 units with Supper. Continued on Tresiba 50 units BID. Actos was stopped.   Today (10/25/2017): Heather [name]  She checks her blood sugars *** times daily, preprandial to breakfast and ***. She reports preprandial blood sugars as follows: Breakfast (*** mg/dL), lunch (*** mg/dL), dinner (*** mg/dL). The patient {has/has not:9025} had hypoglycemic episodes since the last clinic visit, which typically occur {NUMBER 1-12:12132} x / {TIME:10110} - most often occuring {Time; all day/intermittent:31009}. The patient {ACTIONS; IS/NOT:17669} symptomatic with these episodes, with symptoms of {symptoms; hypoglycemia:9084048}. Otherwise, the patient {HAS/HAS NOT:522402} required any recent emergency interventions for hypoglycemia and {HAS/HAS  NOT:522402} had recent hospitalizations secondary to hyper or hypoglycemic episodes.    ROS: As per HPI and as detailed below: ROS    HOME DIABETES REGIMEN:  Tresiba Humalog U-500    Statin: No ACE-I/ARB:No Prior Diabetic Education:10/2016   METER DOWNLOAD SUMMARY: Date range evaluated: *** Fingerstick Blood Glucose Tests = *** Average Number Tests/Day = *** Overall Mean FS Glucose = *** Standard Deviation = ***  BG Ranges: Low = *** High = ***   Hypoglycemic Events/30 Days: BG < 50 = *** Episodes of symptomatic severe hypoglycemia = ***    DIABETIC COMPLICATIONS: Microvascular complications:   ***  Denies:   Last Eye Exam: Completed   Macrovascular complications:    Denies: CAD, CVA, PVD   HISTORY:  Past Medical History:  Past Medical History:  Diagnosis Date  . ADHD (attention deficit hyperactivity disorder)   . Anemia   . Asthma    as a child  . Bipolar 1 disorder ([place])   . Diabetes mellitus    type 2  . Diabetic neuropathy ([place])   . Fatty liver   . GHTN vs. CHTN 06/11/2015  . Hidradenitis 06/05/2012  . History of hiatal hernia   . Irregular bleeding 06/05/2012  . Mononucleosis january 2014  . Nausea and vomiting 01/21/2015  . Obesity (BMI 30-39.9)   . Other and unspecified ovarian cyst 07/24/2012   Right  Ovarian cyst 8 cm on [place] 7/15 in ER  . Pneumonia   . Pregnant 01/21/2015  . Schizo affective schizophrenia ([place])   . Seizures ([place])    febrile seizures as a child  . Sleep apnea    no cpap    Past Surgical History:  Past Surgical History:  Procedure Laterality Date  . ADENOIDECTOMY    .  ANTERIOR CRUCIATE LIGAMENT REPAIR    . 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 TONSILLECTOMY    . WISDOM TOOTH EXTRACTION       Social History:  reports that she has never smoked. She has never used smokeless tobacco. She reports that she does not drink alcohol or use  drugs. Family History:  Family History  Problem Relation Age of Onset  . Diabetes Mother   . Hypertension Mother   . Bipolar disorder Father   . Bipolar disorder Brother   . Anxiety disorder Brother   . Asthma Sister        intrinsic  . Stroke Maternal Grandmother   . Cancer Other        colon, lung, brain-paternal side      HOME MEDICATIONS: Allergies as of 10/25/2017      Reactions   Prozac [fluoxetine Hcl]    Stiffness in neck, "stroke like" symptoms   Cephalosporins Other (See Comments)   Unknown childhood reaction   Latuda [lurasidone Hcl]    Tongue, confusion       Medication List        Accurate as of 10/25/17  7:50 AM. Always use your most recent med list.          ACCU-CHEK FASTCLIX LANCETS Misc Used to check blood sugars up to four times daily.   ACCU-CHEK GUIDE ME w/Device Kit 1 each by Does not apply route daily. USE METER TO CHECK BLOOD SUGAR 4 TIMES DAILY. DX:E11.65   clonazePAM 1 MG tablet Commonly known as:  KLONOPIN Take 1 mg by mouth 3 (three) times daily.   DULoxetine 30 MG capsule Commonly known as:  CYMBALTA Take 1 capsule (30 mg total) by mouth daily. Along with Gabapentin   empagliflozin 10 MG Tabs tablet Commonly known as:  JARDIANCE Take 10 mg by mouth daily.   gabapentin 600 MG tablet Commonly known as:  NEURONTIN TAKE 1 TABLET BY MOUTH THREE TIMES DAILY   glucose blood test strip Use as instructed to check blood sugar 4 times daily. DX:E11.65   HUMULIN R U-500 KWIKPEN 500 UNIT/ML kwikpen Generic drug:  insulin regular human CONCENTRATED INJECT 70 UNITS 30 MINUTES BEFORE MEALS   Insulin Degludec 200 UNIT/ML Sopn Inject 70 Units into the skin daily.   Insulin Syringe/Needle U-500 31G X 6MM 0.5 ML Misc 1 each by Does not apply route daily. USE INSULIN SYRINGE TO INJECT INSULIN 3 TIMES DAILY.   metFORMIN 500 MG 24 hr tablet Commonly known as:  GLUCOPHAGE-XR Take 4 tablets (2,000 mg total) by mouth daily with supper.    Pen Needles 30G X 5 MM Misc 1 each by Does not apply route daily. USE 4 PEN NEEDLES DAILY FOR INSULIN ADMINISTRATION.   pioglitazone 15 MG tablet Commonly known as:  ACTOS Take 1 tablet (15 mg total) by mouth daily.        OBJECTIVE:   Vital Signs: There were no vitals taken for this visit.   Exam: General: Pt appears well and is in NAD  Hydration: Well-hydrated with moist mucous membranes and good skin turgor  HEENT: Head: Unremarkable with good dentition. Oropharynx clear without exudate.  Eyes: External eye exam normal without stare, lid lag or exophthalmos.  EOM intact.  PERRL.  Neck: General: Supple without adenopathy. Thyroid: Thyroid size normal.  No goiter or nodules appreciated. No thyroid bruit.  Lungs: Clear with  good BS bilat with no rales, rhonchi, or wheezes  Heart: RRR with normal S1 and S2 and no gallops; no murmurs; no rub  Abdomen: Normoactive bowel sounds, soft, nontender, without masses or organomegaly palpable  Extremities: No pretibial edema. No tremor. Normal strength and motion throughout. See detailed diabetic foot exam below.  Skin: Normal texture and temperature to palpation. No rash noted. No Acanthosis nigricans/skin tags. No lipohypertrophy.  Neuro: MS is good with appropriate affect, pt is alert and Ox3     DATA REVIEWED: A1c:   ASSESSMENT / PLAN / RECOMMENDATIONS:   1) Type 2 Diabetes Mellitus, poorly controlled, With *** complications - Most recent A1c of *** %. Goal A1c < *** %.  ***  Plan: MEDICATIONS:  ***  EDUCATION / INSTRUCTIONS:  BG monitoring instructions: Patient is instructed to check her blood sugars 3-4 times a day, before meals and at bedtime.  Call [facility] if: BG persistently < 70 or > 300. . I reviewed the Rule of 15 for the treatment of hypoglycemia in detail with the patient. Literature supplied.    2) Diabetic complications:   Eye: Does *** have known diabetic retinopathy. Last eye exam was  *** 1 year ago.   Neuro/ Feet: Does *** have known diabetic peripheral neuropathy .   Renal: Patient does *** have known baseline CKD. Last SCr on @LABRSLTLINEBRIEFNR (Creatinine)@. Last microalb/cr ***. She   is ***on an ACEI/ARB at present.   3) Lipids: She has hypertriglyceridemia. Discussed  Low fat diet  4) Hypertension: *** at goal of < 140/90 mmHg.    F/U in ***    Signed electronically by: Mack Guise, MD  [facility]  [facility] [address] [place], [place] [address] Phone: [phone] FAX: [phone]   CC: [facility], [facility] [place] [address] Phone: [phone]  Fax: [phone]  Return to Endocrinology clinic as below: Future Appointments  Date Time Provider [place]  10/25/2017  8:30 AM Shammleffer, Melanie Crazier, MD [facility] None  11/29/2017 11:00 AM Elayne Snare, MD [facility] None

## 2017-11-06 ENCOUNTER — Other Ambulatory Visit: Payer: Self-pay | Admitting: Endocrinology

## 2017-11-11 LAB — GLUCOSE, POCT (MANUAL RESULT ENTRY): POC GLUCOSE: 339 mg/dL — AB (ref 70–99)

## 2017-11-28 ENCOUNTER — Telehealth: Payer: Self-pay | Admitting: Endocrinology

## 2017-11-28 NOTE — Telephone Encounter (Signed)
Patient requests new RX for Humalin (patient dropped and broke), syringes and needles, AND Gabapentin (RX was lost) [facility] [place] [facility] [place]

## 2017-11-28 NOTE — Telephone Encounter (Signed)
Please advise 

## 2017-11-28 NOTE — Telephone Encounter (Signed)
Refill or refuse please advise 

## 2017-11-28 NOTE — Telephone Encounter (Signed)
Okay to refill? 

## 2017-11-29 ENCOUNTER — Ambulatory Visit: Payer: Self-pay | Admitting: Endocrinology

## 2017-11-29 ENCOUNTER — Other Ambulatory Visit: Payer: Self-pay

## 2017-11-29 ENCOUNTER — Telehealth: Payer: Self-pay | Admitting: Endocrinology

## 2017-11-29 MED ORDER — INSULIN SYRINGE/NEEDLE U-500 31G X 6MM 0.5 ML MISC: 1.0000 | Freq: Every day | 0 refills | Status: DC

## 2017-11-29 MED ORDER — INSULIN REGULAR HUMAN (CONC) 500 UNIT/ML ~~LOC~~ SOPN: PEN_INJECTOR | SUBCUTANEOUS | 0 refills | Status: DC

## 2017-11-29 MED ORDER — GABAPENTIN 600 MG PO TABS: 600.0000 mg | ORAL_TABLET | Freq: Three times a day (TID) | ORAL | 0 refills | Status: DC

## 2017-11-29 MED ORDER — PEN NEEDLES 30G X 5 MM MISC: 1.0000 | Freq: Every day | 0 refills | Status: DC

## 2017-11-29 NOTE — Telephone Encounter (Signed)
Do you want to continue with your dose of allow the other provider to continue filling it?

## 2017-11-29 NOTE — Telephone Encounter (Signed)
Called pt and left detailed vm informing her that medications have been refilled.

## 2017-11-29 NOTE — Telephone Encounter (Signed)
Called pt and notified her that Dr. Lucianne MussKumar will no longer be refilling Gabapentin r/t getting 800mg  TID from another provider. Pt stated that this was an old prescription from her previous weight management doctor and that she is no longer seeing this provider and she would like Dr. Lucianne MussKumar to handle it. I informed pt that the doctor that prescribed the 800mg  would need to contact this office and inform Dr. Remus BlakeKumar's staff that he/she will no longer be prescribing the Gabapentin in order for Dr. Lucianne MussKumar to resume the prescription.

## 2017-11-29 NOTE — Telephone Encounter (Signed)
Pharmacy called regarding 30 gauge versus 31 gauge needles.  Also has question regarding Neurontin 600 mg 3x a day and she is already on 800 mg 3x a day from another prescriber.  Pharmacy is concerned about amount of medication.  Ph: [phone] option 0

## 2017-11-29 NOTE — Telephone Encounter (Signed)
She should not be taking more than 2400 mg of gabapentin per day

## 2017-11-29 NOTE — Telephone Encounter (Signed)
We will refuse prescription from here

## 2017-11-29 NOTE — Telephone Encounter (Signed)
Called pharmacist and informed her that she could fill the 31 gauge needles since their pharmacy does not stock the 30 gauge.  Pharmacist also stated that pt has bee receiving 800mg  PO TID from another provider and has been having it filled at a different pharmacy and the last fill date was on 10/24/17. Pharmacist would like to know if you want to proceed with your authorization to refill.

## 2017-11-29 NOTE — Telephone Encounter (Signed)
Noted  

## 2017-11-29 NOTE — Telephone Encounter (Signed)
I had sent a message yesterday approving refilling all these prescriptions

## 2017-11-29 NOTE — Telephone Encounter (Signed)
Pt would like the gabapentin refilled and stated that she lost the RX. Would you refill this? She also canceled her appt today and rescheduled for tomorrow at 4.

## 2017-11-29 NOTE — Telephone Encounter (Signed)
Pharmacy told patient they have not received the RX's for Humalin, needles and syringes AND Gabapentin 600. Patient requests that the Pharmacy be called on the phone to fill the above RX's-patient is out of medication. [facility]acy is [facility] [place] ph# [phone].

## 2017-11-30 ENCOUNTER — Ambulatory Visit: Payer: Self-pay | Admitting: Endocrinology

## 2017-11-30 DIAGNOSIS — Z0289 Encounter for other administrative examinations: Secondary | ICD-10-CM

## 2017-12-06 ENCOUNTER — Ambulatory Visit: Payer: Medicaid Other | Admitting: Podiatry

## 2017-12-18 ENCOUNTER — Other Ambulatory Visit: Payer: Self-pay | Admitting: Endocrinology

## 2017-12-24 ENCOUNTER — Other Ambulatory Visit: Payer: Self-pay | Admitting: Endocrinology

## 2017-12-29 ENCOUNTER — Telehealth: Payer: Self-pay | Admitting: Endocrinology

## 2017-12-29 NOTE — Telephone Encounter (Signed)
CoverMyMeds was called back and informed that the patient is non-compliant and the MD will not be doing any PA's or refills until the pt is seen in the office for a follow up visit.

## 2017-12-29 NOTE — Telephone Encounter (Signed)
Cover My Meds called requesting a status update for a prior authorization for [employer] FlexTouch.  Was sent to us 12/13/17.  Call back # [phone] REf# [id number]

## 2018-01-16 ENCOUNTER — Observation Stay (HOSPITAL_COMMUNITY)
Admission: EM | Admit: 2018-01-16 | Discharge: 2018-01-18 | Disposition: A | Discharge status: Home / Self Care | Payer: Medicaid Other | Attending: Internal Medicine | Admitting: Internal Medicine

## 2018-01-16 ENCOUNTER — Other Ambulatory Visit: Payer: Self-pay

## 2018-01-16 DIAGNOSIS — E1165 Type 2 diabetes mellitus with hyperglycemia: Secondary | ICD-10-CM | POA: Insufficient documentation

## 2018-01-16 DIAGNOSIS — Z794 Long term (current) use of insulin: Secondary | ICD-10-CM | POA: Insufficient documentation

## 2018-01-16 DIAGNOSIS — I10 Essential (primary) hypertension: Secondary | ICD-10-CM | POA: Diagnosis not present

## 2018-01-16 DIAGNOSIS — L02416 Cutaneous abscess of left lower limb: Secondary | ICD-10-CM

## 2018-01-16 DIAGNOSIS — J45909 Unspecified asthma, uncomplicated: Secondary | ICD-10-CM | POA: Diagnosis not present

## 2018-01-16 DIAGNOSIS — Z79899 Other long term (current) drug therapy: Secondary | ICD-10-CM | POA: Diagnosis not present

## 2018-01-16 DIAGNOSIS — M65052 Abscess of tendon sheath, left thigh: Principal | ICD-10-CM | POA: Insufficient documentation

## 2018-01-16 DIAGNOSIS — L0390 Cellulitis, unspecified: Secondary | ICD-10-CM | POA: Diagnosis present

## 2018-01-16 DIAGNOSIS — N61 Mastitis without abscess: Secondary | ICD-10-CM | POA: Diagnosis not present

## 2018-01-16 DIAGNOSIS — R739 Hyperglycemia, unspecified: Secondary | ICD-10-CM

## 2018-01-16 DIAGNOSIS — L0291 Cutaneous abscess, unspecified: Secondary | ICD-10-CM

## 2018-01-16 DIAGNOSIS — L03116 Cellulitis of left lower limb: Secondary | ICD-10-CM | POA: Diagnosis not present

## 2018-01-16 DIAGNOSIS — N611 Abscess of the breast and nipple: Secondary | ICD-10-CM

## 2018-01-16 DIAGNOSIS — M79652 Pain in left thigh: Secondary | ICD-10-CM | POA: Diagnosis present

## 2018-01-16 LAB — CBC WITH DIFFERENTIAL/PLATELET
Abs Immature Granulocytes: 0.03 10*3/uL (ref 0.00–0.07)
Basophils Absolute: 0 10*3/uL (ref 0.0–0.1)
Basophils Relative: 0 %
Eosinophils Absolute: 0.1 10*3/uL (ref 0.0–0.5)
Eosinophils Relative: 1 %
HCT: 44 % (ref 36.0–46.0)
Hemoglobin: 13.7 g/dL (ref 12.0–15.0)
IMMATURE GRANULOCYTES: 0 %
Lymphocytes Relative: 31 %
Lymphs Abs: 3.2 10*3/uL (ref 0.7–4.0)
MCH: 25.9 pg — ABNORMAL LOW (ref 26.0–34.0)
MCHC: 31.1 g/dL (ref 30.0–36.0)
MCV: 83.3 fL (ref 80.0–100.0)
Monocytes Absolute: 0.6 10*3/uL (ref 0.1–1.0)
Monocytes Relative: 6 %
NEUTROS PCT: 62 %
Neutro Abs: 6.3 10*3/uL (ref 1.7–7.7)
Platelets: 197 10*3/uL (ref 150–400)
RBC: 5.28 MIL/uL — ABNORMAL HIGH (ref 3.87–5.11)
RDW: 13.8 % (ref 11.5–15.5)
WBC: 10.3 10*3/uL (ref 4.0–10.5)
nRBC: 0 % (ref 0.0–0.2)

## 2018-01-16 LAB — BASIC METABOLIC PANEL
ANION GAP: 12 (ref 5–15)
BUN: 8 mg/dL (ref 6–20)
CO2: 22 mmol/L (ref 22–32)
Calcium: 8.9 mg/dL (ref 8.9–10.3)
Chloride: 99 mmol/L (ref 98–111)
Creatinine, Ser: 0.37 mg/dL — ABNORMAL LOW (ref 0.44–1.00)
GFR calc non Af Amer: >60 mL/min (ref >60)
Glucose, Bld: 362 mg/dL — ABNORMAL HIGH (ref 70–99)
Potassium: 3.8 mmol/L (ref 3.5–5.1)
SODIUM: 133 mmol/L — AB (ref 135–145)

## 2018-01-16 LAB — LACTIC ACID, PLASMA: Lactic Acid, Venous: 1.6 mmol/L (ref 0.5–1.9)

## 2018-01-16 MED ORDER — SODIUM CHLORIDE 0.9 % IV BOLUS
1000.0000 mL | Freq: Once | INTRAVENOUS | Status: AC
  Administered 2018-01-16: 1000 mL via INTRAVENOUS

## 2018-01-16 MED ORDER — LIDOCAINE HCL (PF) 2 % IJ SOLN
INTRAMUSCULAR | Status: AC
  Administered 2018-01-17: 10:00:00
  Filled 2018-01-16: qty 20

## 2018-01-16 MED ORDER — POVIDONE-IODINE 10 % EX SOLN
CUTANEOUS | Status: AC
  Administered 2018-01-17: 10:00:00
  Filled 2018-01-16: qty 15

## 2018-01-16 MED ORDER — CLINDAMYCIN PHOSPHATE 900 MG/50ML IV SOLN
900.0000 mg | Freq: Once | INTRAVENOUS | Status: AC
  Administered 2018-01-16: 900 mg via INTRAVENOUS
  Filled 2018-01-16: qty 50

## 2018-01-16 MED ORDER — INSULIN ASPART 100 UNIT/ML ~~LOC~~ SOLN
8.0000 [IU] | Freq: Once | SUBCUTANEOUS | Status: AC
  Administered 2018-01-16: 8 [IU] via SUBCUTANEOUS
  Filled 2018-01-16: qty 1

## 2018-01-16 NOTE — ED Triage Notes (Signed)
Pt C/O abscess to the left inner thigh and left breast. Pt states she first noticed it this morning. Pt denies fevers. Pt C/O nausea.

## 2018-01-17 ENCOUNTER — Encounter (HOSPITAL_COMMUNITY): Payer: Self-pay

## 2018-01-17 DIAGNOSIS — N611 Abscess of the breast and nipple: Secondary | ICD-10-CM

## 2018-01-17 DIAGNOSIS — L03116 Cellulitis of left lower limb: Secondary | ICD-10-CM | POA: Diagnosis not present

## 2018-01-17 DIAGNOSIS — R739 Hyperglycemia, unspecified: Secondary | ICD-10-CM | POA: Diagnosis not present

## 2018-01-17 DIAGNOSIS — L0291 Cutaneous abscess, unspecified: Secondary | ICD-10-CM | POA: Diagnosis not present

## 2018-01-17 DIAGNOSIS — L02416 Cutaneous abscess of left lower limb: Secondary | ICD-10-CM

## 2018-01-17 DIAGNOSIS — L0390 Cellulitis, unspecified: Secondary | ICD-10-CM | POA: Diagnosis present

## 2018-01-17 DIAGNOSIS — N61 Mastitis without abscess: Secondary | ICD-10-CM | POA: Diagnosis not present

## 2018-01-17 LAB — COMPREHENSIVE METABOLIC PANEL
ALT: 45 U/L — ABNORMAL HIGH (ref 0–44)
AST: 29 U/L (ref 15–41)
Albumin: 3.7 g/dL (ref 3.5–5.0)
Alkaline Phosphatase: 69 U/L (ref 38–126)
Anion gap: 8 (ref 5–15)
BUN: 8 mg/dL (ref 6–20)
CHLORIDE: 101 mmol/L (ref 98–111)
CO2: 27 mmol/L (ref 22–32)
Calcium: 8.7 mg/dL — ABNORMAL LOW (ref 8.9–10.3)
Creatinine, Ser: 0.48 mg/dL (ref 0.44–1.00)
GFR calc Af Amer: >60 mL/min (ref >60)
GFR calc non Af Amer: >60 mL/min (ref >60)
GLUCOSE: 397 mg/dL — AB (ref 70–99)
Potassium: 4.2 mmol/L (ref 3.5–5.1)
Sodium: 136 mmol/L (ref 135–145)
Total Bilirubin: 0.4 mg/dL (ref 0.3–1.2)
Total Protein: 7.3 g/dL (ref 6.5–8.1)

## 2018-01-17 LAB — CBC
HCT: 42.5 % (ref 36.0–46.0)
Hemoglobin: 13.1 g/dL (ref 12.0–15.0)
MCH: 26.4 pg (ref 26.0–34.0)
MCHC: 30.8 g/dL (ref 30.0–36.0)
MCV: 85.7 fL (ref 80.0–100.0)
PLATELETS: 188 10*3/uL (ref 150–400)
RBC: 4.96 MIL/uL (ref 3.87–5.11)
RDW: 14 % (ref 11.5–15.5)
WBC: 8.9 10*3/uL (ref 4.0–10.5)
nRBC: 0 % (ref 0.0–0.2)

## 2018-01-17 LAB — GLUCOSE, CAPILLARY
Glucose-Capillary: 269 mg/dL — ABNORMAL HIGH (ref 70–99)
Glucose-Capillary: 335 mg/dL — ABNORMAL HIGH (ref 70–99)
Glucose-Capillary: 424 mg/dL — ABNORMAL HIGH (ref 70–99)

## 2018-01-17 LAB — CBG MONITORING, ED
Glucose-Capillary: 303 mg/dL — ABNORMAL HIGH (ref 70–99)
Glucose-Capillary: 309 mg/dL — ABNORMAL HIGH (ref 70–99)

## 2018-01-17 MED ORDER — ENOXAPARIN SODIUM 60 MG/0.6ML ~~LOC~~ SOLN
50.0000 mg | SUBCUTANEOUS | Status: DC
  Filled 2018-01-17: qty 0.6

## 2018-01-17 MED ORDER — CLONAZEPAM 0.5 MG PO TABS
1.0000 mg | ORAL_TABLET | Freq: Three times a day (TID) | ORAL | Status: DC
  Administered 2018-01-17 – 2018-01-18 (×4): 1 mg via ORAL
  Filled 2018-01-17 (×4): qty 2

## 2018-01-17 MED ORDER — MORPHINE SULFATE (PF) 2 MG/ML IV SOLN
2.0000 mg | INTRAVENOUS | Status: DC | PRN
  Administered 2018-01-17 – 2018-01-18 (×4): 2 mg via INTRAVENOUS
  Filled 2018-01-17 (×5): qty 1

## 2018-01-17 MED ORDER — ONDANSETRON HCL 4 MG/2ML IJ SOLN: 4.0000 mg | Freq: Four times a day (QID) | INTRAMUSCULAR | Status: DC | PRN

## 2018-01-17 MED ORDER — INSULIN ASPART 100 UNIT/ML ~~LOC~~ SOLN
0.0000 [IU] | Freq: Every day | SUBCUTANEOUS | Status: DC
  Administered 2018-01-17: 5 [IU] via SUBCUTANEOUS

## 2018-01-17 MED ORDER — INSULIN ASPART 100 UNIT/ML ~~LOC~~ SOLN
10.0000 [IU] | Freq: Once | SUBCUTANEOUS | Status: AC
  Administered 2018-01-17: 10 [IU] via SUBCUTANEOUS

## 2018-01-17 MED ORDER — CLINDAMYCIN PHOSPHATE 600 MG/50ML IV SOLN
600.0000 mg | Freq: Three times a day (TID) | INTRAVENOUS | Status: DC
  Administered 2018-01-17 – 2018-01-18 (×4): 600 mg via INTRAVENOUS
  Filled 2018-01-17 (×4): qty 50

## 2018-01-17 MED ORDER — HYDROMORPHONE HCL 1 MG/ML IJ SOLN
0.5000 mg | Freq: Once | INTRAMUSCULAR | Status: AC
  Administered 2018-01-17: 0.5 mg via INTRAVENOUS
  Filled 2018-01-17: qty 0.5

## 2018-01-17 MED ORDER — MORPHINE SULFATE (PF) 4 MG/ML IV SOLN
4.0000 mg | Freq: Once | INTRAVENOUS | Status: AC
  Administered 2018-01-17: 4 mg via INTRAVENOUS
  Filled 2018-01-17: qty 1

## 2018-01-17 MED ORDER — ONDANSETRON HCL 4 MG/2ML IJ SOLN
4.0000 mg | Freq: Once | INTRAMUSCULAR | Status: AC
  Administered 2018-01-17: 4 mg via INTRAVENOUS
  Filled 2018-01-17: qty 2

## 2018-01-17 MED ORDER — ENALAPRIL MALEATE 5 MG PO TABS
5.0000 mg | ORAL_TABLET | Freq: Every evening | ORAL | Status: DC
  Administered 2018-01-17: 5 mg via ORAL
  Filled 2018-01-17 (×2): qty 1

## 2018-01-17 MED ORDER — ACETAMINOPHEN 325 MG PO TABS: 650.0000 mg | ORAL_TABLET | Freq: Four times a day (QID) | ORAL | Status: DC | PRN

## 2018-01-17 MED ORDER — ACETAMINOPHEN 650 MG RE SUPP: 650.0000 mg | Freq: Four times a day (QID) | RECTAL | Status: DC | PRN

## 2018-01-17 MED ORDER — ONDANSETRON HCL 4 MG PO TABS: 4.0000 mg | ORAL_TABLET | Freq: Four times a day (QID) | ORAL | Status: DC | PRN

## 2018-01-17 MED ORDER — ENOXAPARIN SODIUM 40 MG/0.4ML ~~LOC~~ SOLN: 40.0000 mg | SUBCUTANEOUS | Status: DC

## 2018-01-17 MED ORDER — GABAPENTIN 300 MG PO CAPS
600.0000 mg | ORAL_CAPSULE | Freq: Three times a day (TID) | ORAL | Status: DC
  Administered 2018-01-17 – 2018-01-18 (×4): 600 mg via ORAL
  Filled 2018-01-17 (×4): qty 2

## 2018-01-17 MED ORDER — INSULIN GLARGINE 100 UNIT/ML ~~LOC~~ SOLN
70.0000 [IU] | Freq: Every evening | SUBCUTANEOUS | Status: DC
  Administered 2018-01-17: 70 [IU] via SUBCUTANEOUS
  Filled 2018-01-17 (×2): qty 0.7

## 2018-01-17 MED ORDER — SODIUM CHLORIDE 0.9 % IV SOLN
INTRAVENOUS | Status: DC
  Administered 2018-01-17: 75 mL/h via INTRAVENOUS

## 2018-01-17 MED ORDER — INSULIN ASPART 100 UNIT/ML ~~LOC~~ SOLN
0.0000 [IU] | Freq: Three times a day (TID) | SUBCUTANEOUS | Status: DC
  Administered 2018-01-17: 11 [IU] via SUBCUTANEOUS
  Administered 2018-01-17: 8 [IU] via SUBCUTANEOUS
  Administered 2018-01-17: 11 [IU] via SUBCUTANEOUS
  Administered 2018-01-18: 8 [IU] via SUBCUTANEOUS
  Filled 2018-01-17: qty 1

## 2018-01-17 NOTE — H&P (Signed)
TRH H&P    Patient Demographics:    Heather [name], is a 23 y.o. female  MRN: [id number]  DOB - Jun 17, 1995  Admit Date - 01/16/2018  Referring MD/NP/PA: Nanda Quinton  Outpatient Primary MD for the patient is Medicine, [facility]  Patient coming from: Home  Chief complaint-abscess   HPI:    Heather [name]  is a 23 y.o. female, with history of type 2 diabetes mellitus, hidradenitis suppurativa came to hospital with 2 large abscesses and feeling unwell.  Patient had first abscess on the left lateral breast which started a small lesion became progressively larger.  And other one was in the left upper medial thigh which extremely painful with surrounding redness.  Patient was placed on Bactrim last week with worsening of symptoms.  Patient's last hemoglobin A1c was around 14 as per patient.  She has established care with a new endocrinologist in [place] to have better control of blood glucose. Abscesses were incised and drained in the ED and patient started on clindamycin. No history of chest pain or shortness of breath  Complains of fever and chills. No nausea vomiting or diarrhea.   Review of systems:    In addition to the HPI above,    All other systems reviewed and are negative.    Past History of the following :    Past Medical History:  Diagnosis Date  . ADHD (attention deficit hyperactivity disorder)   . Anemia   . Asthma    as a child  . Bipolar 1 disorder ([place])   . Diabetes mellitus    type 2  . Diabetic neuropathy ([place])   . Fatty liver   . GHTN vs. CHTN 06/11/2015  . Hidradenitis 06/05/2012  . History of hiatal hernia   . Irregular bleeding 06/05/2012  . Mononucleosis january 2014  . Nausea and vomiting 01/21/2015  . Obesity (BMI 30-39.9)   . Other and unspecified ovarian cyst 07/24/2012   Right  Ovarian cyst 8 cm on [place] 7/15 in ER  . Pneumonia   . Pregnant 01/21/2015  . Schizo  affective schizophrenia ([place])   . Seizures ([place])    febrile seizures as a child  . Sleep apnea    no cpap      Past Surgical History:  Procedure Laterality Date  . ADENOIDECTOMY    . ANTERIOR CRUCIATE LIGAMENT REPAIR    . 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 TONSILLECTOMY    . WISDOM TOOTH EXTRACTION        Social History:      Social History   Tobacco Use  . Smoking status: Never Smoker  . Smokeless tobacco: Never Used  Substance Use Topics  . Alcohol use: No       Family History :     Family History  Problem Relation Age of Onset  . Diabetes Mother   . Hypertension Mother   . Bipolar disorder Father   . Bipolar disorder  Brother   . Anxiety disorder Brother   . Asthma Sister        intrinsic  . Stroke Maternal Grandmother   . Cancer Other        colon, lung, brain-paternal side      Home Medications:   Prior to Admission medications   Medication Sig Start Date End Date Taking? Authorizing Provider  clonazePAM (KLONOPIN) 1 MG tablet Take 1 mg by mouth 3 (three) times daily. 04/19/17  Yes [provider]  enalapril (VASOTEC) 5 MG tablet Take 5 mg by mouth every evening.   Yes [provider]  gabapentin (NEURONTIN) 600 MG tablet Take 1 tablet (600 mg total) by mouth 3 (three) times daily. 11/29/17  Yes Elayne Snare, MD  insulin aspart (NOVOLOG) 100 UNIT/ML injection Inject 25-40 Units into the skin 3 (three) times daily before meals. Based on sliding scale starting at 25 units for levels between 150-200. Add 5 units as directed   Yes [provider]  metFORMIN (GLUCOPHAGE-XR) 500 MG 24 hr tablet Take 4 tablets (2,000 mg total) by mouth daily with supper. Patient taking differently: Take 1,000 mg by mouth 2 (two) times daily.  05/05/17  Yes Elayne Snare, MD  sulfamethoxazole-trimethoprim (BACTRIM DS,SEPTRA DS) 800-160 MG tablet Take 1 tablet by mouth 2 (two) times  daily. 14 day course prescribed on 01/13/2018 01/13/18 01/27/18 Yes [provider]  ACCU-CHEK FASTCLIX LANCETS MISC Used to check blood sugars up to four times daily. 07/20/17   Renato Shin, MD  Blood Glucose Monitoring Suppl (ACCU-CHEK GUIDE ME) w/Device KIT 1 each by Does not apply route daily. USE METER TO CHECK BLOOD SUGAR 4 TIMES DAILY. DX:E11.65 07/18/17   Elayne Snare, MD  glucose blood (ACCU-CHEK GUIDE) test strip Use as instructed to check blood sugar 4 times daily. DX:E11.65 07/18/17   Elayne Snare, MD  Insulin Degludec (TRESIBA FLEXTOUCH) 200 UNIT/ML SOPN Inject 70 Units into the skin daily. Patient taking differently: Inject 70 Units into the skin every evening.  05/05/17   Elayne Snare, MD  Insulin Pen Needle (PEN NEEDLES) 30G X 5 MM MISC 1 each by Does not apply route daily. USE 4 PEN NEEDLES DAILY FOR INSULIN ADMINISTRATION. 11/29/17   Elayne Snare, MD  insulin regular human CONCENTRATED (HUMULIN R U-500 KWIKPEN) 500 UNIT/ML kwikpen INJECT 70 UNITS 30 MINUTES BEFORE MEALS Patient not taking: Reported on 01/16/2018 11/29/17   Elayne Snare, MD  Insulin Syringe/Needle U-500 (BD INSULIN SYRINGE U-500) 31G X 6MM 0.5 ML MISC 1 each by Does not apply route daily. USE INSULIN SYRINGE TO INJECT INSULIN 3 TIMES DAILY. 11/29/17   Elayne Snare, MD     Allergies:     Allergies  Allergen Reactions  . Prozac [Fluoxetine Hcl]     Stiffness in neck, "stroke like" symptoms  . Cephalosporins Other (See Comments)    Unknown childhood reaction  . Latuda [Lurasidone Hcl]     Tongue, confusion      Physical Exam:   Vitals  Blood pressure 110/65, pulse 92, temperature 100 F (37.8 C), temperature source Oral, resp. rate 17, height 5' 5" (1.651 m), weight 107.5 kg, last menstrual period 01/09/2018, SpO2 95 %, unknown if currently breastfeeding.  1.  General: Appears in no acute distress  2. Psychiatric: Alert, oriented x3, intact judgment and insight.  3. Neurologic: Cranial nerves II  through XII grossly intact, no focal deficit noted.  Motor strength 5/5 in all extremities  4. HEENMT:  Oral mucosa is moist, PERRLA.  5. Respiratory : Clear to auscultation bilaterally.  Normal respiratory effort  6. Cardiovascular : S1-S2, regular, no murmurs auscultated.  7. Gastrointestinal:  Soft, nontender, no organomegaly.   8. Skin:  Left upper medial thigh has wound with packing in place.  Breast abscess not examined.      Data Review:    CBC Recent Labs  Lab 01/16/18 2207  WBC 10.3  HGB 13.7  HCT 44.0  PLT 197  MCV 83.3  MCH 25.9*  MCHC 31.1  RDW 13.8  LYMPHSABS 3.2  MONOABS 0.6  EOSABS 0.1  BASOSABS 0.0   ------------------------------------------------------------------------------------------------------------------  Results for orders placed or performed during the hospital encounter of 01/16/18 (from the past 48 hour(s))  Blood culture (routine x 2)     Status: None (Preliminary result)   Collection Time: 01/16/18 10:04 PM  Result Value Ref Range   Specimen Description BLOOD RIGHT ARM    Special Requests      BOTTLES DRAWN AEROBIC AND ANAEROBIC Blood Culture adequate volume Performed at [facility], [address], [place], [place] [address]    Culture PENDING    Report Status PENDING   CBC with Differential     Status: Abnormal   Collection Time: 01/16/18 10:07 PM  Result Value Ref Range   WBC 10.3 4.0 - 10.5 K/uL   RBC 5.28 (H) 3.87 - 5.11 MIL/uL   Hemoglobin 13.7 12.0 - 15.0 g/dL   HCT 44.0 36.0 - 46.0 %   MCV 83.3 80.0 - 100.0 fL   MCH 25.9 (L) 26.0 - 34.0 pg   MCHC 31.1 30.0 - 36.0 g/dL   RDW 13.8 11.5 - 15.5 %   Platelets 197 150 - 400 K/uL   nRBC 0.0 0.0 - 0.2 %   Neutrophils Relative % 62 %   Neutro Abs 6.3 1.7 - 7.7 K/uL   Lymphocytes Relative 31 %   Lymphs Abs 3.2 0.7 - 4.0 K/uL   Monocytes Relative 6 %   Monocytes Absolute 0.6 0.1 - 1.0 K/uL   Eosinophils Relative 1 %   Eosinophils Absolute 0.1 0.0 - 0.5 K/uL    Basophils Relative 0 %   Basophils Absolute 0.0 0.0 - 0.1 K/uL   Immature Granulocytes 0 %   Abs Immature Granulocytes 0.03 0.00 - 0.07 K/uL    Comment: Performed at [facility], [address], [place], [place] [address]  Basic metabolic panel     Status: Abnormal   Collection Time: 01/16/18 10:07 PM  Result Value Ref Range   Sodium 133 (L) 135 - 145 mmol/L   Potassium 3.8 3.5 - 5.1 mmol/L   Chloride 99 98 - 111 mmol/L   CO2 22 22 - 32 mmol/L   Glucose, Bld 362 (H) 70 - 99 mg/dL   BUN 8 6 - 20 mg/dL   Creatinine, Ser 0.37 (L) 0.44 - 1.00 mg/dL   Calcium 8.9 8.9 - 10.3 mg/dL   GFR calc non Af Amer >60 >60 mL/min   GFR calc Af Amer >60 >60 mL/min   Anion gap 12 5 - 15    Comment: Performed at [facility], [address], [place], [place] [address]  Blood culture (routine x 2)     Status: None (Preliminary result)   Collection Time: 01/16/18 10:07 PM  Result Value Ref Range   Specimen Description BLOOD RIGHT HAND    Special Requests      BOTTLES DRAWN AEROBIC AND ANAEROBIC Blood Culture adequate volume Performed at [facility], [address] [place], [place] [address]    Culture PENDING    Report Status PENDING   Lactic acid, plasma     Status: None   Collection Time: 01/16/18 10:07 PM  Result Value Ref Range   Lactic Acid, Venous 1.6 0.5 - 1.9 mmol/L    Comment: Performed at [facility], [address], [place], [place] [address]  POC CBG, ED     Status: Abnormal   Collection Time: 01/17/18  2:56 AM  Result Value Ref Range   Glucose-Capillary 303 (H) 70 - 99 mg/dL    Chemistries  Recent Labs  Lab 01/16/18 2207  NA 133*  K 3.8  CL 99  CO2 22  GLUCOSE 362*  BUN 8  CREATININE 0.37*  CALCIUM 8.9   ------------------------------------------------------------------------------------------------------------------  ------------------------------------------------------------------------------------------------------------------ GFR: Estimated Creatinine Clearance:  135.6 mL/min (A) (by C-G formula based on SCr of 0.37 mg/dL (L)). Liver Function Tests: No results for input(s): AST, ALT, ALKPHOS, BILITOT, PROT, ALBUMIN in the last 168 hours. No results for input(s): LIPASE, AMYLASE in the last 168 hours. No results for input(s): AMMONIA in the last 168 hours. Coagulation Profile: No results for input(s): INR, PROTIME in the last 168 hours. Cardiac Enzymes: No results for input(s): CKTOTAL, CKMB, CKMBINDEX, TROPONINI in the last 168 hours. BNP (last 3 results) No results for input(s): PROBNP in the last 8760 hours. HbA1C: No results for input(s): HGBA1C in the last 72 hours. CBG: Recent Labs  Lab 01/17/18 0256  GLUCAP 303*   Lipid Profile: No results for input(s): CHOL, HDL, LDLCALC, TRIG, CHOLHDL, LDLDIRECT in the last 72 hours. Thyroid Function Tests: No results for input(s): TSH, T4TOTAL, FREET4, T3FREE, THYROIDAB in the last 72 hours. Anemia Panel: No results for input(s): VITAMINB12, FOLATE, FERRITIN, TIBC, IRON, RETICCTPCT in the last 72 hours.  --------------------------------------------------------------------------------------------------------------- Urine analysis:    Component Value Date/Time   COLORURINE YELLOW 10/03/2017 1005   APPEARANCEUR Sl Cloudy (A) 10/03/2017 1005   APPEARANCEUR Clear 03/19/2015 1345   LABSPEC >=1.030 (A) 10/03/2017 1005   PHURINE 5.5 10/03/2017 1005   GLUCOSEU 250 (A) 10/03/2017 1005   HGBUR SMALL (A) 10/03/2017 1005   BILIRUBINUR NEGATIVE 10/03/2017 1005   BILIRUBINUR Negative 03/19/2015 1345   KETONESUR NEGATIVE 10/03/2017 1005   PROTEINUR neg 09/02/2015 1505   PROTEINUR NEGATIVE 07/24/2015 2248   UROBILINOGEN 0.2 10/03/2017 1005   NITRITE NEGATIVE 10/03/2017 1005   LEUKOCYTESUR TRACE (A) 10/03/2017 1005   LEUKOCYTESUR 1+ (A) 03/19/2015 1345      Imaging Results:    No results found.    Assessment & Plan:    Active Problems:   Cellulitis   1. Left thigh abscess-status post  incision and drainage in the ED.  Continue IV clindamycin 600 mg every 8 hours.  Will consult general surgery in a.m. for further exploration of the abscess.  2. Left breast abscess-status post incision and drainage in the ED.  Continue antibiotics as above.  3. Diabetes mellitus type 2-continue Tresiba 70 units subcu daily, start sliding scale insulin with NovoLog.  Check CBG q. before meals and at bedtime.   DVT Prophylaxis-   Lovenox   AM Labs Ordered, also please review Full Orders  Family Communication: Admission, patients condition and plan of care including tests being ordered have been discussed with the patient  who indicate understanding and agree with the plan and Code Status.  Code Status: Full code  Admission status: Observation: Based on patients clinical presentation and evaluation of above clinical data, I have made  determination that patient will need less than 2 midnight stay in the hospital.  Placed under observation for cellulitis/abscess of left thigh.  General surgery consulted.  Time spent in minutes : 60 minutes   Oswald Hillock M.D on 01/17/2018 at 4:33 AM  Between 7am to 7pm - Pager - [phone]. After 7pm go to [email or web address] - password [email or web address]   [facility] - Office  [phone]

## 2018-01-17 NOTE — Consult Note (Addendum)
Heather [name]  Reason for Consult: Left inner thigh, left lateral breast abscess  Referring Physician:  Dr. Dyann Kief   Chief Complaint    Abscess      Heather [name] is a 23 y.o. female.  HPI: Heather [name] is a 23 yo obese female with poorly controlled diabetes who comes in with an abscess of the left lateral breast and the left inner thigh region. She had been on oral antibiotic without improvement, and was seen in the ED with partial I&D of the abscess on the left breast and left inner thigh. She had some purulence removed and sent for culture. She continues to have pain and swelling in the region. Dr. Dyann Kief is concerned that it is not fully drained.  The patient has had abscesses in the past, and thinks she might of had MRSA in the past.  She has been having blood sugars > 300. She says the areas are still painful and swollen.   Past Medical History:  Diagnosis Date  . ADHD (attention deficit hyperactivity disorder)   . Anemia   . Asthma    as a child  . Bipolar 1 disorder ([place])   . Diabetes mellitus    type 2  . Diabetic neuropathy ([place])   . Fatty liver   . GHTN vs. CHTN 06/11/2015  . Hidradenitis 06/05/2012  . History of hiatal hernia   . Irregular bleeding 06/05/2012  . Mononucleosis january 2014  . Nausea and vomiting 01/21/2015  . Obesity (BMI 30-39.9)   . Other and unspecified ovarian cyst 07/24/2012   Right  Ovarian cyst 8 cm on [place] 7/15 in ER  . Pneumonia   . Pregnant 01/21/2015  . Schizo affective schizophrenia ([place])   . Seizures ([place])    febrile seizures as a child  . Sleep apnea    no cpap    Past Surgical History:  Procedure Laterality Date  . ADENOIDECTOMY    . ANTERIOR CRUCIATE LIGAMENT REPAIR    . 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 TONSILLECTOMY    . WISDOM TOOTH EXTRACTION      Family History  Problem Relation Age of Onset  . Diabetes Mother   .  Hypertension Mother   . Bipolar disorder Father   . Bipolar disorder Brother   . Anxiety disorder Brother   . Asthma Sister        intrinsic  . Stroke Maternal Grandmother   . Cancer Other        colon, lung, brain-paternal side    Social History   Tobacco Use  . Smoking status: Never Smoker  . Smokeless tobacco: Never Used  Substance Use Topics  . Alcohol use: No  . Drug use: No    Medications:  I have reviewed the patient's current medications. Prior to Admission:  Medications Prior to Admission  Medication Sig Dispense Refill Last Dose  . clonazePAM (KLONOPIN) 1 MG tablet Take 1 mg by mouth 3 (three) times daily.  2 01/16/2018 at Unknown time  . enalapril (VASOTEC) 5 MG tablet Take 5 mg by mouth every evening.   01/15/2018 at Unknown time  . gabapentin (NEURONTIN) 600 MG tablet Take 1 tablet (600 mg total) by mouth 3 (three) times daily. 90 tablet 0 01/16/2018 at Unknown time  . insulin aspart (NOVOLOG) 100 UNIT/ML injection Inject 25-40 Units into the skin 3 (three) times daily before  meals. Based on sliding scale starting at 25 units for levels between 150-200. Add 5 units as directed   01/16/2018 at Unknown time  . metFORMIN (GLUCOPHAGE-XR) 500 MG 24 hr tablet Take 4 tablets (2,000 mg total) by mouth daily with supper. (Patient taking differently: Take 1,000 mg by mouth 2 (two) times daily. ) 120 tablet 3 01/16/2018 at Unknown time  . sulfamethoxazole-trimethoprim (BACTRIM DS,SEPTRA DS) 800-160 MG tablet Take 1 tablet by mouth 2 (two) times daily. 14 day course prescribed on 01/13/2018     . ACCU-CHEK FASTCLIX LANCETS MISC Used to check blood sugars up to four times daily. 102 each 2 Taking  . Blood Glucose Monitoring Suppl (ACCU-CHEK GUIDE ME) w/Device KIT 1 each by Does not apply route daily. USE METER TO CHECK BLOOD SUGAR 4 TIMES DAILY. DX:E11.65 1 kit 0 Taking  . glucose blood (ACCU-CHEK GUIDE) test strip Use as instructed to check blood sugar 4 times daily. DX:E11.65 200  each 3 Taking  . Insulin Degludec (TRESIBA FLEXTOUCH) 200 UNIT/ML SOPN Inject 70 Units into the skin daily. (Patient taking differently: Inject 70 Units into the skin every evening. ) 6 pen 3 Taking  . Insulin Pen Needle (PEN NEEDLES) 30G X 5 MM MISC 1 each by Does not apply route daily. USE 4 PEN NEEDLES DAILY FOR INSULIN ADMINISTRATION. 200 each 0   . insulin regular human CONCENTRATED (HUMULIN R U-500 KWIKPEN) 500 UNIT/ML kwikpen INJECT 70 UNITS 30 MINUTES BEFORE MEALS (Patient not taking: Reported on 01/16/2018) 6 mL 0 Not Taking at Unknown time  . Insulin Syringe/Needle U-500 (BD INSULIN SYRINGE U-500) 31G X 6MM 0.5 ML MISC 1 each by Does not apply route daily. USE INSULIN SYRINGE TO INJECT INSULIN 3 TIMES DAILY. 100 each 0    Scheduled: . clonazePAM  1 mg Oral TID  . enalapril  5 mg Oral QPM  . [START ON 01/18/2018] enoxaparin (LOVENOX) injection  50 mg Subcutaneous Q24H  . gabapentin  600 mg Oral TID  .  HYDROmorphone (DILAUDID) injection  0.5 mg Intravenous Once  . insulin aspart  0-15 Units Subcutaneous TID WC  . insulin aspart  0-5 Units Subcutaneous QHS  . insulin glargine  70 Units Subcutaneous QPM   Continuous: . sodium chloride 75 mL/hr at 01/17/18 0951  . clindamycin (CLEOCIN) IV 600 mg (01/17/18 1430)   JWJ:[id number] **OR** acetaminophen, morphine injection, ondansetron **OR** ondansetron (ZOFRAN) IV  Allergies  Allergen Reactions  . Prozac [Fluoxetine Hcl]     Stiffness in neck, "stroke like" symptoms  . Cephalosporins Other (See Comments)    Unknown childhood reaction  . Latuda [Lurasidone Hcl]     Tongue, confusion     ROS:  A comprehensive review of systems was negative except for: Integument/breast: positive for left lateral breast swelling /redness Musculoskeletal: positive for left groin/ inner thigh swelling/ pain Endocrine: positive for poorly controlled diabetes  Blood pressure (!) 117/56, pulse 87, temperature 98.4 F (36.9 C), temperature source  Oral, resp. rate 18, height 5' 5"  (1.651 m), weight 107.5 kg, last menstrual period 01/09/2018, SpO2 96 %, unknown if currently breastfeeding. Physical Exam Vitals signs reviewed.  Constitutional:      Appearance: Normal appearance.  HENT:     Head: Normocephalic.     Nose: Nose normal.     Mouth/Throat:     Mouth: Mucous membranes are moist.  Eyes:     Pupils: Pupils are equal, round, and reactive to light.  Neck:     Musculoskeletal: Normal range of  motion.  Cardiovascular:     Rate and Rhythm: Normal rate.  Chest:     Comments: Left lateral breast with erythema, area of fluctuance under the prior aspiration site Abdominal:     General: There is no distension.     Palpations: Abdomen is soft.     Tenderness: There is no abdominal tenderness.     Comments: Small red area on right lower abdomen, no drainage  Musculoskeletal: Normal range of motion.     Comments: Left groin/ inner thigh- erythema with swelling and induration/ possible fluctuance, small sealed up incision site, no drainage   Skin:    General: Skin is warm and dry.  Neurological:     General: No focal deficit present.     Mental Status: She is alert and oriented to person, place, and time.  Psychiatric:        Mood and Affect: Mood normal.        Behavior: Behavior normal.        Thought Content: Thought content normal.        Judgment: Judgment normal.    Results: Results for orders placed or performed during the hospital encounter of 01/16/18 (from the past 48 hour(s))  Blood culture (routine x 2)     Status: None (Preliminary result)   Collection Time: 01/16/18 10:04 PM  Result Value Ref Range   Specimen Description BLOOD RIGHT ARM    Special Requests      BOTTLES DRAWN AEROBIC AND ANAEROBIC Blood Culture adequate volume   Culture      NO GROWTH < 24 HOURS Performed at [facility], [address], [place], [place] [address]    Report Status PENDING   CBC with Differential     Status: Abnormal    Collection Time: 01/16/18 10:07 PM  Result Value Ref Range   WBC 10.3 4.0 - 10.5 K/uL   RBC 5.28 (H) 3.87 - 5.11 MIL/uL   Hemoglobin 13.7 12.0 - 15.0 g/dL   HCT 44.0 36.0 - 46.0 %   MCV 83.3 80.0 - 100.0 fL   MCH 25.9 (L) 26.0 - 34.0 pg   MCHC 31.1 30.0 - 36.0 g/dL   RDW 13.8 11.5 - 15.5 %   Platelets 197 150 - 400 K/uL   nRBC 0.0 0.0 - 0.2 %   Neutrophils Relative % 62 %   Neutro Abs 6.3 1.7 - 7.7 K/uL   Lymphocytes Relative 31 %   Lymphs Abs 3.2 0.7 - 4.0 K/uL   Monocytes Relative 6 %   Monocytes Absolute 0.6 0.1 - 1.0 K/uL   Eosinophils Relative 1 %   Eosinophils Absolute 0.1 0.0 - 0.5 K/uL   Basophils Relative 0 %   Basophils Absolute 0.0 0.0 - 0.1 K/uL   Immature Granulocytes 0 %   Abs Immature Granulocytes 0.03 0.00 - 0.07 K/uL    Comment: Performed at [facility], [address], [place], [place] [address]  Basic metabolic panel     Status: Abnormal   Collection Time: 01/16/18 10:07 PM  Result Value Ref Range   Sodium 133 (L) 135 - 145 mmol/L   Potassium 3.8 3.5 - 5.1 mmol/L   Chloride 99 98 - 111 mmol/L   CO2 22 22 - 32 mmol/L   Glucose, Bld 362 (H) 70 - 99 mg/dL   BUN 8 6 - 20 mg/dL   Creatinine, Ser 0.37 (L) 0.44 - 1.00 mg/dL   Calcium 8.9 8.9 - 10.3 mg/dL   GFR calc non  Af Amer >60 >60 mL/min   GFR calc Af Amer >60 >60 mL/min   Anion gap 12 5 - 15    Comment: Performed at [facility], [address] [place], [place] [address]  Blood culture (routine x 2)     Status: None (Preliminary result)   Collection Time: 01/16/18 10:07 PM  Result Value Ref Range   Specimen Description BLOOD RIGHT HAND    Special Requests      BOTTLES DRAWN AEROBIC AND ANAEROBIC Blood Culture adequate volume   Culture      NO GROWTH < 24 HOURS Performed at [facility], [address] [place], [place] [address]    Report Status PENDING   Lactic acid, plasma     Status: None   Collection Time: 01/16/18 10:07 PM  Result Value Ref Range   Lactic Acid, Venous 1.6 0.5 - 1.9 mmol/L     Comment: Performed at [facility], [address], [place], [place] [address]  POC CBG, ED     Status: Abnormal   Collection Time: 01/17/18  2:56 AM  Result Value Ref Range   Glucose-Capillary 303 (H) 70 - 99 mg/dL  CBC     Status: None   Collection Time: 01/17/18  6:14 AM  Result Value Ref Range   WBC 8.9 4.0 - 10.5 K/uL   RBC 4.96 3.87 - 5.11 MIL/uL   Hemoglobin 13.1 12.0 - 15.0 g/dL   HCT 42.5 36.0 - 46.0 %   MCV 85.7 80.0 - 100.0 fL   MCH 26.4 26.0 - 34.0 pg   MCHC 30.8 30.0 - 36.0 g/dL   RDW 14.0 11.5 - 15.5 %   Platelets 188 150 - 400 K/uL   nRBC 0.0 0.0 - 0.2 %    Comment: Performed at [facility], [address] [place],  [address]  Comprehensive metabolic panel     Status: Abnormal   Collection Time: 01/17/18  6:14 AM  Result Value Ref Range   Sodium 136 135 - 145 mmol/L   Potassium 4.2 3.5 - 5.1 mmol/L   Chloride 101 98 - 111 mmol/L   CO2 27 22 - 32 mmol/L   Glucose, Bld 397 (H) 70 - 99 mg/dL   BUN 8 6 - 20 mg/dL   Creatinine, Ser 0.48 0.44 - 1.00 mg/dL   Calcium 8.7 (L) 8.9 - 10.3 mg/dL   Total Protein 7.3 6.5 - 8.1 g/dL   Albumin 3.7 3.5 - 5.0 g/dL   AST 29 15 - 41 U/L   ALT 45 (H) 0 - 44 U/L   Alkaline Phosphatase 69 38 - 126 U/L   Total Bilirubin 0.4 0.3 - 1.2 mg/dL   GFR calc non Af Amer >60 >60 mL/min   GFR calc Af Amer >60 >60 mL/min   Anion gap 8 5 - 15    Comment: Performed at [facility], [address] [place],  [address]  CBG monitoring, ED     Status: Abnormal   Collection Time: 01/17/18  8:13 AM  Result Value Ref Range   Glucose-Capillary 309 (H) 70 - 99 mg/dL  Glucose, capillary     Status: Abnormal   Collection Time: 01/17/18 11:57 AM  Result Value Ref Range   Glucose-Capillary 269 (H) 70 - 99 mg/dL   Pre procedure diagnosis: Left breast abscess, left inner thigh abscess Post procedure diagnosis: Same  Procedure: Incision and Drainage of Abscess Left Breast and Left inner thigh   Description: The patient was noted to  have possible fluctuant areas under the prior I&D sites. This needs to be opened up more and we discussed the risk and benefits of this including bleeding, risk of no drainage, and we discussed that lidocaine does not work in infected fields. The patient opted to forego lidocaine and we gave IV morphine 32m once.   The area of the left breast and left inner thigh was prepped with chloraprep. An 11 blade was used to incision the area over the prior incision sites.  Blood was evacuated from both areas. No further purulence was evacuated.   Iodoform gauze was placed in the inner thigh incision.  Drain gauze was placed over the wounds.  Assessment & Plan:  Maire HNouriis a 23y.o. female with left lateral breast abscess and left inner thigh /groin abscess s/p I&D in the ED with controlled erythema, pain and swelling.  A bedside I&D was performed to open up the cavities more. No further purulence was expressed.   -Continue IV antibiotics, remaining infection is indurated and no more purulence was evacuated -Will see patient tomorrow -BS control  -Dry gauze as needed over the areas, no need to replace the packing   All questions were answered to the satisfaction of the patient and family.  LVirl Cagey1/14/2020, 2:49 PM

## 2018-01-17 NOTE — ED Provider Notes (Signed)
[facility] EMERGENCY DEPARTMENT Provider Note   CSN: [id number] Arrival date & time: 01/16/18  1855     History   Chief Complaint Chief Complaint  Patient presents with  . Abscess    HPI Heather [name] is a 23 y.o. female with a history most significant for not well controlled type 2 diabetes, hidradenitis suppurativa with frequent episodes of skin abscesses, but becoming more frequently recently presenting with 2 very large abscesses and skin infection along with feeling unwell, describing nausea, fatigue and intermittent chills with subjective fever.  The first is on her left lateral breast which started as a small lesion that was at first treated as ringworm but over the past week has become progressively larger and now has a large tender area of erythema covering the entirety of her left lateral breast.  2 days ago she developed an abscess in her left upper medial thigh which is extremely painful with surrounding redness.  There is been no drainage from either site despite warm compresses.  She was placed on Bactrim last week with worsening symptoms. She has just establish care with a new endocrinologist in [place] and has had changes in her medications to try and get her blood sugars under better control.  She is still on metformin, insulin aspart has been started along with [place].  She no longer takes Humalog.  The history is provided by the patient.    Past Medical History:  Diagnosis Date  . ADHD (attention deficit hyperactivity disorder)   . Anemia   . Asthma    as a child  . Bipolar 1 disorder ([place])   . Diabetes mellitus    type 2  . Diabetic neuropathy ([place])   . Fatty liver   . GHTN vs. CHTN 06/11/2015  . Hidradenitis 06/05/2012  . History of hiatal hernia   . Irregular bleeding 06/05/2012  . Mononucleosis january 2014  . Nausea and vomiting 01/21/2015  . Obesity (BMI 30-39.9)   . Other and unspecified ovarian cyst 07/24/2012   Right  Ovarian cyst 8 cm on [place] 7/15 in  ER  . Pneumonia   . Pregnant 01/21/2015  . Schizo affective schizophrenia ([place])   . Seizures ([place])    febrile seizures as a child  . Sleep apnea    no cpap    Patient Active Problem List   Diagnosis Date Noted  . Uncontrolled type 1 diabetes mellitus with hyperglycemia ([place]) 11/03/2016  . [redacted] weeks gestation of pregnancy 10/14/2016  . Pregnancy test positive 10/14/2016  . Diabetes mellitus affecting pregnancy in first trimester 10/14/2016  . Essential hypertension, benign 10/12/2016  . Class 2 severe obesity due to excess calories with serious comorbidity and body mass index (BMI) of 35.0 to 35.9 in adult ([place]) 10/04/2016  . Personal history of noncompliance with medical treatment, presenting hazards to health 10/04/2016  . Low back pain 03/25/2016  . History of gestational hypertension 10/20/2015  . History of preterm delivery, currently pregnant 10/20/2015  . Depression 07/16/2015  . Boil 07/10/2015  . Elevated LFTs 07/10/2015  . Antepartum diabetes mellitus 03/19/2015  . Encounter to determine fetal viability of pregnancy 10/23/2013  . Deficiency anemia 07/15/2013  . Microalbuminuric diabetic nephropathy ([place]) 04/17/2013  . Hidradenitis 06/05/2012  . Bipolar disorder, unspecified ([place]) 03/21/2012  . Diabetes mellitus, type 2 ([place]) 03/21/2012  . Asthma, chronic 03/21/2012  . Hypercholesteremia 01/04/2012  . Bulimia nervosa, purging type 11/05/2010  . Moderate mood disorder ([place]) 11/05/2010    Past Surgical History:  Procedure Laterality Date  . ADENOIDECTOMY    . ANTERIOR CRUCIATE LIGAMENT REPAIR    . 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 TONSILLECTOMY    . WISDOM TOOTH EXTRACTION       OB History    Gravida  2   Para  1   Term      Preterm  1   AB      Living  1     SAB      TAB      Ectopic      Multiple  0   Live Births  1            Home Medications    Prior to  Admission medications   Medication Sig Start Date End Date Taking? Authorizing Provider  clonazePAM (KLONOPIN) 1 MG tablet Take 1 mg by mouth 3 (three) times daily. 04/19/17  Yes [provider]  enalapril (VASOTEC) 5 MG tablet Take 5 mg by mouth every evening.   Yes [provider]  gabapentin (NEURONTIN) 600 MG tablet Take 1 tablet (600 mg total) by mouth 3 (three) times daily. 11/29/17  Yes Elayne Snare, MD  insulin aspart (NOVOLOG) 100 UNIT/ML injection Inject 25-40 Units into the skin 3 (three) times daily before meals. Based on sliding scale starting at 25 units for levels between 150-200. Add 5 units as directed   Yes [provider]  metFORMIN (GLUCOPHAGE-XR) 500 MG 24 hr tablet Take 4 tablets (2,000 mg total) by mouth daily with supper. Patient taking differently: Take 1,000 mg by mouth 2 (two) times daily.  05/05/17  Yes Elayne Snare, MD  sulfamethoxazole-trimethoprim (BACTRIM DS,SEPTRA DS) 800-160 MG tablet Take 1 tablet by mouth 2 (two) times daily. 14 day course prescribed on 01/13/2018 01/13/18 01/27/18 Yes [provider]  ACCU-CHEK FASTCLIX LANCETS MISC Used to check blood sugars up to four times daily. 07/20/17   Renato Shin, MD  Blood Glucose Monitoring Suppl (ACCU-CHEK GUIDE ME) w/Device KIT 1 each by Does not apply route daily. USE METER TO CHECK BLOOD SUGAR 4 TIMES DAILY. DX:E11.65 07/18/17   Elayne Snare, MD  glucose blood (ACCU-CHEK GUIDE) test strip Use as instructed to check blood sugar 4 times daily. DX:E11.65 07/18/17   Elayne Snare, MD  Insulin Degludec (TRESIBA FLEXTOUCH) 200 UNIT/ML SOPN Inject 70 Units into the skin daily. Patient taking differently: Inject 70 Units into the skin every evening.  05/05/17   Elayne Snare, MD  Insulin Pen Needle (PEN NEEDLES) 30G X 5 MM MISC 1 each by Does not apply route daily. USE 4 PEN NEEDLES DAILY FOR INSULIN ADMINISTRATION. 11/29/17   Elayne Snare, MD  insulin regular human CONCENTRATED (HUMULIN R U-500  KWIKPEN) 500 UNIT/ML kwikpen INJECT 70 UNITS 30 MINUTES BEFORE MEALS Patient not taking: Reported on 01/16/2018 11/29/17   Elayne Snare, MD  Insulin Syringe/Needle U-500 (BD INSULIN SYRINGE U-500) 31G X 6MM 0.5 ML MISC 1 each by Does not apply route daily. USE INSULIN SYRINGE TO INJECT INSULIN 3 TIMES DAILY. 11/29/17   Elayne Snare, MD    Family History Family History  Problem Relation Age of Onset  . Diabetes Mother   . Hypertension Mother   . Bipolar disorder Father   . Bipolar disorder Brother   . Anxiety disorder Brother   . Asthma Sister        intrinsic  . Stroke Maternal Grandmother   .  Cancer Other        colon, lung, brain-paternal side    Social History Social History   Tobacco Use  . Smoking status: Never Smoker  . Smokeless tobacco: Never Used  Substance Use Topics  . Alcohol use: No  . Drug use: No     Allergies   Prozac [fluoxetine hcl]; Cephalosporins; and Latuda [lurasidone hcl]   Review of Systems Review of Systems  Constitutional: Positive for chills, fatigue and fever.  HENT: Negative for congestion and sore throat.   Eyes: Negative.   Respiratory: Negative for chest tightness and shortness of breath.   Cardiovascular: Negative for chest pain.  Gastrointestinal: Positive for nausea. Negative for abdominal pain.  Genitourinary: Negative.   Musculoskeletal: Negative for arthralgias, joint swelling and neck pain.  Skin: Positive for color change and wound. Negative for rash.  Neurological: Negative for dizziness, weakness, light-headedness, numbness and headaches.  Psychiatric/Behavioral: Negative.      Physical Exam Updated Vital Signs BP (!) 138/47 (BP Location: Right Arm)   Pulse (!) 109   Temp 100 F (37.8 C) (Oral)   Resp 18   Ht 5' 5" (1.651 m)   Wt 107.5 kg   LMP 01/09/2018   SpO2 95%   BMI 39.44 kg/m   Physical Exam Vitals signs and nursing note reviewed.  Constitutional:      Appearance: She is well-developed.  HENT:      Head: Normocephalic and atraumatic.  Eyes:     Conjunctiva/sclera: Conjunctivae normal.  Neck:     Musculoskeletal: Normal range of motion.  Cardiovascular:     Rate and Rhythm: Regular rhythm. Tachycardia present.     Heart sounds: Normal heart sounds.  Pulmonary:     Effort: Pulmonary effort is normal.     Breath sounds: Normal breath sounds. No wheezing.  Abdominal:     General: Bowel sounds are normal.     Palpations: Abdomen is soft.     Tenderness: There is no abdominal tenderness.  Musculoskeletal: Normal range of motion.  Lymphadenopathy:     Upper Body:     Left upper body: No supraclavicular, axillary or pectoral adenopathy.  Skin:    General: Skin is warm and dry.     Findings: Abscess and erythema present.     Comments: Patient has skin scarring in her bilateral groin consistent with hidradenitis of her.  She has a large raised fluctuant abscess measuring 4 cm in greatest diameter in her left upper medial thigh.  There is surrounding erythema.  She has a macular yet slightly fluctuant quarter size lesion left lateral breast, there is mild scaling at the center of this lesion.  There is a large area of cellulitis surrounding this lesion.  Neurological:     Mental Status: She is alert.      ED Treatments / Results  Labs (all labs ordered are listed, but only abnormal results are displayed) Labs Reviewed  CBC WITH DIFFERENTIAL/PLATELET - Abnormal; Notable for the following components:      Result Value   RBC 5.28 (*)    MCH 25.9 (*)    All other components within normal limits  BASIC METABOLIC PANEL - Abnormal; Notable for the following components:   Sodium 133 (*)    Glucose, Bld 362 (*)    Creatinine, Ser 0.37 (*)    All other components within normal limits  CULTURE, BLOOD (ROUTINE X 2)  CULTURE, BLOOD (ROUTINE X 2)  AEROBIC CULTURE (SUPERFICIAL SPECIMEN)  LACTIC ACID, PLASMA  CBG MONITORING, ED    EKG None  Radiology No results  found.  Procedures Procedures (including critical care time)   INCISION AND DRAINAGE Left thigh Performed by: Evalee Jefferson Consent: Verbal consent obtained. Risks and benefits: risks, benefits and alternatives were discussed Type: abscess  Body area: left medial upper thigh  Anesthesia: local infiltration  Incision was made with a scalpel.  Local anesthetic: lidocaine 2% without epinephrine  Anesthetic total: 2 ml  Complexity: complex Blunt dissection to break up loculations  Drainage: purulent  Drainage amount: copious. This abscess site has started draining by the time of the I&D. Incision was made for better drainage.  Packing material: none Patient tolerance: Patient tolerated the procedure well with no immediate complications.  INCISION AND DRAINAGE Left breast Performed by: Evalee Jefferson Consent: Verbal consent obtained. Risks and benefits: risks, benefits and alternatives were discussed Type: abscess  Body area: left breast  Anesthesia: local infiltration  Incision was made with a scalpel.  Local anesthetic: lidocaine 2% without epinephrine  Anesthetic total: 2 ml  Complexity: complex Blunt dissection to break up loculations  Drainage: purulent  Drainage amount: moderate  Packing material: none  Patient tolerance: Patient tolerated the procedure well with no immediate complications.        Medications Ordered in ED Medications  lidocaine (XYLOCAINE) 2 % injection (has no administration in time range)  povidone-iodine (BETADINE) 10 % external solution (has no administration in time range)  clindamycin (CLEOCIN) IVPB 900 mg (0 mg Intravenous Stopped 01/16/18 2237)  sodium chloride 0.9 % bolus 1,000 mL (0 mLs Intravenous Stopped 01/17/18 0110)  insulin aspart (novoLOG) injection 8 Units (8 Units Subcutaneous Given 01/16/18 2342)  morphine 4 MG/ML injection 4 mg (4 mg Intravenous Given 01/17/18 0029)  ondansetron (ZOFRAN) injection 4 mg (4 mg  Intravenous Given 01/17/18 0029)     Initial Impression / Assessment and Plan / ED Course  I have reviewed the triage vital signs and the nursing notes.  Pertinent labs & imaging results that were available during my care of the patient were reviewed by me and considered in my medical decision making (see chart for details).     Patient with multiple skin abscesses with surrounding cellulitis, uncontrolled diabetes with a CBG tonight of 362.  Subjective fevers.  These abscesses were I&D but I suspect that there is a deeper infection, especially of the left thigh.  Patient would benefit from general surgery consult for consideration of a deeper washout.  She was given IV fluids, subcutaneous insulin and an IV dose of clindamycin.  Wound culture was also obtained of the left breast.  Patient was also seen by Dr. Laverta Baltimore during this ED evaluation and agrees with need for admission. Call placed to hospitalist group.  Final Clinical Impressions(s) / ED Diagnoses   Final diagnoses:  Abscess  Cellulitis of left lower extremity  Cellulitis of left breast  Hyperglycemia    ED Discharge Orders    None       Landis Martins 01/17/18 0118    Margette Fast, MD 01/17/18 1008

## 2018-01-17 NOTE — Progress Notes (Signed)
Patient seen and examined.  Admitted after midnight secondary to left thigh abscess and left breast abscess.  Patient symptoms have been present for the last 5-7 days now with over 3 days of oral antibiotics initiated as an outpatient with failure to improve symptoms.  In the ED I&D has been done and patient has been placed on IV clindamycin.  Patient is experiencing low-grade temperature, WBCs within normal limits.  On physical exam incised area continue to appears red and with significant induration.  There is no appreciated ongoing drainage at this time out of her incised abscesses. General surgery has been consulted and they will se patient to further evaluate and treat patient (in case further debridement needed).  Refer to H&P written by Dr. Sharl Ma on 01/17/2018 for further info/details on admission.  Plan: -start warm compresses  -continue IV antibiotics -control CBG's  -follow general surgery recommendations -continue as needed analgesics    Vassie Loll MD [phone]

## 2018-01-17 NOTE — Progress Notes (Signed)
K Schorr notified via Amion page system pt fsbs [id number]. New orders obtained. Will continue to monitor.

## 2018-01-18 DIAGNOSIS — L0390 Cellulitis, unspecified: Secondary | ICD-10-CM

## 2018-01-18 DIAGNOSIS — L02416 Cutaneous abscess of left lower limb: Secondary | ICD-10-CM

## 2018-01-18 LAB — GLUCOSE, CAPILLARY
GLUCOSE-CAPILLARY: 373 mg/dL — AB (ref 70–99)
Glucose-Capillary: 263 mg/dL — ABNORMAL HIGH (ref 70–99)
Glucose-Capillary: 420 mg/dL — ABNORMAL HIGH (ref 70–99)

## 2018-01-18 LAB — HIV ANTIBODY (ROUTINE TESTING W REFLEX): HIV Screen 4th Generation wRfx: NONREACTIVE

## 2018-01-18 MED ORDER — DOXYCYCLINE HYCLATE 100 MG PO CAPS: 100.0000 mg | ORAL_CAPSULE | Freq: Two times a day (BID) | ORAL | 0 refills | Status: AC

## 2018-01-18 MED ORDER — INSULIN ASPART 100 UNIT/ML ~~LOC~~ SOLN
3.0000 [IU] | Freq: Three times a day (TID) | SUBCUTANEOUS | Status: DC
  Administered 2018-01-18: 3 [IU] via SUBCUTANEOUS

## 2018-01-18 MED ORDER — TRAMADOL HCL 50 MG PO TABS: 50.0000 mg | ORAL_TABLET | Freq: Four times a day (QID) | ORAL | 0 refills | Status: AC | PRN

## 2018-01-18 MED ORDER — INSULIN ASPART 100 UNIT/ML ~~LOC~~ SOLN
0.0000 [IU] | Freq: Three times a day (TID) | SUBCUTANEOUS | Status: DC
  Administered 2018-01-18: 20 [IU] via SUBCUTANEOUS

## 2018-01-18 NOTE — Discharge Summary (Signed)
Physician Discharge Summary  Eusebio Friendly IOE:[id number] DOB: 12/21/1995 DOA: 01/16/2018  PCP: Medicine, [facility]  Admit date: 01/16/2018  Discharge date: 01/18/2018  Admitted From:Home  Disposition:  Home  Recommendations for Outpatient Follow-up:  1. Follow up with PCP in 1-2 weeks 2. Continue on doxycycline as prescribed for 12 more days for a total 14 day course  Home Health:None  Equipment/Devices:None  Discharge Condition:Stable  CODE STATUS: Full  Diet recommendation: Heart Healthy/Carb Modified  Brief/Interim Summary: Per HPI:  Heather [name]  is a 23 y.o. female, with history of type 2 diabetes mellitus, hidradenitis suppurativa came to hospital with 2 large abscesses and feeling unwell.  Patient had first abscess on the left lateral breast which started a small lesion became progressively larger.  And other one was in the left upper medial thigh which extremely painful with surrounding redness.  Patient was placed on Bactrim last week with worsening of symptoms.  Patient's last hemoglobin A1c was around 14 as per patient.  She has established care with a new endocrinologist in [place] to have better control of blood glucose. Abscesses were incised and drained in the ED and patient started on clindamycin. No history of chest pain or shortness of breath  Complains of fever and chills. No nausea vomiting or diarrhea.  Patient was treated on IV clindamycin with improvement in her symptoms and seen by general surgery with further I&D that was performed on 1/14 with no purulent fluid that was expressed.  She appears to be improved today with minimal pain and will follow up with her PCP in the next 14 days to ensure that she is improved.  She will remain on doxycycline twice daily for 12 more days to finish a 14-day course of treatment.  She has outpatient follow-up scheduled with endocrinology will work with her blood glucose management.  Discharge Diagnoses:  Active  Problems:   Cellulitis   Left breast abscess   Abscess of left thigh  Principle Discharge Diagnosis: Left lateral breast and left inner thigh/groin abscess with cellulitis s/p I/D.  Discharge Instructions  Discharge Instructions    Diet - low sodium heart healthy   Complete by:  As directed    Increase activity slowly   Complete by:  As directed      Allergies as of 01/18/2018      Reactions   Prozac [fluoxetine Hcl]    Stiffness in neck, "stroke like" symptoms   Cephalosporins Other (See Comments)   Unknown childhood reaction   Latuda [lurasidone Hcl]    Tongue, confusion       Medication List    STOP taking these medications   sulfamethoxazole-trimethoprim 800-160 MG tablet Commonly known as:  BACTRIM DS,SEPTRA DS     TAKE these medications   ACCU-CHEK FASTCLIX LANCETS Misc Used to check blood sugars up to four times daily.   ACCU-CHEK GUIDE ME w/Device Kit 1 each by Does not apply route daily. USE METER TO CHECK BLOOD SUGAR 4 TIMES DAILY. DX:E11.65   clonazePAM 1 MG tablet Commonly known as:  KLONOPIN Take 1 mg by mouth 3 (three) times daily.   doxycycline 100 MG capsule Commonly known as:  VIBRAMYCIN Take 1 capsule (100 mg total) by mouth 2 (two) times daily for 12 days.   enalapril 5 MG tablet Commonly known as:  VASOTEC Take 5 mg by mouth every evening.   gabapentin 600 MG tablet Commonly known as:  NEURONTIN Take 1 tablet (600 mg total) by mouth 3 (three) times daily.  glucose blood test strip Commonly known as:  ACCU-CHEK GUIDE Use as instructed to check blood sugar 4 times daily. DX:E11.65   insulin aspart 100 UNIT/ML injection Commonly known as:  novoLOG Inject 25-40 Units into the skin 3 (three) times daily before meals. Based on sliding scale starting at 25 units for levels between 150-200. Add 5 units as directed   Insulin Degludec 200 UNIT/ML Sopn Commonly known as:  TRESIBA FLEXTOUCH Inject 70 Units into the skin daily. What changed:   when to take this   insulin regular human CONCENTRATED 500 UNIT/ML kwikpen Commonly known as:  HUMULIN R U-500 KWIKPEN INJECT 70 UNITS 30 MINUTES BEFORE MEALS   Insulin Syringe/Needle U-500 31G X 6MM 0.5 ML Misc Commonly known as:  BD INSULIN SYRINGE U-500 1 each by Does not apply route daily. USE INSULIN SYRINGE TO INJECT INSULIN 3 TIMES DAILY.   metFORMIN 500 MG 24 hr tablet Commonly known as:  GLUCOPHAGE-XR Take 4 tablets (2,000 mg total) by mouth daily with supper. What changed:    how much to take  when to take this   Pen Needles 30G X 5 MM Misc 1 each by Does not apply route daily. USE 4 PEN NEEDLES DAILY FOR INSULIN ADMINISTRATION.      Follow-up Information    [facility], [facility] Follow up in 2 week(s).   Specialty:  Internal Medicine Contact information: [place] [address] [phone]          Allergies  Allergen Reactions  . Prozac [Fluoxetine Hcl]     Stiffness in neck, "stroke like" symptoms  . Cephalosporins Other (See Comments)    Unknown childhood reaction  . Latuda [Lurasidone Hcl]     Tongue, confusion     Consultations:  General surgery   Procedures/Studies:  No results found.    Discharge Exam: Vitals:   01/17/18 2157 01/18/18 0516  BP: (!) 130/51 (!) 110/49  Pulse: 98 67  Resp:    Temp: 98.2 F (36.8 C) 98.4 F (36.9 C)  SpO2: 98% 99%   Vitals:   01/17/18 0943 01/17/18 1330 01/17/18 2157 01/18/18 0516  BP: (!) 117/56 120/67 (!) 130/51 (!) 110/49  Pulse: 87 85 98 67  Resp: 18 18    Temp: 98.4 F (36.9 C) 99 F (37.2 C) 98.2 F (36.8 C) 98.4 F (36.9 C)  TempSrc: Oral Oral Oral Oral  SpO2: 96% 98% 98% 99%  Weight:      Height:        General: Pt is alert, awake, not in acute distress, obese Cardiovascular: RRR, S1/S2 +, no rubs, no gallops Respiratory: CTA bilaterally, no wheezing, no rhonchi Abdominal: Soft, NT, ND, bowel sounds + Extremities: no edema, no cyanosis Skin: Left breast and left  inner thigh erythema improved with no purulent exudate noted.  Dressings are clean dry and intact.    The results of significant diagnostics from this hospitalization (including imaging, microbiology, ancillary and laboratory) are listed below for reference.     Microbiology: Recent Results (from the past 240 hour(s))  Blood culture (routine x 2)     Status: None (Preliminary result)   Collection Time: 01/16/18 10:04 PM  Result Value Ref Range Status   Specimen Description BLOOD RIGHT ARM  Final   Special Requests   Final    BOTTLES DRAWN AEROBIC AND ANAEROBIC Blood Culture adequate volume   Culture   Final    NO GROWTH 2 DAYS Performed at [facility], [place]  [address], [place], [place] [address]    Report Status PENDING  Incomplete  Blood culture (routine x 2)     Status: None (Preliminary result)   Collection Time: 01/16/18 10:07 PM  Result Value Ref Range Status   Specimen Description BLOOD RIGHT HAND  Final   Special Requests   Final    BOTTLES DRAWN AEROBIC AND ANAEROBIC Blood Culture adequate volume   Culture   Final    NO GROWTH 2 DAYS Performed at [facility], [address] [place], [place] [address]    Report Status PENDING  Incomplete  Wound or Superficial Culture     Status: None (Preliminary result)   Collection Time: 01/17/18  2:57 AM  Result Value Ref Range Status   Specimen Description   Final    BREAST Performed at [facility], [address] [place], [place] [address]    Special Requests   Final    LEFT Performed at [facility], [address] [place], [place] [address]    Gram Stain   Final    NO WBC SEEN RARE GRAM POSITIVE COCCI RARE GRAM NEGATIVE RODS RARE GRAM VARIABLE ROD    Culture   Final    CULTURE REINCUBATED FOR BETTER GROWTH Performed at [facility], [place] [address] [place],  [address]    Report Status PENDING  Incomplete     Labs: BNP (last 3 results) No results for input(s): BNP in the last 8760 hours. Basic  Metabolic Panel: Recent Labs  Lab 01/16/18 2207 01/17/18 0614  NA 133* 136  K 3.8 4.2  CL 99 101  CO2 22 27  GLUCOSE 362* 397*  BUN 8 8  CREATININE 0.37* 0.48  CALCIUM 8.9 8.7*   Liver Function Tests: Recent Labs  Lab 01/17/18 0614  AST 29  ALT 45*  ALKPHOS 69  BILITOT 0.4  PROT 7.3  ALBUMIN 3.7   No results for input(s): LIPASE, AMYLASE in the last 168 hours. No results for input(s): AMMONIA in the last 168 hours. CBC: Recent Labs  Lab 01/16/18 2207 01/17/18 0614  WBC 10.3 8.9  NEUTROABS 6.3  --   HGB 13.7 13.1  HCT 44.0 42.5  MCV 83.3 85.7  PLT 197 188   Cardiac Enzymes: No results for input(s): CKTOTAL, CKMB, CKMBINDEX, TROPONINI in the last 168 hours. BNP: Invalid input(s): POCBNP CBG: Recent Labs  Lab 01/17/18 1157 01/17/18 1602 01/17/18 2156 01/18/18 0724 01/18/18 1159  GLUCAP 269* 335* 424* 263* 420*   D-Dimer No results for input(s): DDIMER in the last 72 hours. Hgb A1c No results for input(s): HGBA1C in the last 72 hours. Lipid Profile No results for input(s): CHOL, HDL, LDLCALC, TRIG, CHOLHDL, LDLDIRECT in the last 72 hours. Thyroid function studies No results for input(s): TSH, T4TOTAL, T3FREE, THYROIDAB in the last 72 hours.  Invalid input(s): FREET3 Anemia work up No results for input(s): VITAMINB12, FOLATE, FERRITIN, TIBC, IRON, RETICCTPCT in the last 72 hours. Urinalysis    Component Value Date/Time   COLORURINE YELLOW 10/03/2017 1005   APPEARANCEUR Sl Cloudy (A) 10/03/2017 1005   APPEARANCEUR Clear 03/19/2015 1345   LABSPEC >=1.030 (A) 10/03/2017 1005   PHURINE 5.5 10/03/2017 1005   GLUCOSEU 250 (A) 10/03/2017 1005   HGBUR SMALL (A) 10/03/2017 1005   BILIRUBINUR NEGATIVE 10/03/2017 1005   BILIRUBINUR Negative 03/19/2015 1345   KETONESUR NEGATIVE 10/03/2017 1005   PROTEINUR neg 09/02/2015 1505   PROTEINUR NEGATIVE 07/24/2015 2248   UROBILINOGEN 0.2 10/03/2017 1005   NITRITE NEGATIVE  10/03/2017 1005   LEUKOCYTESUR TRACE  (A) 10/03/2017 1005   LEUKOCYTESUR 1+ (A) 03/19/2015 1345   Sepsis Labs Invalid input(s): PROCALCITONIN,  WBC,  LACTICIDVEN Microbiology Recent Results (from the past 240 hour(s))  Blood culture (routine x 2)     Status: None (Preliminary result)   Collection Time: 01/16/18 10:04 PM  Result Value Ref Range Status   Specimen Description BLOOD RIGHT ARM  Final   Special Requests   Final    BOTTLES DRAWN AEROBIC AND ANAEROBIC Blood Culture adequate volume   Culture   Final    NO GROWTH 2 DAYS Performed at [facility], [address] [place], [place] [address]    Report Status PENDING  Incomplete  Blood culture (routine x 2)     Status: None (Preliminary result)   Collection Time: 01/16/18 10:07 PM  Result Value Ref Range Status   Specimen Description BLOOD RIGHT HAND  Final   Special Requests   Final    BOTTLES DRAWN AEROBIC AND ANAEROBIC Blood Culture adequate volume   Culture   Final    NO GROWTH 2 DAYS Performed at [facility], [address], [place], [place] [address]    Report Status PENDING  Incomplete  Wound or Superficial Culture     Status: None (Preliminary result)   Collection Time: 01/17/18  2:57 AM  Result Value Ref Range Status   Specimen Description   Final    BREAST Performed at [facility], [address], [place], [place] [address]    Special Requests   Final    LEFT Performed at [facility], [address] [place], [place] [address]    Gram Stain   Final    NO WBC SEEN RARE GRAM POSITIVE COCCI RARE GRAM NEGATIVE RODS RARE GRAM VARIABLE ROD    Culture   Final    CULTURE REINCUBATED FOR BETTER GROWTH Performed at [facility], [place] [address], [place], [place] [address]    Report Status PENDING  Incomplete     Time coordinating discharge: 35 minutes  SIGNED:   Rodena Goldmann, DO [facility] 01/18/2018, 12:06 PM Pager [phone]  If 7PM-7AM, please contact night-coverage [email or web address] Password [email or web address]

## 2018-01-18 NOTE — Progress Notes (Signed)
IV removed, WNL. D/C instructions given to pt. Verbalized understanding. Gauze on L breast changed. Pt drove herself here, will wheel down to where her vehicle is.

## 2018-01-18 NOTE — Progress Notes (Signed)
[facility]  Dry dressing to the area for drainage. No packing needed. Follow up with PCP. Get BS under control. Keep area  Clean. Finish 14 day total of antibiotic.   Algis Greenhouse, MD [facility] [address] [place], [place] [address] [phone] (office)

## 2018-01-20 LAB — AEROBIC CULTURE W GRAM STAIN (SUPERFICIAL SPECIMEN): Gram Stain: NONE SEEN

## 2018-01-20 LAB — AEROBIC CULTURE  (SUPERFICIAL SPECIMEN)

## 2018-01-21 LAB — CULTURE, BLOOD (ROUTINE X 2)
CULTURE: NO GROWTH
Culture: NO GROWTH
Special Requests: ADEQUATE
Special Requests: ADEQUATE

## 2018-03-08 ENCOUNTER — Ambulatory Visit: Payer: Self-pay | Admitting: Podiatry

## 2018-03-13 ENCOUNTER — Ambulatory Visit (INDEPENDENT_AMBULATORY_CARE_PROVIDER_SITE_OTHER): Payer: Medicaid Other | Admitting: Podiatry

## 2018-03-13 ENCOUNTER — Encounter: Payer: Self-pay | Admitting: Podiatry

## 2018-03-13 ENCOUNTER — Ambulatory Visit: Payer: Medicaid Other

## 2018-03-13 DIAGNOSIS — E1140 Type 2 diabetes mellitus with diabetic neuropathy, unspecified: Secondary | ICD-10-CM | POA: Diagnosis not present

## 2018-03-13 DIAGNOSIS — E1149 Type 2 diabetes mellitus with other diabetic neurological complication: Secondary | ICD-10-CM

## 2018-03-13 DIAGNOSIS — M79671 Pain in right foot: Secondary | ICD-10-CM

## 2018-03-13 DIAGNOSIS — L84 Corns and callosities: Secondary | ICD-10-CM

## 2018-03-13 DIAGNOSIS — L309 Dermatitis, unspecified: Secondary | ICD-10-CM

## 2018-03-13 DIAGNOSIS — M79672 Pain in left foot: Principal | ICD-10-CM

## 2018-03-15 NOTE — Progress Notes (Signed)
Subjective:   Patient ID: Heather [name], female   DOB: 23 y.o.   MRN: [id number]   HPI Patient presents stating that long-term history of neuropathy and that bath water bothers her feet and there is cracking of the heels along with the big toe right foot.  Patient sugar has not been under good control at all and she does have obesity which is certainly a complicating factor.  Patient does not smoke and likes to be active   Review of Systems  All other systems reviewed and are negative.       Objective:  Physical Exam Vitals signs and nursing note reviewed.  Constitutional:      Appearance: She is well-developed.  Pulmonary:     Effort: Pulmonary effort is normal.  Musculoskeletal: Normal range of motion.  Skin:    General: Skin is warm.  Neurological:     Mental Status: She is alert.     Neurovascular status was found to be intact currently with patient showing good range of motion and muscle strength.  Patient has severe cracking of the heel region bilateral with fissuring of the plantar posterior heel with no breakdown of tissue or drainage and has a significant keratotic lesion of the right hallux that is impossible for her to take care of and is painful.  Patient did have good digital perfusion well oriented x3     Assessment:  Obese female with out-of-control diabetes with moderate neurological symptoms based on what she has told me with severe keratotic lesion and cracked skin     Plan:  H&P condition reviewed and today I debrided the lesion on the right hallux to try to take pressure off the toe and I advised on aggressive utilization of Vaseline with Saran wrap and also white socks to be followed by utilization of revised to Derm.  Patient will be seen back for [place] to recheck

## 2018-10-04 ENCOUNTER — Ambulatory Visit: Payer: Medicaid Other | Admitting: Obstetrics and Gynecology

## 2018-10-09 ENCOUNTER — Ambulatory Visit (INDEPENDENT_AMBULATORY_CARE_PROVIDER_SITE_OTHER): Payer: Medicaid Other | Admitting: Obstetrics and Gynecology

## 2018-10-09 ENCOUNTER — Encounter: Payer: Self-pay | Admitting: Obstetrics and Gynecology

## 2018-10-09 ENCOUNTER — Ambulatory Visit: Payer: Medicaid Other | Admitting: Obstetrics and Gynecology

## 2018-10-09 ENCOUNTER — Other Ambulatory Visit: Payer: Self-pay

## 2018-10-09 DIAGNOSIS — N3020 Other chronic cystitis without hematuria: Secondary | ICD-10-CM | POA: Diagnosis not present

## 2018-10-09 DIAGNOSIS — N939 Abnormal uterine and vaginal bleeding, unspecified: Secondary | ICD-10-CM | POA: Insufficient documentation

## 2018-10-09 MED ORDER — NITROFURANTOIN MONOHYD MACRO 100 MG PO CAPS: 100.0000 mg | ORAL_CAPSULE | Freq: Two times a day (BID) | ORAL | 0 refills | Status: DC

## 2018-10-09 MED ORDER — DESOGESTREL-ETHINYL ESTRADIOL 0.15-30 MG-MCG PO TABS: 1.0000 | ORAL_TABLET | Freq: Every day | ORAL | 11 refills | Status: DC

## 2018-10-09 NOTE — Patient Instructions (Signed)
Abnormal Uterine Bleeding °Abnormal uterine bleeding means bleeding more than usual from your uterus. It can include: °· Bleeding between periods. °· Bleeding after sex. °· Bleeding that is heavier than normal. °· Periods that last longer than usual. °· Bleeding after you have stopped having your period (menopause). °There are many problems that may cause this. You should see a doctor for any kind of bleeding that is not normal. Treatment depends on the cause of the bleeding. °Follow these instructions at home: °· Watch your condition for any changes. °· Do not use tampons, douche, or have sex, if your doctor tells you not to. °· Change your pads often. °· Get regular well-woman exams. Make sure they include a pelvic exam and cervical cancer screening. °· Keep all follow-up visits as told by your doctor. This is important. °Contact a doctor if: °· The bleeding lasts more than one week. °· You feel dizzy at times. °· You feel like you are going to throw up (nauseous). °· You throw up. °Get help right away if: °· You pass out. °· You have to change pads every hour. °· You have belly (abdominal) pain. °· You have a fever. °· You get sweaty. °· You get weak. °· You passing large blood clots from your vagina. °Summary °· Abnormal uterine bleeding means bleeding more than usual from your uterus. °· There are many problems that may cause this. You should see a doctor for any kind of bleeding that is not normal. °· Treatment depends on the cause of the bleeding. °This information is not intended to replace advice given to you by your health care provider. Make sure you discuss any questions you have with your health care provider. °Document Released: 10/18/2008 Document Revised: 12/16/2015 Document Reviewed: 12/16/2015 °Elsevier Patient Education © 2020 [employer] Inc[employer] ° °

## 2018-10-09 NOTE — Progress Notes (Signed)
Patient ID: Heather [name], female   DOB: Jun 05, 1995, 23 y.o.   MRN: [id number] [name] presents with several complaints today  She reports irregular cycles since her miscarriage 1-2 yrs ago. She reports only having a day of spotting. No regular bleeding. Episodes of spotting are random as well. She used OCP's as teenage for ovarian cysts. Last contraception was Implanon, but had heavy prolonged bleeding. Currently no contraception. Last intercourse was 6 months ago.  She also reports several month H/O lower abd/pelvic pressure with pain at times. Has been worked up for UTI by PCP but negative. She also reports pain with intercourse.  Type 2 DM, managed by PCP. Last A1c 8.5  TSVD x 1 . First trimester SAB  Last pap over 3 yrs ago  PE AF VSS Lungs clear Heart RRR Abd soft + BS obese diffuse lower abd tenderness, no rebound or gaurding GU Nl EGBUS, cervix no lesion, + Bladder tenderness, uterus small mobile non tender no adnexal masses or tenderness  A/P AUB        Chronic cystitis  Discussed AUB with pt, probably some relationship to her DM, ? PCOS. Will start OCP's. U/R/B reviewed with pt. Rx to pharmacy. Suspect abd/plevic pain/pressure is related to chronic cystitis Reviewed with pt. Will start Macrobid 1 po bid x 7 days and then 1 po qhs x 21 days. Check GYN U/S. F/U in 6-8 weeks

## 2018-10-18 ENCOUNTER — Ambulatory Visit: Payer: Medicaid Other | Admitting: Urology

## 2018-12-19 ENCOUNTER — Other Ambulatory Visit: Payer: Self-pay | Admitting: Obstetrics and Gynecology

## 2018-12-19 DIAGNOSIS — R102 Pelvic and perineal pain: Secondary | ICD-10-CM

## 2018-12-20 ENCOUNTER — Ambulatory Visit: Payer: Medicaid Other | Admitting: Obstetrics and Gynecology

## 2018-12-20 ENCOUNTER — Other Ambulatory Visit: Payer: Medicaid Other

## 2019-02-16 ENCOUNTER — Other Ambulatory Visit: Payer: Self-pay | Admitting: Endocrinology

## 2019-05-21 ENCOUNTER — Telehealth: Payer: Self-pay | Admitting: Adult Health

## 2019-05-21 ENCOUNTER — Encounter: Payer: Self-pay | Admitting: Adult Health

## 2019-05-21 ENCOUNTER — Ambulatory Visit (INDEPENDENT_AMBULATORY_CARE_PROVIDER_SITE_OTHER): Payer: Medicaid Other | Admitting: Adult Health

## 2019-05-21 VITALS — BP 147/88 | HR 92 | Ht 65.0 in | Wt 224.8 lb

## 2019-05-21 DIAGNOSIS — B009 Herpesviral infection, unspecified: Secondary | ICD-10-CM

## 2019-05-21 MED ORDER — VALACYCLOVIR HCL 1 G PO TABS: 1000.0000 mg | ORAL_TABLET | Freq: Two times a day (BID) | ORAL | 2 refills | Status: DC

## 2019-05-21 MED ORDER — LIDOCAINE 2 % EX GEL: CUTANEOUS | 2 refills | Status: DC

## 2019-05-21 MED ORDER — LIDOCAINE 5 % EX OINT: 1.0000 "application " | TOPICAL_OINTMENT | CUTANEOUS | 0 refills | Status: DC | PRN

## 2019-05-21 NOTE — Progress Notes (Signed)
  Subjective:     Patient ID: Heather [name], female   DOB: 28-Sep-1995, 24 y.o.   MRN: [id number]  HPI Heather [name] is a 24 year old white female, singe, G2P0101, worked in for pain in vaginal area, that started Thursday and was seen in ER at [facility] and treated with nystatin. Last sex about 5 months ago.  Review of Systems Has pain in vaginal area, since Thursday, treated with nystatin Friday at [facility] Last sex 5 months ago Reviewed past medical,surgical, social and family history. Reviewed medications and allergies.     Objective:   Physical Exam BP (!) 147/88 (BP Location: Right Arm, Patient Position: Sitting, Cuff Size: Normal)   Pulse 92   Ht 5\' 5"  (1.651 m)   Wt 224 lb 12.8 oz (102 kg)   LMP 04/30/2019   BMI 37.41 kg/m   Skin warm and dry.Pelvic: external genitalia is looks normal, but inner labia has multiple vesicles both sides and back to introitus, has yellowish discharge in folds and,  HSV culture obtained, she was in tears when labia separated. No swollen lymph nodes felt. Examination chaperoned by 05/02/2019 LPN>    Assessment:     1. Herpes HSV culture sent Will rx valtrex and lidocaine gel, use motrin, prn and cold compresses, separate labia when peeing. Meds ordered this encounter  Medications  . valACYclovir (VALTREX) 1000 MG tablet    Sig: Take 1 tablet (1,000 mg total) by mouth 2 (two) times daily.    Dispense:  20 tablet    Refill:  2    Order Specific Question:   Supervising Provider    Answer:   Faith Rogue, LUTHER H [[id number]]  . DISCONTD: lidocaine (XYLOCAINE) 5 % ointment    Sig: Apply 1 application topically as needed.    Dispense:  35.44 g    Refill:  0    Order Specific Question:   Supervising Provider    Answer:   Despina Hidden, LUTHER H [[id number]]  . Lidocaine 2 % GEL    Sig: Use 3-4 x daily to affected area and wash hands good after use    Dispense:  1 Tube    Refill:  2    Order Specific Question:   Supervising Provider    Answer:   Despina Hidden  [[id number]]  Review handout on herpes     Plan:     Follow up in 4 days

## 2019-05-21 NOTE — Patient Instructions (Signed)

## 2019-05-21 NOTE — Telephone Encounter (Signed)
Pt states she is broke out with a yeast infection and is wanting to see if something can be sent in for her. Or if she can be worked in on the schedule today.

## 2019-05-21 NOTE — Telephone Encounter (Signed)
Called patient back, she went to ER and was told she has a yeast infection. She is not feeling any better and now has blisters. Per Victorino Dike patient can come today at 1:30 to be worked in. Informed patient of this and phone call was disconnected. Called patient back and left vm to come at 130 or call back after lunch to get a different appt scheduled.

## 2019-05-23 LAB — HERPES SIMPLEX VIRUS CULTURE

## 2019-05-25 ENCOUNTER — Ambulatory Visit: Payer: Medicaid Other | Admitting: Adult Health

## 2019-12-06 ENCOUNTER — Telehealth: Payer: Self-pay

## 2019-12-10 NOTE — Telephone Encounter (Signed)
Printed vaccine for patient sent to the front office

## 2020-01-06 ENCOUNTER — Encounter (HOSPITAL_COMMUNITY): Payer: Self-pay | Admitting: Emergency Medicine

## 2020-01-06 ENCOUNTER — Other Ambulatory Visit: Payer: Self-pay

## 2020-01-06 ENCOUNTER — Emergency Department (HOSPITAL_COMMUNITY)
Admission: EM | Admit: 2020-01-06 | Discharge: 2020-01-06 | Disposition: A | Discharge status: Home / Self Care | Payer: Medicaid Other | Attending: Emergency Medicine | Admitting: Emergency Medicine

## 2020-01-06 DIAGNOSIS — Z79899 Other long term (current) drug therapy: Secondary | ICD-10-CM | POA: Insufficient documentation

## 2020-01-06 DIAGNOSIS — R220 Localized swelling, mass and lump, head: Secondary | ICD-10-CM | POA: Diagnosis present

## 2020-01-06 DIAGNOSIS — K047 Periapical abscess without sinus: Secondary | ICD-10-CM

## 2020-01-06 DIAGNOSIS — E1140 Type 2 diabetes mellitus with diabetic neuropathy, unspecified: Secondary | ICD-10-CM | POA: Insufficient documentation

## 2020-01-06 DIAGNOSIS — I10 Essential (primary) hypertension: Secondary | ICD-10-CM | POA: Diagnosis not present

## 2020-01-06 DIAGNOSIS — Z794 Long term (current) use of insulin: Secondary | ICD-10-CM | POA: Diagnosis not present

## 2020-01-06 DIAGNOSIS — J45909 Unspecified asthma, uncomplicated: Secondary | ICD-10-CM | POA: Diagnosis not present

## 2020-01-06 DIAGNOSIS — Z7984 Long term (current) use of oral hypoglycemic drugs: Secondary | ICD-10-CM | POA: Diagnosis not present

## 2020-01-06 MED ORDER — DICLOFENAC SODIUM 75 MG PO TBEC: 75.0000 mg | DELAYED_RELEASE_TABLET | Freq: Two times a day (BID) | ORAL | 0 refills | Status: DC

## 2020-01-06 MED ORDER — KETOROLAC TROMETHAMINE 60 MG/2ML IM SOLN
60.0000 mg | Freq: Once | INTRAMUSCULAR | Status: AC
  Administered 2020-01-06: 60 mg via INTRAMUSCULAR
  Filled 2020-01-06: qty 2

## 2020-01-06 NOTE — ED Provider Notes (Signed)
[name] Provider Note   CSN: [id number] Arrival date & time: 01/06/20  1804     History Chief Complaint  Patient presents with  . Facial Swelling    Heather [name] is a 25 y.o. female.  The history is provided by the patient. No language interpreter was used.  Dental Pain Location:  Upper Quality:  Aching Severity:  Severe Onset quality:  Gradual Duration:  4 days Timing:  Constant Progression:  Worsening Chronicity:  New Relieved by:  Nothing Worsened by:  Nothing Associated symptoms: no fever        Past Medical History:  Diagnosis Date  . ADHD (attention deficit hyperactivity disorder)   . Anemia   . Asthma    as a child  . Bipolar 1 disorder ([place])   . Diabetes mellitus    type 2  . Diabetic neuropathy ([place])   . Fatty liver   . GHTN vs. CHTN 06/11/2015  . Hidradenitis 06/05/2012  . History of hiatal hernia   . Irregular bleeding 06/05/2012  . Mononucleosis january 2014  . Nausea and vomiting 01/21/2015  . Obesity (BMI 30-39.9)   . Other and unspecified ovarian cyst 07/24/2012   Right  Ovarian cyst 8 cm on [place] 7/15 in ER  . Pneumonia   . Pregnant 01/21/2015  . Schizo affective schizophrenia ([place])   . Seizures ([place])    febrile seizures as a child  . Sleep apnea    no cpap    Patient Active Problem List   Diagnosis Date Noted  . Abnormal uterine bleeding (AUB) 10/09/2018  . Chronic cystitis 10/09/2018  . Uncontrolled type 1 diabetes mellitus with hyperglycemia ([place]) 11/03/2016  . Diabetes mellitus affecting pregnancy in first trimester 10/14/2016  . Essential hypertension, benign 10/12/2016  . Class 2 severe obesity due to excess calories with serious comorbidity and body mass index (BMI) of 35.0 to 35.9 in adult ([place]) 10/04/2016  . Personal history of noncompliance with medical treatment, presenting hazards to health 10/04/2016  . Low back pain 03/25/2016  . History of gestational hypertension 10/20/2015  . History of preterm  delivery, currently pregnant 10/20/2015  . Depression 07/16/2015  . Elevated LFTs 07/10/2015  . Encounter to determine fetal viability of pregnancy 10/23/2013  . Deficiency anemia 07/15/2013  . Microalbuminuric diabetic nephropathy ([place]) 04/17/2013  . Hidradenitis 06/05/2012  . Bipolar disorder, unspecified ([place]) 03/21/2012  . Diabetes mellitus, type 2 ([place]) 03/21/2012  . Asthma, chronic 03/21/2012  . Hypercholesteremia 01/04/2012  . Palpitations 01/04/2012  . Bulimia nervosa, purging type 11/05/2010  . Moderate mood disorder ([place]) 11/05/2010    Past Surgical History:  Procedure Laterality Date  . ADENOIDECTOMY    . ANTERIOR CRUCIATE LIGAMENT REPAIR    . 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 TONSILLECTOMY    . WISDOM TOOTH EXTRACTION       OB History    Gravida  2   Para  1   Term      Preterm  1   AB      Living  1     SAB      IAB      Ectopic      Multiple  0   Live Births  1           Family History  Problem Relation Age of Onset  . Diabetes Mother   . Hypertension  Mother   . Bipolar disorder Father   . Bipolar disorder Brother   . Anxiety disorder Brother   . Asthma Sister        intrinsic  . Stroke Maternal Grandmother   . Cancer Other        colon, lung, brain-paternal side    Social History   Tobacco Use  . Smoking status: Never Smoker  . Smokeless tobacco: Never Used  Vaping Use  . Vaping Use: Never used  Substance Use Topics  . Alcohol use: No  . Drug use: No    Home Medications Prior to Admission medications   Medication Sig Start Date End Date Taking? Authorizing Provider  diclofenac (VOLTAREN) 75 MG EC tablet Take 1 tablet (75 mg total) by mouth 2 (two) times daily. 01/06/20  Yes Caryl Ada K, PA-C  ACCU-CHEK FASTCLIX LANCETS MISC Used to check blood sugars up to four times daily. Patient not taking: Reported on 10/09/2018 07/20/17   Renato Shin,  MD  Blood Glucose Monitoring Suppl (ACCU-CHEK GUIDE ME) w/Device KIT 1 each by Does not apply route daily. USE METER TO CHECK BLOOD SUGAR 4 TIMES DAILY. DX:E11.65 Patient not taking: Reported on 05/21/2019 07/18/17   Elayne Snare, MD  clonazePAM (KLONOPIN) 1 MG tablet Take 1 mg by mouth 3 (three) times daily. 04/19/17   [provider]  dapagliflozin propanediol (FARXIGA) 10 MG TABS tablet Take 10 mg by mouth daily.    [provider]  enalapril (VASOTEC) 5 MG tablet Take 5 mg by mouth every evening.    [provider]  gabapentin (NEURONTIN) 800 MG tablet Take 800 mg by mouth 3 (three) times daily. 03/04/18   [provider]  glucose blood (ACCU-CHEK GUIDE) test strip Use as instructed to check blood sugar 4 times daily. DX:E11.65 Patient not taking: Reported on 05/21/2019 07/18/17   Elayne Snare, MD  insulin aspart (NOVOLOG) 100 UNIT/ML injection Inject 25-40 Units into the skin 3 (three) times daily before meals. Based on sliding scale starting at 25 units for levels between 150-200. Add 5 units as directed    [provider]  Lidocaine 2 % GEL Use 3-4 x daily to affected area and wash hands good after use 05/21/19   Estill Dooms, NP  metformin (FORTAMET) 1000 MG (OSM) 24 hr tablet Take 1,000 mg by mouth 2 (two) times daily. 01/10/18   [provider]  valACYclovir (VALTREX) 1000 MG tablet Take 1 tablet (1,000 mg total) by mouth 2 (two) times daily. 05/21/19   Estill Dooms, NP    Allergies    Prozac [fluoxetine hcl], Cephalosporins, and Latuda [lurasidone hcl]  Review of Systems   Review of Systems  Constitutional: Negative for fever.  All other systems reviewed and are negative.   Physical Exam Updated Vital Signs BP (!) 158/103 (BP Location: Right Arm)   Pulse 77   Temp 98.2 F (36.8 C) (Oral)   Resp 18   Ht 5' 5"  (1.651 m)   Wt 99.8 kg   LMP 01/03/2020   SpO2 99%   BMI 36.61 kg/m   Physical Exam Vitals and nursing  note reviewed.  Constitutional:      Appearance: She is well-developed and well-nourished.  HENT:     Head: Normocephalic.     Mouth/Throat:     Comments: Poor dentition, multiple areas of decay  Eyes:     Extraocular Movements: EOM normal.  Cardiovascular:     Rate and Rhythm: Normal rate.  Pulmonary:  Effort: Pulmonary effort is normal.  Abdominal:     General: There is no distension.  Musculoskeletal:        General: Normal range of motion.     Cervical back: Normal range of motion.  Skin:    General: Skin is warm.  Neurological:     General: No focal deficit present.     Mental Status: She is alert and oriented to person, place, and time.  Psychiatric:        Mood and Affect: Mood and affect and mood normal.     ED Results / Procedures / Treatments   Labs (all labs ordered are listed, but only abnormal results are displayed) Labs Reviewed - No data to display  EKG None  Radiology No results found.  Procedures Procedures (including critical care time)  Medications Ordered in ED Medications  ketorolac (TORADOL) injection 60 mg (has no administration in time range)    ED Course  I have reviewed the triage vital signs and the nursing notes.  Pertinent labs & imaging results that were available during my care of the patient were reviewed by me and considered in my medical decision making (see chart for details).    MDM Rules/Calculators/A&P                          MDM:  Pt seen at [facility] and started on clindamycin on 12/30.  Pt has been seen 2 more times since.  Pt reports pain medication is not working.  She states she is taking antibiotic.  Pt given dental resource guide.  Final Clinical Impression(s) / ED Diagnoses Final diagnoses:  Dental infection    Rx / DC Orders ED Discharge Orders         Ordered    diclofenac (VOLTAREN) 75 MG EC tablet  2 times daily        01/06/20 2031        An After Visit Summary was printed and given to the  patient.    Sidney Ace 01/06/20 2038    Davonna Belling, MD 01/07/20 [phone]

## 2020-01-06 NOTE — ED Notes (Signed)
Pt reports being seen at [facility]   Was in CT scan and it broke   Reports her narcotic pain meds and antibiotics are not controlling her pain

## 2020-01-06 NOTE — ED Triage Notes (Addendum)
Pt has left side facial swelling that began last Thursday. Pt was seen at [facility] twice for the same. Pt states she probably has a bad tooth.  Pt is already taking antibiotics and is here for pain control.

## 2020-01-06 NOTE — Discharge Instructions (Addendum)
Continue clindamycin

## 2020-04-30 ENCOUNTER — Other Ambulatory Visit: Payer: Self-pay

## 2020-04-30 ENCOUNTER — Emergency Department (HOSPITAL_COMMUNITY)
Admission: EM | Admit: 2020-04-30 | Discharge: 2020-05-01 | Disposition: A | Discharge status: Home / Self Care | Payer: Medicaid Other | Attending: Emergency Medicine | Admitting: Emergency Medicine

## 2020-04-30 ENCOUNTER — Encounter (HOSPITAL_COMMUNITY): Payer: Self-pay | Admitting: *Deleted

## 2020-04-30 DIAGNOSIS — Z794 Long term (current) use of insulin: Secondary | ICD-10-CM | POA: Insufficient documentation

## 2020-04-30 DIAGNOSIS — E1065 Type 1 diabetes mellitus with hyperglycemia: Secondary | ICD-10-CM | POA: Diagnosis not present

## 2020-04-30 DIAGNOSIS — I10 Essential (primary) hypertension: Secondary | ICD-10-CM | POA: Diagnosis not present

## 2020-04-30 DIAGNOSIS — J45909 Unspecified asthma, uncomplicated: Secondary | ICD-10-CM | POA: Diagnosis not present

## 2020-04-30 DIAGNOSIS — R110 Nausea: Secondary | ICD-10-CM | POA: Insufficient documentation

## 2020-04-30 DIAGNOSIS — L02211 Cutaneous abscess of abdominal wall: Secondary | ICD-10-CM | POA: Diagnosis not present

## 2020-04-30 DIAGNOSIS — L0291 Cutaneous abscess, unspecified: Secondary | ICD-10-CM

## 2020-04-30 DIAGNOSIS — Z79899 Other long term (current) drug therapy: Secondary | ICD-10-CM | POA: Insufficient documentation

## 2020-04-30 DIAGNOSIS — E1040 Type 1 diabetes mellitus with diabetic neuropathy, unspecified: Secondary | ICD-10-CM | POA: Insufficient documentation

## 2020-04-30 DIAGNOSIS — Z7984 Long term (current) use of oral hypoglycemic drugs: Secondary | ICD-10-CM | POA: Diagnosis not present

## 2020-04-30 LAB — CBC WITH DIFFERENTIAL/PLATELET
Abs Immature Granulocytes: 0.03 10*3/uL (ref 0.00–0.07)
Basophils Absolute: 0.1 10*3/uL (ref 0.0–0.1)
Basophils Relative: 0 %
Eosinophils Absolute: 0.1 10*3/uL (ref 0.0–0.5)
Eosinophils Relative: 1 %
HCT: 42.8 % (ref 36.0–46.0)
Hemoglobin: 14.3 g/dL (ref 12.0–15.0)
Immature Granulocytes: 0 %
Lymphocytes Relative: 32 %
Lymphs Abs: 3.6 10*3/uL (ref 0.7–4.0)
MCH: 28.5 pg (ref 26.0–34.0)
MCHC: 33.4 g/dL (ref 30.0–36.0)
MCV: 85.3 fL (ref 80.0–100.0)
Monocytes Absolute: 0.6 10*3/uL (ref 0.1–1.0)
Monocytes Relative: 5 %
Neutro Abs: 6.8 10*3/uL (ref 1.7–7.7)
Neutrophils Relative %: 62 %
Platelets: 190 10*3/uL (ref 150–400)
RBC: 5.02 MIL/uL (ref 3.87–5.11)
RDW: 12.6 % (ref 11.5–15.5)
WBC: 11.2 10*3/uL — ABNORMAL HIGH (ref 4.0–10.5)
nRBC: 0 % (ref 0.0–0.2)

## 2020-04-30 LAB — I-STAT BETA HCG BLOOD, ED (MC, WL, AP ONLY): I-stat hCG, quantitative: <5 m[IU]/mL (ref <5)

## 2020-04-30 MED ORDER — LIDOCAINE-EPINEPHRINE (PF) 2 %-1:200000 IJ SOLN
20.0000 mL | Freq: Once | INTRAMUSCULAR | Status: AC
  Administered 2020-05-01: 20 mL
  Filled 2020-04-30: qty 20

## 2020-04-30 MED ORDER — FENTANYL CITRATE (PF) 100 MCG/2ML IJ SOLN
100.0000 ug | Freq: Once | INTRAMUSCULAR | Status: AC
Start: 2020-04-30 — End: 2020-04-30
  Administered 2020-04-30: 100 ug via INTRAVENOUS
  Filled 2020-04-30: qty 2

## 2020-04-30 MED ORDER — DOXYCYCLINE HYCLATE 100 MG PO TABS
100.0000 mg | ORAL_TABLET | Freq: Once | ORAL | Status: AC
  Administered 2020-04-30: 100 mg via ORAL
  Filled 2020-04-30: qty 1

## 2020-04-30 MED ORDER — ONDANSETRON HCL 4 MG/2ML IJ SOLN
4.0000 mg | Freq: Once | INTRAMUSCULAR | Status: AC
  Administered 2020-04-30: 4 mg via INTRAVENOUS
  Filled 2020-04-30: qty 2

## 2020-04-30 MED ORDER — SODIUM CHLORIDE 0.9 % IV BOLUS (SEPSIS)
1000.0000 mL | Freq: Once | INTRAVENOUS | Status: AC
  Administered 2020-04-30: 1000 mL via INTRAVENOUS

## 2020-04-30 NOTE — ED Triage Notes (Signed)
Pt with abscess to right lower abd. X 2 days.  Denies any drainage.  Denies any fever or N/V. C/o pain

## 2020-05-01 LAB — BASIC METABOLIC PANEL
Anion gap: 9 (ref 5–15)
BUN: 14 mg/dL (ref 6–20)
CO2: 25 mmol/L (ref 22–32)
Calcium: 9 mg/dL (ref 8.9–10.3)
Chloride: 101 mmol/L (ref 98–111)
Creatinine, Ser: 0.38 mg/dL — ABNORMAL LOW (ref 0.44–1.00)
GFR, Estimated: >60 mL/min (ref >60)
Glucose, Bld: 318 mg/dL — ABNORMAL HIGH (ref 70–99)
Potassium: 3.5 mmol/L (ref 3.5–5.1)
Sodium: 135 mmol/L (ref 135–145)

## 2020-05-01 MED ORDER — FENTANYL CITRATE (PF) 100 MCG/2ML IJ SOLN
100.0000 ug | Freq: Once | INTRAMUSCULAR | Status: AC
  Administered 2020-05-01: 100 ug via INTRAVENOUS
  Filled 2020-05-01: qty 2

## 2020-05-01 MED ORDER — DOXYCYCLINE HYCLATE 100 MG PO CAPS: 100.0000 mg | ORAL_CAPSULE | Freq: Two times a day (BID) | ORAL | 0 refills | Status: DC

## 2020-05-01 MED ORDER — OXYCODONE-ACETAMINOPHEN 5-325 MG PO TABS: 1.0000 | ORAL_TABLET | ORAL | 0 refills | Status: DC | PRN

## 2020-05-01 NOTE — ED Provider Notes (Signed)
[name] EMERGENCY DEPARTMENT Provider Note   CSN: [id number] Arrival date & time: 04/30/20  1927     History Chief Complaint  Patient presents with  . Abscess    Heather [name] is a 25 y.o. female.  The history is provided by the patient.  Abscess Location:  Torso Torso abscess location:  Abd RLQ Abscess quality: fluctuance, induration, painful, redness and warmth   Duration:  2 days Progression:  Worsening Pain details:    Quality:  Dull   Severity:  Severe   Timing:  Constant   Progression:  Worsening Chronicity:  New Context: diabetes   Relieved by:  Nothing Exacerbated by: movement. Associated symptoms: nausea   Associated symptoms: no fever and no vomiting   Risk factors: prior abscess    Patient with history of bipolar, diabetes presents with abscess.  She has had an abscess to her right lower abdomen for up to 2 days.  No drainage.  She reports associated nausea without vomiting. She reports she recently was placed back on ozempic for diabetes    Past Medical History:  Diagnosis Date  . ADHD (attention deficit hyperactivity disorder)   . Anemia   . Asthma    as a child  . Bipolar 1 disorder ([place])   . Diabetes mellitus    type 2  . Diabetic neuropathy ([place])   . Fatty liver   . GHTN vs. CHTN 06/11/2015  . Hidradenitis 06/05/2012  . History of hiatal hernia   . Irregular bleeding 06/05/2012  . Mononucleosis january 2014  . Nausea and vomiting 01/21/2015  . Obesity (BMI 30-39.9)   . Other and unspecified ovarian cyst 07/24/2012   Right  Ovarian cyst 8 cm on [place] 7/15 in ER  . Pneumonia   . Pregnant 01/21/2015  . Schizo affective schizophrenia ([place])   . Seizures ([place])    febrile seizures as a child  . Sleep apnea    no cpap    Patient Active Problem List   Diagnosis Date Noted  . Abnormal uterine bleeding (AUB) 10/09/2018  . Chronic cystitis 10/09/2018  . Uncontrolled type 1 diabetes mellitus with hyperglycemia ([place]) 11/03/2016  . Diabetes mellitus  affecting pregnancy in first trimester 10/14/2016  . Essential hypertension, benign 10/12/2016  . Class 2 severe obesity due to excess calories with serious comorbidity and body mass index (BMI) of 35.0 to 35.9 in adult ([place]) 10/04/2016  . Personal history of noncompliance with medical treatment, presenting hazards to health 10/04/2016  . Low back pain 03/25/2016  . History of gestational hypertension 10/20/2015  . History of preterm delivery, currently pregnant 10/20/2015  . Depression 07/16/2015  . Elevated LFTs 07/10/2015  . Encounter to determine fetal viability of pregnancy 10/23/2013  . Deficiency anemia 07/15/2013  . Microalbuminuric diabetic nephropathy ([place]) 04/17/2013  . Hidradenitis 06/05/2012  . Bipolar disorder, unspecified ([place]) 03/21/2012  . Diabetes mellitus, type 2 ([place]) 03/21/2012  . Asthma, chronic 03/21/2012  . Hypercholesteremia 01/04/2012  . Palpitations 01/04/2012  . Bulimia nervosa, purging type 11/05/2010  . Moderate mood disorder ([place]) 11/05/2010    Past Surgical History:  Procedure Laterality Date  . ADENOIDECTOMY    . ANTERIOR CRUCIATE LIGAMENT REPAIR    . 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 TONSILLECTOMY    . WISDOM TOOTH EXTRACTION       OB History    Gravida  2   Para  1   Term      Preterm  1   AB      Living  1     SAB      IAB      Ectopic      Multiple  0   Live Births  1           Family History  Problem Relation Age of Onset  . Diabetes Mother   . Hypertension Mother   . Bipolar disorder Father   . Bipolar disorder Brother   . Anxiety disorder Brother   . Asthma Sister        intrinsic  . Stroke Maternal Grandmother   . Cancer Other        colon, lung, brain-paternal side    Social History   Tobacco Use  . Smoking status: Never Smoker  . Smokeless tobacco: Never Used  Vaping Use  . Vaping Use: Never used  Substance Use Topics   . Alcohol use: No  . Drug use: No    Home Medications Prior to Admission medications   Medication Sig Start Date End Date Taking? Authorizing Provider  ACCU-CHEK FASTCLIX LANCETS MISC Used to check blood sugars up to four times daily. Patient not taking: Reported on 10/09/2018 07/20/17   Renato Shin, MD  Blood Glucose Monitoring Suppl (ACCU-CHEK GUIDE ME) w/Device KIT 1 each by Does not apply route daily. USE METER TO CHECK BLOOD SUGAR 4 TIMES DAILY. DX:E11.65 Patient not taking: Reported on 05/21/2019 07/18/17   Elayne Snare, MD  clonazePAM (KLONOPIN) 1 MG tablet Take 1 mg by mouth 3 (three) times daily. 04/19/17   [provider]  dapagliflozin propanediol (FARXIGA) 10 MG TABS tablet Take 10 mg by mouth daily.    [provider]  diclofenac (VOLTAREN) 75 MG EC tablet Take 1 tablet (75 mg total) by mouth 2 (two) times daily. 01/06/20   Fransico Meadow, PA-C  enalapril (VASOTEC) 5 MG tablet Take 5 mg by mouth every evening.    [provider]  gabapentin (NEURONTIN) 800 MG tablet Take 800 mg by mouth 3 (three) times daily. 03/04/18   [provider]  glucose blood (ACCU-CHEK GUIDE) test strip Use as instructed to check blood sugar 4 times daily. DX:E11.65 Patient not taking: Reported on 05/21/2019 07/18/17   Elayne Snare, MD  insulin aspart (NOVOLOG) 100 UNIT/ML injection Inject 25-40 Units into the skin 3 (three) times daily before meals. Based on sliding scale starting at 25 units for levels between 150-200. Add 5 units as directed    [provider]  Lidocaine 2 % GEL Use 3-4 x daily to affected area and wash hands good after use 05/21/19   Estill Dooms, NP  metformin (FORTAMET) 1000 MG (OSM) 24 hr tablet Take 1,000 mg by mouth 2 (two) times daily. 01/10/18   [provider]  valACYclovir (VALTREX) 1000 MG tablet Take 1 tablet (1,000 mg total) by mouth 2 (two) times daily. 05/21/19   Estill Dooms, NP    Allergies    Prozac  [fluoxetine hcl], Cephalosporins, and Latuda [lurasidone hcl]  Review of Systems   Review of Systems  Constitutional: Negative for fever.  Gastrointestinal: Positive for nausea. Negative for vomiting.  Skin: Positive for wound.  All other systems reviewed and are negative.   Physical Exam Updated Vital Signs BP 129/70 (BP Location: Left Arm)   Pulse 98   Temp 98.1 F (36.7 C) (Oral)   Resp 17  Ht 1.651 m (_0 )   Wt 95.3 kg   LMP 03/23/2020   SpO2 97%   BMI 34.95 kg/m   Physical Exam CONSTITUTIONAL: Well developed/well nourished, uncomfortable appearing HEAD: Normocephalic/atraumatic EYES: EOMI/PERRL ENMT: Mucous membranes moist NECK: supple no meningeal signs SPINE/BACK:entire spine nontender CV: S1/S2 noted, no murmurs/rubs/gallops noted LUNGS: Lungs are clear to auscultation bilaterally, no apparent distress ABDOMEN: soft, nontender, no rebound or guarding, bowel sounds noted throughout abdomen, abscess noted to right lower quadrant, no crepitus.  See photo below GU:no cva tenderness NEURO: Pt is awake/alert/appropriate, moves all extremitiesx4.  No facial droop.   EXTREMITIES: pulses normal/equal, full ROM SKIN: warm, color normal, abscess noted right lower quadrant PSYCH: no abnormalities of mood noted, alert and oriented to situation    Patient gave verbal permission to utilize photo for medical documentation only The image was not stored on any personal device ED Results / Procedures / Treatments   Labs (all labs ordered are listed, but only abnormal results are displayed) Labs Reviewed  BASIC METABOLIC PANEL - Abnormal; Notable for the following components:      Result Value   Glucose, Bld 318 (*)    Creatinine, Ser 0.38 (*)    All other components within normal limits  CBC WITH DIFFERENTIAL/PLATELET - Abnormal; Notable for the following components:   WBC 11.2 (*)    All other components within normal limits  I-STAT BETA HCG BLOOD, ED ([facility], [facility], [facility]  ONLY)    EKG None  Radiology No results found.  Procedures .Marland KitchenIncision and Drainage  Date/Time: 05/01/2020 1:00 AM Performed by: Ripley Fraise, MD Authorized by: Ripley Fraise, MD   Consent:    Consent obtained:  Verbal   Consent given by:  Patient   Risks discussed:  Incomplete drainage and infection Universal protocol:    Patient identity confirmed:  Verbally with patient Location:    Type:  Abscess   Location:  Trunk   Trunk location:  Abdomen Pre-procedure details:    Skin preparation:  Povidone-iodine Procedure type:    Complexity:  Complex Procedure details:    Incision types:  Single straight   Wound management:  Irrigated with saline   Drainage:  Purulent   Drainage amount:  Copious   Wound treatment:  Wound left open   Packing materials:  None Post-procedure details:    Procedure completion:  Tolerated well, no immediate complications     Medications Ordered in ED Medications  sodium chloride 0.9 % bolus 1,000 mL (0 mLs Intravenous Stopped 05/01/20 0149)  fentaNYL (SUBLIMAZE) injection 100 mcg (100 mcg Intravenous Given 04/30/20 2328)  ondansetron (ZOFRAN) injection 4 mg (4 mg Intravenous Given 04/30/20 2329)  doxycycline (VIBRA-TABS) tablet 100 mg (100 mg Oral Given 04/30/20 2329)  lidocaine-EPINEPHrine (XYLOCAINE W/EPI) 2 %-1:200000 (PF) injection 20 mL (20 mLs Infiltration Given 05/01/20 0149)  fentaNYL (SUBLIMAZE) injection 100 mcg (100 mcg Intravenous Given 05/01/20 0054)    ED Course  I have reviewed the triage vital signs and the nursing notes.  Pertinent labs results that were available during my care of the patient were reviewed by me and considered in my medical decision making (see chart for details).    MDM Rules/Calculators/A&P                          Patient presents for abscess to lower abdominal wall.  Patient is diabetic, has hyperglycemia but no DKA.Marland Kitchen Patient tolerated I&D well.  She is appropriate for discharge home.  Final  Clinical Impression(s) / ED Diagnoses Final diagnoses:  Abscess    Rx / DC Orders ED Discharge Orders         Ordered    oxyCODONE-acetaminophen (PERCOCET) 5-325 MG tablet  Every 4 hours PRN        05/01/20 0119    doxycycline (VIBRAMYCIN) 100 MG capsule  2 times daily        05/01/20 0119           Ripley Fraise, MD 05/01/20 [phone]

## 2020-06-18 ENCOUNTER — Encounter (HOSPITAL_COMMUNITY): Payer: Self-pay

## 2020-06-18 ENCOUNTER — Other Ambulatory Visit: Payer: Self-pay

## 2020-06-18 ENCOUNTER — Emergency Department (HOSPITAL_COMMUNITY)
Admission: EM | Admit: 2020-06-18 | Discharge: 2020-06-18 | Disposition: A | Discharge status: Home / Self Care | Payer: Medicaid Other | Attending: Emergency Medicine | Admitting: Emergency Medicine

## 2020-06-18 DIAGNOSIS — Z7984 Long term (current) use of oral hypoglycemic drugs: Secondary | ICD-10-CM | POA: Insufficient documentation

## 2020-06-18 DIAGNOSIS — Z79899 Other long term (current) drug therapy: Secondary | ICD-10-CM | POA: Insufficient documentation

## 2020-06-18 DIAGNOSIS — E1140 Type 2 diabetes mellitus with diabetic neuropathy, unspecified: Secondary | ICD-10-CM | POA: Insufficient documentation

## 2020-06-18 DIAGNOSIS — L02416 Cutaneous abscess of left lower limb: Secondary | ICD-10-CM | POA: Insufficient documentation

## 2020-06-18 DIAGNOSIS — L0291 Cutaneous abscess, unspecified: Secondary | ICD-10-CM

## 2020-06-18 DIAGNOSIS — L02211 Cutaneous abscess of abdominal wall: Secondary | ICD-10-CM | POA: Insufficient documentation

## 2020-06-18 DIAGNOSIS — J45909 Unspecified asthma, uncomplicated: Secondary | ICD-10-CM | POA: Insufficient documentation

## 2020-06-18 DIAGNOSIS — Z794 Long term (current) use of insulin: Secondary | ICD-10-CM | POA: Diagnosis not present

## 2020-06-18 DIAGNOSIS — I10 Essential (primary) hypertension: Secondary | ICD-10-CM | POA: Insufficient documentation

## 2020-06-18 LAB — URINALYSIS, ROUTINE W REFLEX MICROSCOPIC
Bilirubin Urine: NEGATIVE
Glucose, UA: >=500 mg/dL — AB
Ketones, ur: NEGATIVE mg/dL
Nitrite: NEGATIVE
Protein, ur: NEGATIVE mg/dL
RBC / HPF: >50 RBC/hpf — ABNORMAL HIGH (ref 0–5)
Specific Gravity, Urine: 1.037 — ABNORMAL HIGH (ref 1.005–1.030)
pH: 6 (ref 5.0–8.0)

## 2020-06-18 LAB — CBC WITH DIFFERENTIAL/PLATELET
Abs Immature Granulocytes: 0.02 10*3/uL (ref 0.00–0.07)
Basophils Absolute: 0.1 10*3/uL (ref 0.0–0.1)
Basophils Relative: 1 %
Eosinophils Absolute: 0.1 10*3/uL (ref 0.0–0.5)
Eosinophils Relative: 2 %
HCT: 42.8 % (ref 36.0–46.0)
Hemoglobin: 14.4 g/dL (ref 12.0–15.0)
Immature Granulocytes: 0 %
Lymphocytes Relative: 35 %
Lymphs Abs: 2.6 10*3/uL (ref 0.7–4.0)
MCH: 29 pg (ref 26.0–34.0)
MCHC: 33.6 g/dL (ref 30.0–36.0)
MCV: 86.1 fL (ref 80.0–100.0)
Monocytes Absolute: 0.4 10*3/uL (ref 0.1–1.0)
Monocytes Relative: 5 %
Neutro Abs: 4.2 10*3/uL (ref 1.7–7.7)
Neutrophils Relative %: 57 %
Platelets: 171 10*3/uL (ref 150–400)
RBC: 4.97 MIL/uL (ref 3.87–5.11)
RDW: 12.6 % (ref 11.5–15.5)
WBC: 7.3 10*3/uL (ref 4.0–10.5)
nRBC: 0 % (ref 0.0–0.2)

## 2020-06-18 LAB — BLOOD GAS, VENOUS
Acid-Base Excess: 1.1 mmol/L (ref 0.0–2.0)
Bicarbonate: 24.8 mmol/L (ref 20.0–28.0)
FIO2: 21
O2 Saturation: 80.2 %
Patient temperature: 37.1
pCO2, Ven: 42.7 mmHg — ABNORMAL LOW (ref 44.0–60.0)
pH, Ven: 7.393 (ref 7.250–7.430)
pO2, Ven: 46.9 mmHg — ABNORMAL HIGH (ref 32.0–45.0)

## 2020-06-18 LAB — BASIC METABOLIC PANEL
Anion gap: 8 (ref 5–15)
BUN: 7 mg/dL (ref 6–20)
CO2: 25 mmol/L (ref 22–32)
Calcium: 8.5 mg/dL — ABNORMAL LOW (ref 8.9–10.3)
Chloride: 100 mmol/L (ref 98–111)
Creatinine, Ser: 0.34 mg/dL — ABNORMAL LOW (ref 0.44–1.00)
GFR, Estimated: >60 mL/min (ref >60)
Glucose, Bld: 377 mg/dL — ABNORMAL HIGH (ref 70–99)
Potassium: 3.6 mmol/L (ref 3.5–5.1)
Sodium: 133 mmol/L — ABNORMAL LOW (ref 135–145)

## 2020-06-18 LAB — CBG MONITORING, ED
Glucose-Capillary: 413 mg/dL — ABNORMAL HIGH (ref 70–99)
Glucose-Capillary: 326 mg/dL — ABNORMAL HIGH (ref 70–99)

## 2020-06-18 MED ORDER — LIDOCAINE HCL (PF) 1 % IJ SOLN
5.0000 mL | Freq: Once | INTRAMUSCULAR | Status: AC
  Administered 2020-06-18: 5 mL
  Filled 2020-06-18: qty 30

## 2020-06-18 MED ORDER — DOXYCYCLINE HYCLATE 100 MG PO CAPS: 100.0000 mg | ORAL_CAPSULE | Freq: Two times a day (BID) | ORAL | 0 refills | Status: AC

## 2020-06-18 MED ORDER — SODIUM CHLORIDE 0.9 % IV BOLUS
1000.0000 mL | Freq: Once | INTRAVENOUS | Status: AC
  Administered 2020-06-18: 1000 mL via INTRAVENOUS

## 2020-06-18 MED ORDER — OXYCODONE HCL 5 MG PO TABS
5.0000 mg | ORAL_TABLET | Freq: Once | ORAL | Status: AC
  Administered 2020-06-18: 5 mg via ORAL
  Filled 2020-06-18: qty 1

## 2020-06-18 NOTE — ED Provider Notes (Addendum)
[facility] EMERGENCY DEPARTMENT Provider Note   CSN: [id number] Arrival date & time: 06/18/20  0935     History Chief Complaint  Patient presents with   Abscess    Heather [name] is a 25 y.o. female.  HPI  Patient with significant medical history of ADHD, asthma, bipolar, diabetes type 2, presents with chief complaint of abscesses.  Patient states she noticed 2 abscesses one on her right upper thigh and On her left lower abdomen approximately 3 days ago.  These unfortunately got larger and became more painful, she denies discharge or drainage from the area, she denies systemic infection fevers or chills, patient endorses that she is not well controlled with her diabetes last A1c was 14.  Patient states that she has had abscesses in the past, this is her third one, she supposed to follow-up with a general surgeon for further evaluation.  She denies increased thirst, frequent urination, she denies chest pain, shortness breath, abdominal pain.  Patient denies alleviating factors.  Past Medical History:  Diagnosis Date   ADHD (attention deficit hyperactivity disorder)    Anemia    Asthma    as a child   Bipolar 1 disorder ([place])    Diabetes mellitus    type 2   Diabetic neuropathy ([place])    Fatty liver    GHTN vs. CHTN 06/11/2015   Hidradenitis 06/05/2012   History of hiatal hernia    Irregular bleeding 06/05/2012   Mononucleosis january 2014   Nausea and vomiting 01/21/2015   Obesity (BMI 30-39.9)    Other and unspecified ovarian cyst 07/24/2012   Right  Ovarian cyst 8 cm on [place] 7/15 in ER   Pneumonia    Pregnant 01/21/2015   Schizo affective schizophrenia ([place])    Seizures ([place])    febrile seizures as a child   Sleep apnea    no cpap    Patient Active Problem List   Diagnosis Date Noted   Abnormal uterine bleeding (AUB) 10/09/2018   Chronic cystitis 10/09/2018   Uncontrolled type 1 diabetes mellitus with hyperglycemia ([place]) 11/03/2016   Diabetes mellitus affecting pregnancy  in first trimester 10/14/2016   Essential hypertension, benign 10/12/2016   Class 2 severe obesity due to excess calories with serious comorbidity and body mass index (BMI) of 35.0 to 35.9 in adult [facility]) 10/04/2016   Personal history of noncompliance with medical treatment, presenting hazards to health 10/04/2016   Low back pain 03/25/2016   History of gestational hypertension 10/20/2015   History of preterm delivery, currently pregnant 10/20/2015   Depression 07/16/2015   Elevated LFTs 07/10/2015   Encounter to determine fetal viability of pregnancy 10/23/2013   Deficiency anemia 07/15/2013   Microalbuminuric diabetic nephropathy ([place]) 04/17/2013   Hidradenitis 06/05/2012   Bipolar disorder, unspecified ([place]) 03/21/2012   Diabetes mellitus, type 2 ([place]) 03/21/2012   Asthma, chronic 03/21/2012   Hypercholesteremia 01/04/2012   Palpitations 01/04/2012   Bulimia nervosa, purging type 11/05/2010   Moderate mood disorder ([place]) 11/05/2010    Past Surgical History:  Procedure Laterality Date   ADENOIDECTOMY     ANTERIOR CRUCIATE LIGAMENT REPAIR     INCISION AND DRAINAGE ABSCESS     Recurrent abscesses of labia and groin area with I and D in office and ER   TONSILLECTOMY     WISDOM TOOTH EXTRACTION       OB History     Gravida  2   Para  1   Term      Preterm  1   AB      Living  1      SAB      IAB      Ectopic      Multiple  0   Live Births  1           Family History  Problem Relation Age of Onset   Diabetes Mother    Hypertension Mother    Bipolar disorder Father    Bipolar disorder Brother    Anxiety disorder Brother    Asthma Sister        intrinsic   Stroke Maternal Grandmother    Cancer Other        colon, lung, brain-paternal side    Social History   Tobacco Use   Smoking status: Never   Smokeless tobacco: Never  Vaping Use   Vaping Use: Never used  Substance Use Topics   Alcohol use: No   Drug use: No    Home  Medications Prior to Admission medications   Medication Sig Start Date End Date Taking? Authorizing Provider  doxycycline (VIBRAMYCIN) 100 MG capsule Take 1 capsule (100 mg total) by mouth 2 (two) times daily for 7 days. 06/18/20 06/25/20 Yes Marcello Fennel, PA-C  ACCU-CHEK FASTCLIX LANCETS MISC Used to check blood sugars up to four times daily. Patient not taking: Reported on 10/09/2018 07/20/17   Renato Shin, MD  Blood Glucose Monitoring Suppl (ACCU-CHEK GUIDE ME) w/Device KIT 1 each by Does not apply route daily. USE METER TO CHECK BLOOD SUGAR 4 TIMES DAILY. DX:E11.65 Patient not taking: Reported on 05/21/2019 07/18/17   Elayne Snare, MD  clonazePAM (KLONOPIN) 1 MG tablet Take 1 mg by mouth 3 (three) times daily. 04/19/17   [provider]  dapagliflozin propanediol (FARXIGA) 10 MG TABS tablet Take 10 mg by mouth daily.    [provider]  diclofenac (VOLTAREN) 75 MG EC tablet Take 1 tablet (75 mg total) by mouth 2 (two) times daily. 01/06/20   Fransico Meadow, PA-C  enalapril (VASOTEC) 5 MG tablet Take 5 mg by mouth every evening.    [provider]  gabapentin (NEURONTIN) 800 MG tablet Take 800 mg by mouth 3 (three) times daily. 03/04/18   [provider]  glucose blood (ACCU-CHEK GUIDE) test strip Use as instructed to check blood sugar 4 times daily. DX:E11.65 Patient not taking: Reported on 05/21/2019 07/18/17   Elayne Snare, MD  insulin aspart (NOVOLOG) 100 UNIT/ML injection Inject 25-40 Units into the skin 3 (three) times daily before meals. Based on sliding scale starting at 25 units for levels between 150-200. Add 5 units as directed    [provider]  Lidocaine 2 % GEL Use 3-4 x daily to affected area and wash hands good after use 05/21/19   Estill Dooms, NP  metformin (FORTAMET) 1000 MG (OSM) 24 hr tablet Take 1,000 mg by mouth 2 (two) times daily. 01/10/18   [provider]  oxyCODONE-acetaminophen (PERCOCET) 5-325 MG tablet Take 1  tablet by mouth every 4 (four) hours as needed. 05/01/20   Ripley Fraise, MD  valACYclovir (VALTREX) 1000 MG tablet Take 1 tablet (1,000 mg total) by mouth 2 (two) times daily. 05/21/19   Estill Dooms, NP    Allergies    Prozac [fluoxetine hcl], Cephalosporins, and Latuda [lurasidone hcl]  Review of Systems   Review of Systems  Constitutional:  Negative for chills and fever.  HENT:  Negative for congestion.   Respiratory:  Negative for shortness of breath.   Cardiovascular:  Negative for chest pain.  Gastrointestinal:  Negative for abdominal pain.  Genitourinary:  Negative for enuresis.  Musculoskeletal:  Negative for back pain.  Skin:  Positive for wound. Negative for rash.       Abscess on the left lower abdomen and left upper thigh  Neurological:  Negative for dizziness.  Hematological:  Does not bruise/bleed easily.   Physical Exam Updated Vital Signs BP 136/79 (BP Location: Left Arm)   Pulse 76   Temp 98.8 F (37.1 C) (Oral)   Resp 20   Ht 5' 5"  (1.651 m)   Wt 95.3 kg   LMP 06/17/2020   SpO2 98%   BMI 34.95 kg/m   Physical Exam Vitals and nursing note reviewed.  Constitutional:      General: She is not in acute distress.    Appearance: She is not ill-appearing.  HENT:     Head: Normocephalic and atraumatic.     Nose: No congestion.     Mouth/Throat:     Mouth: Mucous membranes are moist.     Pharynx: Oropharynx is clear.  Eyes:     Conjunctiva/sclera: Conjunctivae normal.  Cardiovascular:     Rate and Rhythm: Normal rate and regular rhythm.     Pulses: Normal pulses.     Heart sounds: No murmur heard.   No friction rub. No gallop.  Pulmonary:     Effort: No respiratory distress.     Breath sounds: No wheezing, rhonchi or rales.  Abdominal:     Palpations: Abdomen is soft.     Tenderness: There is no abdominal tenderness.  Musculoskeletal:     Right lower leg: No edema.     Left lower leg: No edema.  Skin:    General: Skin is warm and dry.      Comments: Patient has a 3 cm in diameter abscess on her left upper thigh, surrounding erythema, no drainage or discharge present, area is warm to the touch, fluctuance and induration present.  Patient also has a 1 cm in diameter abscess on her left lower abdomen, surrounding erythema, no drainage or discharge present, area is warm to the touch, fluctuance induration present.  Neurological:     Mental Status: She is alert.  Psychiatric:        Mood and Affect: Mood normal.    ED Results / Procedures / Treatments   Labs (all labs ordered are listed, but only abnormal results are displayed) Labs Reviewed  BASIC METABOLIC PANEL - Abnormal; Notable for the following components:      Result Value   Sodium 133 (*)    Glucose, Bld 377 (*)    Creatinine, Ser 0.34 (*)    Calcium 8.5 (*)    All other components within normal limits  URINALYSIS, ROUTINE W REFLEX MICROSCOPIC - Abnormal; Notable for the following components:   APPearance CLOUDY (*)    Specific Gravity, Urine 1.037 (*)    Glucose, UA >=500 (*)    Hgb urine dipstick LARGE (*)    Leukocytes,Ua MODERATE (*)    RBC / HPF >50 (*)    Bacteria, UA RARE (*)    All other components within normal limits  BLOOD GAS, VENOUS - Abnormal; Notable for the following components:   pCO2, Ven 42.7 (*)    pO2, Ven 46.9 (*)    All other components within normal limits  CBG MONITORING, ED - Abnormal; Notable for the following components:   Glucose-Capillary  413 (*)    All other components within normal limits  CBG MONITORING, ED - Abnormal; Notable for the following components:   Glucose-Capillary 326 (*)    All other components within normal limits  URINE CULTURE  CBC WITH DIFFERENTIAL/PLATELET    EKG None  Radiology No results found.  Procedures .Marland KitchenIncision and Drainage  Date/Time: 06/18/2020 1:17 PM Performed by: Marcello Fennel, PA-C Authorized by: Marcello Fennel, PA-C   Consent:    Consent obtained:  Verbal    Consent given by:  Patient   Risks discussed:  Bleeding, incomplete drainage, pain, damage to other organs and infection   Alternatives discussed:  No treatment Universal protocol:    Patient identity confirmed:  Verbally with patient Location:    Type:  Abscess   Size:  4   Location:  Lower extremity   Lower extremity location:  Leg   Leg location:  L upper leg Pre-procedure details:    Skin preparation:  Povidone-iodine Sedation:    Sedation type:  None Anesthesia:    Anesthesia method:  Local infiltration   Local anesthetic:  Lidocaine 1% w/o epi Procedure type:    Complexity:  Complex Procedure details:    Ultrasound guidance: no     Needle aspiration: no     Incision types:  Single straight   Incision depth:  Subcutaneous   Wound management:  Probed and deloculated and irrigated with saline   Drainage:  Bloody and purulent   Drainage amount:  Moderate   Wound treatment:  Wound left open   Packing materials:  1/4 in iodoform gauze   Amount 1/4" iodoform:  2 inches Post-procedure details:    Procedure completion:  Tolerated well, no immediate complications .Marland KitchenIncision and Drainage  Date/Time: 06/18/2020 1:18 PM Performed by: Marcello Fennel, PA-C Authorized by: Marcello Fennel, PA-C   Consent:    Consent obtained:  Verbal   Consent given by:  Patient   Risks discussed:  Bleeding, incomplete drainage, pain, damage to other organs and infection   Alternatives discussed:  No treatment Universal protocol:    Patient identity confirmed:  Verbally with patient Location:    Type:  Abscess   Size:  1   Location:  Trunk   Trunk location:  Abdomen Pre-procedure details:    Skin preparation:  Povidone-iodine Sedation:    Sedation type:  None Anesthesia:    Anesthesia method:  Local infiltration   Local anesthetic:  Lidocaine 1% w/o epi Procedure type:    Complexity:  Simple Procedure details:    Ultrasound guidance: no     Needle aspiration: no      Incision types:  Single straight   Incision depth:  Subcutaneous   Wound management:  Probed and deloculated   Drainage:  Bloody and purulent   Drainage amount:  Moderate   Wound treatment:  Wound left open   Packing materials:  None Post-procedure details:    Procedure completion:  Tolerated well, no immediate complications   Medications Ordered in ED Medications  lidocaine (PF) (XYLOCAINE) 1 % injection 5 mL (5 mLs Infiltration Given 06/18/20 1136)  oxyCODONE (Oxy IR/ROXICODONE) immediate release tablet 5 mg (5 mg Oral Given 06/18/20 1131)  sodium chloride 0.9 % bolus 1,000 mL (0 mLs Intravenous Stopped 06/18/20 1329)    ED Course  I have reviewed the triage vital signs and the nursing notes.  Pertinent labs & imaging results that were available during my care of the patient were reviewed by me and  considered in my medical decision making (see chart for details).    MDM Rules/Calculators/A&P                         Initial impression-patient presents with abscesses.  She is alert, does not appear in acute distress, vital signs reassuring.  Will obtain CBC, recommend I&D for improved healing.  Work-up-CBC unremarkable, BMP shows slight hyponatremia of 133, hyperglycemia of 377, venous gas shows CO2 42, PCO2 46.  Repeat CBG 326.  UA shows moderate leukocytes, many red blood cells, white blood cells, rare bacteria 6-10 squamous cells  Reassessment-it was noted that patient had a glucose of 413 concern for DKA versus HHS, will obtain basic lab work-up provide patient with fluids and reassess.  Patient with skin abscess located on the left upper thigh and left lower quadrant of her abdomen were amenable to incision and drainage.  Patient tolerated procedure well.  Abscess on thigh warranted packing, abscess on the left lower abdomen did not warrant packing or drain, no signs of cellulitis surrounding skin.    Repeat blood sugar was 326, no signs of DKA or HSS patient signs remained stable,  she has no complaints this time.  Patient agreed for discharge.  Rule out-low suspicion for HSS or DKA as there is no anion gap present, venous blood gas shows no signs of acidosis.  Low suspicion for systemic infection as patient is nontoxic-appearing, vital signs reassuring.  I have low suspicion for UTI, pyelonephritis, kidney stone as patient denies urinary symptoms,  Patient's UA is abnormal possibly due to the squamous cells altering the lab work.  Will defer antibiotic treatment at this time, culture urine for further evaluation.  Low suspicion for deep tissue infection as after procedure was performed there is no fluctuance present.  Patient is poorly controlled diabetic we will start her on antibiotics.  Plan-  Abscesses-abscess - actively draining, packing in place,  will start her on antibiotics due to her immunocompromise state.  will have her come back in next 48 hours for wound check. Hyperglycemia-poorly controlled, will recommend that she continue with her medications follow-up with her PCP for further evaluation.  Vital signs have remained stable, no indication for hospital admission.   Patient given at home care as well strict return precautions.  Patient verbalized that they understood agreed to said plan.  Final Clinical Impression(s) / ED Diagnoses Final diagnoses:  Abscess    Rx / DC Orders ED Discharge Orders          Ordered    doxycycline (VIBRAMYCIN) 100 MG capsule  2 times daily        06/18/20 1320             Aron Baba 06/18/20 1324    Marcello Fennel, PA-C 06/18/20 1337    Isla Pence, MD 06/18/20 1446

## 2020-06-18 NOTE — Discharge Instructions (Signed)
We have drained the abscesses on you, you have packing in the 1 please leave in.  I recommend applying warm compress to the area 2 times daily for next 7 days.  Started you antibiotics please take as prescribed.  For pain you may take over-the-counter pain medication like ibuprofen Tylenol every 6 hours as needed  You must follow-up in the next 48 hours for a wound check, you may come back to this department, urgent care, your PCP.  Come back to the emergency department if you develop chest pain, shortness of breath, severe abdominal pain, uncontrolled nausea, vomiting, diarrhea.

## 2020-06-18 NOTE — ED Triage Notes (Signed)
Pt presents to ED with abscess to left lower abdomen and left upper leg, denies fever.

## 2020-06-20 LAB — URINE CULTURE: Culture: >=100000 — AB

## 2023-01-05 NOTE — L&D Delivery Note (Signed)
 OB/[facility]N Faculty Practice Delivery Note  Heather [name] is a 28 y.o. [id number] s/p vaginal delivery at [redacted]w[redacted]d. She was admitted for IOL for uncontrolled T2DM.   ROM: 2h 73m with clear fluid GBS Status: unknown, treated Maximum Maternal Temperature: wnl  Labor Progress: Initial SVE: not documented. She then progressed to complete with augmentation via cytotec , arom, and pitocin .  Delivery Date/Time: 16:35 11/02/2023 Delivery: Called to room and patient was complete and pushing. Head delivered OA. No nuchal cord present. Shoulder and body delivered in usual fashion. Infant with spontaneous cry, placed on mother's abdomen, dried and stimulated. Cord clamped x 2 after 1-minute delay, and cut by father. Cord blood drawn. Placenta delivered spontaneously with gentle cord traction. Fundus firm with massage and Pitocin . Labia, perineum, vagina, and cervix inspected inspected with no lacerations.  Baby Weight: pending  Placenta: Sent to L&D Complications: None Lacerations: none EBL: 47 mL Analgesia: Epidural   Infant:  APGAR (1 MIN):   APGAR (5 MINS):   APGAR (10 MINS):     Devaughn Ban, MD Center for [facility], [facility] 11/03/2023, 10:04 AM

## 2023-03-17 ENCOUNTER — Other Ambulatory Visit: Payer: MEDICAID

## 2023-03-17 DIAGNOSIS — Z3201 Encounter for pregnancy test, result positive: Secondary | ICD-10-CM

## 2023-03-18 ENCOUNTER — Other Ambulatory Visit: Payer: Self-pay | Admitting: Adult Health

## 2023-03-18 DIAGNOSIS — Z3201 Encounter for pregnancy test, result positive: Secondary | ICD-10-CM

## 2023-03-18 LAB — BETA HCG QUANT (REF LAB): hCG Quant: 223 m[IU]/mL

## 2023-03-21 ENCOUNTER — Other Ambulatory Visit: Payer: MEDICAID

## 2023-03-22 LAB — BETA HCG QUANT (REF LAB): hCG Quant: 1226 m[IU]/mL

## 2023-03-28 ENCOUNTER — Encounter: Payer: Self-pay | Admitting: Obstetrics & Gynecology

## 2023-03-28 ENCOUNTER — Ambulatory Visit (INDEPENDENT_AMBULATORY_CARE_PROVIDER_SITE_OTHER): Payer: MEDICAID | Admitting: Obstetrics & Gynecology

## 2023-03-28 VITALS — BP 129/85 | HR 85 | Ht 64.0 in | Wt 196.2 lb

## 2023-03-28 DIAGNOSIS — Z794 Long term (current) use of insulin: Secondary | ICD-10-CM

## 2023-03-28 DIAGNOSIS — E119 Type 2 diabetes mellitus without complications: Secondary | ICD-10-CM | POA: Diagnosis not present

## 2023-03-28 DIAGNOSIS — K219 Gastro-esophageal reflux disease without esophagitis: Secondary | ICD-10-CM | POA: Diagnosis not present

## 2023-03-28 DIAGNOSIS — F419 Anxiety disorder, unspecified: Secondary | ICD-10-CM

## 2023-03-28 DIAGNOSIS — F1190 Opioid use, unspecified, uncomplicated: Secondary | ICD-10-CM | POA: Diagnosis not present

## 2023-03-28 DIAGNOSIS — Z3490 Encounter for supervision of normal pregnancy, unspecified, unspecified trimester: Secondary | ICD-10-CM

## 2023-03-28 MED ORDER — PRENATAL VITAMIN PLUS LOW IRON 27-1 MG PO TABS: 1.0000 | ORAL_TABLET | Freq: Every day | ORAL | 4 refills | Status: AC

## 2023-03-28 MED ORDER — GLUCOSE BLOOD VI STRP: ORAL_STRIP | 12 refills | Status: DC

## 2023-03-28 MED ORDER — SERTRALINE HCL 25 MG PO TABS: 25.0000 mg | ORAL_TABLET | Freq: Every day | ORAL | 0 refills | Status: DC

## 2023-03-28 MED ORDER — BUPRENORPHINE HCL-NALOXONE HCL 8-2 MG SL FILM: 1.0000 | ORAL_FILM | Freq: Three times a day (TID) | SUBLINGUAL | 0 refills | Status: DC

## 2023-03-28 MED ORDER — METFORMIN HCL ER 500 MG PO TB24: 1000.0000 mg | ORAL_TABLET | Freq: Two times a day (BID) | ORAL | 11 refills | Status: AC

## 2023-03-28 MED ORDER — ACCU-CHEK SOFTCLIX LANCETS MISC: 12 refills | Status: DC

## 2023-03-28 MED ORDER — OMEPRAZOLE 20 MG PO CPDR: 20.0000 mg | DELAYED_RELEASE_CAPSULE | Freq: Every day | ORAL | 11 refills | Status: DC

## 2023-03-28 MED ORDER — ACCU-CHEK GUIDE ME W/DEVICE KIT: 1.0000 | PACK | Freq: Four times a day (QID) | 0 refills | Status: DC

## 2023-03-28 MED ORDER — HYDROXYZINE HCL 10 MG PO TABS: 10.0000 mg | ORAL_TABLET | Freq: Three times a day (TID) | ORAL | 6 refills | Status: DC | PRN

## 2023-03-28 NOTE — Progress Notes (Signed)
 GYN VISIT Patient name: Heather [name] MRN [id number]  Date of birth: 08-Apr-1995 Chief Complaint:   Establish Care  History of Present Illness:   Heather [name] is a 28 y.o. [phone] currently pregnant who presents for medications refill.  Patient has been seen in the health department and has been on either Subutex or Suboxone for over the last year.  She was informed that now that she is pregnant she needs to receive this medication from our office.  She has not had this medication in the past week and is concerned about withdrawal.  She typically uses 4 mg either all at once or 4 times daily.  She feels like this is a lot and does not need that much.   Additionally she notes concerns about anxiety.  In the past she has been on benzodiazepines and realizes that she cannot be on that during pregnancy.  She has been on hydroxyzine before and was told that this "was not safe during pregnancy ".   She also notes concerns about nausea, uncontrolled diabetes and acid reflux.   She denies vaginal bleeding or discharge.  She denies abdominal or pelvic pain.    No LMP recorded. Patient is pregnant.    Review of Systems:   Pertinent items are noted in HPI  Pertinent History Reviewed:   Past Surgical History:  Procedure Laterality Date   ADENOIDECTOMY     ANTERIOR CRUCIATE LIGAMENT REPAIR     INCISION AND DRAINAGE ABSCESS     Recurrent abscesses of labia and groin area with I and D in office and ER   TONSILLECTOMY     WISDOM TOOTH EXTRACTION      Past Medical History:  Diagnosis Date   ADHD (attention deficit hyperactivity disorder)    Anemia    Asthma    as a child   Bipolar 1 disorder ([facility])    Diabetes mellitus    type 2   Diabetic neuropathy ([facility])    Fatty liver    GHTN vs. CHTN 06/11/2015   Hidradenitis 06/05/2012   History of hiatal hernia    Irregular bleeding 06/05/2012   Mononucleosis january 2014   Nausea and vomiting 01/21/2015   Obesity (BMI 30-39.9)    Other and  unspecified ovarian cyst 07/24/2012   Right  Ovarian cyst 8 cm on [place] 7/15 in ER   Pneumonia    Pregnant 01/21/2015   Schizo affective schizophrenia ([facility])    Seizures ([facility])    febrile seizures as a child   Sleep apnea    no cpap   Reviewed problem list, medications and allergies. Physical Assessment:   Vitals:   03/28/23 1544 03/28/23 1618  BP: (!) 154/84 129/85  Pulse: 82 85  Weight: 196 lb 3.2 oz (89 kg)   Height: 5\' 4"  (1.626 m)   Body mass index is 33.68 kg/m.       Physical Examination:   General appearance: alert, well appearing, and in no distress  Psych: mood appropriate, normal affect  Skin: warm & dry   Cardiovascular: normal heart rate noted  Respiratory: normal respiratory effort, no distress  Extremities: no edema   Chaperone: N/A    Assessment & Plan:  -Early pregnancy with OUD, Type 2 DM, Anxiety, nausea/vomiting, acid reflux  -Reviewed the process of early pregnancy with patient and reassured her that at this point it is too early to listen by Doppler.  She is scheduled for early ultrasound April 7 -Plan to send in Suboxone 4 mg  film 3 times daily, patient is interested in [facility] -Plan to start Zoloft 25 mg daily and will then increase over the next few months -Okay for hydroxyzine  TYpe 2 DM -Plan to check A1c with initial prenatal lab work -Will refill metformin -Prescription for glucometer and testing strips sent in  Meds ordered this encounter  Medications   Buprenorphine HCl-Naloxone HCl (SUBOXONE) 8-2 MG FILM    Sig: Place 1 Film under the tongue in the morning, at noon, and at bedtime for 14 days.    Dispense:  42 Film    Refill:  0   metFORMIN (GLUCOPHAGE-XR) 500 MG 24 hr tablet    Sig: Take 2 tablets (1,000 mg total) by mouth 2 (two) times daily with a meal.    Dispense:  120 tablet    Refill:  11   sertraline (ZOLOFT) 25 MG tablet    Sig: Take 1 tablet (25 mg total) by mouth daily.    Dispense:  30 tablet    Refill:  0    hydrOXYzine (ATARAX) 10 MG tablet    Sig: Take 1 tablet (10 mg total) by mouth 3 (three) times daily as needed.    Dispense:  30 tablet    Refill:  6   omeprazole (PRILOSEC) 20 MG capsule    Sig: Take 1 capsule (20 mg total) by mouth daily.    Dispense:  30 capsule    Refill:  11   Prenatal Vit-Fe Fumarate-FA (PRENATAL VITAMIN PLUS LOW IRON) 27-1 MG TABS    Sig: Take 1 tablet by mouth daily at 6 (six) AM.    Dispense:  90 tablet    Refill:  4   Blood Glucose Monitoring Suppl (ACCU-CHEK GUIDE ME) w/Device KIT    Sig: 1 each by Does not apply route 4 (four) times daily.    Dispense:  1 kit    Refill:  0    Please use [id number]  RXPCN:[id number]   [id number]  [id number]    Issuer:[id number]   Accu-Chek Softclix Lancets lancets    Sig: Use as instructed to check blood sugar 4 times daily    Dispense:  100 each    Refill:  12   glucose blood test strip    Sig: Use as instructed to check blood sugar four times daily    Dispense:  100 each    Refill:  12     Orders Placed This Encounter  Procedures   ToxASSURE Select 13 (MW), Urine    Return for HROB only in 2-4wks.   Myna Hidalgo, DO Attending Obstetrician & Gynecologist, [facility] for [employer], [facility]

## 2023-03-29 ENCOUNTER — Ambulatory Visit: Payer: MEDICAID | Admitting: Radiology

## 2023-03-29 ENCOUNTER — Other Ambulatory Visit: Payer: Self-pay | Admitting: Obstetrics & Gynecology

## 2023-03-29 DIAGNOSIS — O99321 Drug use complicating pregnancy, first trimester: Secondary | ICD-10-CM | POA: Diagnosis not present

## 2023-03-29 DIAGNOSIS — F1120 Opioid dependence, uncomplicated: Secondary | ICD-10-CM

## 2023-03-29 DIAGNOSIS — Z3A01 Less than 8 weeks gestation of pregnancy: Secondary | ICD-10-CM

## 2023-03-29 DIAGNOSIS — O3680X0 Pregnancy with inconclusive fetal viability, not applicable or unspecified: Secondary | ICD-10-CM

## 2023-03-29 DIAGNOSIS — O24111 Pre-existing diabetes mellitus, type 2, in pregnancy, first trimester: Secondary | ICD-10-CM

## 2023-03-29 DIAGNOSIS — O24119 Pre-existing diabetes mellitus, type 2, in pregnancy, unspecified trimester: Secondary | ICD-10-CM

## 2023-03-29 NOTE — Progress Notes (Signed)
 [place]:  unknown LMP  TA and TV imaging performed - chaperone: Quila - vinyly probe cover used  Anteverted uterus with early IUP:   GS = 13.2 mm = 6+1 weeks,  GS intact within mid to upper cavity,  Yolk Sac = 4.3 mm,  too early to see embryonic pole,   Normal Right ov with CL = 220 x 16 mm - normal Left ovary - neg adnexal regions - neg CDS, no free fluid   Patient scheduled for return Ob U/S for dating and viability in 2 weeks

## 2023-03-31 LAB — TOXASSURE SELECT 13 (MW), URINE

## 2023-04-08 ENCOUNTER — Other Ambulatory Visit: Payer: Self-pay | Admitting: Obstetrics & Gynecology

## 2023-04-08 DIAGNOSIS — F1190 Opioid use, unspecified, uncomplicated: Secondary | ICD-10-CM

## 2023-04-11 ENCOUNTER — Encounter: Payer: Self-pay | Admitting: Obstetrics & Gynecology

## 2023-04-11 ENCOUNTER — Other Ambulatory Visit: Payer: Self-pay | Admitting: Obstetrics & Gynecology

## 2023-04-11 ENCOUNTER — Other Ambulatory Visit: Payer: MEDICAID

## 2023-04-11 DIAGNOSIS — O3680X0 Pregnancy with inconclusive fetal viability, not applicable or unspecified: Secondary | ICD-10-CM

## 2023-04-11 DIAGNOSIS — F1190 Opioid use, unspecified, uncomplicated: Secondary | ICD-10-CM

## 2023-04-11 MED ORDER — BUPRENORPHINE HCL-NALOXONE HCL 8-2 MG SL FILM: 1.0000 | ORAL_FILM | Freq: Three times a day (TID) | SUBLINGUAL | 0 refills | Status: DC

## 2023-04-12 ENCOUNTER — Ambulatory Visit: Payer: MEDICAID

## 2023-04-12 ENCOUNTER — Other Ambulatory Visit: Payer: Self-pay | Admitting: Obstetrics & Gynecology

## 2023-04-12 DIAGNOSIS — O3680X0 Pregnancy with inconclusive fetal viability, not applicable or unspecified: Secondary | ICD-10-CM

## 2023-04-12 DIAGNOSIS — Z3481 Encounter for supervision of other normal pregnancy, first trimester: Secondary | ICD-10-CM | POA: Diagnosis not present

## 2023-04-12 DIAGNOSIS — Z3A01 Less than 8 weeks gestation of pregnancy: Secondary | ICD-10-CM | POA: Diagnosis not present

## 2023-04-12 NOTE — Progress Notes (Signed)
 TRANSVAGINAL US: 7+5 weeks fundal single intrauterine pregnancy with yolk sac,crown rump length 14.13 mm,fetal heart rate 164 bpm,normal ovaries,no free fluid,no pain during ultrasound  Chaperone Whitney

## 2023-04-18 ENCOUNTER — Other Ambulatory Visit: Payer: Self-pay | Admitting: Obstetrics & Gynecology

## 2023-04-20 ENCOUNTER — Other Ambulatory Visit: Payer: Self-pay | Admitting: *Deleted

## 2023-04-21 ENCOUNTER — Other Ambulatory Visit: Payer: Self-pay | Admitting: Obstetrics & Gynecology

## 2023-04-21 DIAGNOSIS — F1190 Opioid use, unspecified, uncomplicated: Secondary | ICD-10-CM

## 2023-04-22 ENCOUNTER — Other Ambulatory Visit: Payer: Self-pay | Admitting: Obstetrics & Gynecology

## 2023-04-22 DIAGNOSIS — F1190 Opioid use, unspecified, uncomplicated: Secondary | ICD-10-CM

## 2023-04-25 ENCOUNTER — Other Ambulatory Visit: Payer: Self-pay | Admitting: Obstetrics & Gynecology

## 2023-04-26 ENCOUNTER — Telehealth: Payer: Self-pay | Admitting: *Deleted

## 2023-04-26 MED ORDER — SERTRALINE HCL 25 MG PO TABS: 25.0000 mg | ORAL_TABLET | Freq: Every day | ORAL | 0 refills | Status: DC

## 2023-04-26 NOTE — Telephone Encounter (Signed)
 Pt states all of her meds were in her car that was towed. Pt aware that Suboxone  will not be refilled without her being seen. Pt states she can't keep anything down. Pt's mouth is dry and she is urinating 1-2 times a day. Pt was advised to go to hospital for fluids. JSY

## 2023-05-04 ENCOUNTER — Ambulatory Visit: Payer: MEDICAID | Admitting: Obstetrics & Gynecology

## 2023-05-04 ENCOUNTER — Encounter: Payer: Self-pay | Admitting: Obstetrics & Gynecology

## 2023-05-04 VITALS — BP 127/75 | HR 82 | Ht 65.0 in | Wt 196.0 lb

## 2023-05-04 DIAGNOSIS — Z3A00 Weeks of gestation of pregnancy not specified: Secondary | ICD-10-CM

## 2023-05-04 DIAGNOSIS — O24319 Unspecified pre-existing diabetes mellitus in pregnancy, unspecified trimester: Secondary | ICD-10-CM | POA: Diagnosis not present

## 2023-05-04 DIAGNOSIS — O219 Vomiting of pregnancy, unspecified: Secondary | ICD-10-CM

## 2023-05-04 DIAGNOSIS — O0991 Supervision of high risk pregnancy, unspecified, first trimester: Secondary | ICD-10-CM

## 2023-05-04 DIAGNOSIS — F1190 Opioid use, unspecified, uncomplicated: Secondary | ICD-10-CM

## 2023-05-04 DIAGNOSIS — L309 Dermatitis, unspecified: Secondary | ICD-10-CM

## 2023-05-04 DIAGNOSIS — I10 Essential (primary) hypertension: Secondary | ICD-10-CM | POA: Diagnosis not present

## 2023-05-04 DIAGNOSIS — O0990 Supervision of high risk pregnancy, unspecified, unspecified trimester: Secondary | ICD-10-CM

## 2023-05-04 MED ORDER — DOXYLAMINE-PYRIDOXINE 10-10 MG PO TBEC
2.0000 | DELAYED_RELEASE_TABLET | Freq: Every evening | ORAL | 3 refills | Status: DC
Start: 2023-05-04 — End: 2023-09-05

## 2023-05-04 MED ORDER — BUPRENORPHINE HCL-NALOXONE HCL 8-2 MG SL FILM
1.0000 | ORAL_FILM | Freq: Three times a day (TID) | SUBLINGUAL | 0 refills | Status: DC
Start: 2023-05-04 — End: 2023-05-17

## 2023-05-04 MED ORDER — BLOOD PRESSURE MONITOR MISC: 0 refills | Status: DC

## 2023-05-04 MED ORDER — TRIAMCINOLONE ACETONIDE 0.5 % EX OINT
1.0000 | TOPICAL_OINTMENT | Freq: Two times a day (BID) | CUTANEOUS | 0 refills | Status: DC
Start: 2023-05-04 — End: 2023-07-28

## 2023-05-04 MED ORDER — ONDANSETRON 4 MG PO TBDP: 4.0000 mg | ORAL_TABLET | Freq: Three times a day (TID) | ORAL | 1 refills | Status: DC | PRN

## 2023-05-04 NOTE — Progress Notes (Addendum)
 GYN VISIT Patient name: Heather [name] MRN [id number]  Date of birth: Nov 20, 1995 Chief Complaint:   Follow-up (Meds review)  History of Present Illness:   Heather [name] is a 28 y.o. [phone] in early pregnancy female being seen today for the following:  OUD Seems to be doing well with the Suboxone  though she is concerned that it is making her nauseous.  She reports considerable nausea and vomiting.  She just got the Diclegis  which does not seem to be helping.  She also had Zofran  and did not note significant improvement with this.  Denies vaginal bleeding or cramping.  She is also concerned about her blood pressure.  1 time while she was at [facility] she checked it and it was 160/80.  She denies history of hypertension however it is listed in her problem visit.    No LMP recorded (lmp unknown). Patient is pregnant.    Review of Systems:   Pertinent items are noted in HPI Denies fever/chills, dizziness, headaches, visual disturbances, fatigue, shortness of breath, chest pain, abdominal pain, vomiting Pertinent History Reviewed:   Past Surgical History:  Procedure Laterality Date   ADENOIDECTOMY     ANTERIOR CRUCIATE LIGAMENT REPAIR     INCISION AND DRAINAGE ABSCESS     Recurrent abscesses of labia and groin area with I and D in office and ER   TONSILLECTOMY     WISDOM TOOTH EXTRACTION      Past Medical History:  Diagnosis Date   ADHD (attention deficit hyperactivity disorder)    Anemia    Asthma    as a child   Bipolar 1 disorder (HCC)    Diabetes mellitus    type 2   Diabetic neuropathy (HCC)    Fatty liver    GHTN vs. CHTN 06/11/2015   Hidradenitis 06/05/2012   History of hiatal hernia    Irregular bleeding 06/05/2012   Mononucleosis january 2014   Nausea and vomiting 01/21/2015   Obesity (BMI 30-39.9)    Other and unspecified ovarian cyst 07/24/2012   Right  Ovarian cyst 8 cm on US  7/15 in ER   Pneumonia    Pregnant 01/21/2015   Schizo affective schizophrenia (HCC)     Seizures (HCC)    febrile seizures as a child   Sleep apnea    no cpap   Reviewed problem list, medications and allergies. Physical Assessment:   Vitals:   05/04/23 1202 05/04/23 1222  BP: (!) 144/75 127/75  Pulse: 85 82  Weight: 196 lb (88.9 kg)   Height: 5\' 5"  (1.651 m)   Body mass index is 32.62 kg/m.       Physical Examination:   General appearance: alert, well appearing, and in no distress  Psych: mood appropriate, normal affect  Skin: warm & dry   Cardiovascular: normal heart rate noted  Respiratory: normal respiratory effort, no distress  Abdomen: soft, non-tender   Pelvic: examination not indicated  Extremities: no edema   Chaperone: N/A    Assessment & Plan:  1) OUD -plan to refill suboxone  - Reassured patient that it was not the Suboxone  that was making her nauseous but more likely the pregnancy  2) HTN - Repeat BP within normal limits, will hold off on starting medication at this time though will likely plan to start procardia  at next visit pending BPs - Advised patient to start tracking her BP at home - Rx sent in for BP cuff  3) Class DF - Not yet addressed during this pregnancy.  As above initial OB and lab work to be done in [facility] as though patient is in between PCP though care through [facility]   - Patient has initial OB visit scheduled for 513  4) Dermatitis/eczema -cream sent in   Orders Placed This Encounter  Procedures   ToxASSURE Select 13 (MW), Urine    Return for 2 weeks as scheduled.   Jeri Jeanbaptiste, DO Attending Obstetrician & Gynecologist, [facility] for [employer], [facility]

## 2023-05-04 NOTE — Addendum Note (Signed)
 Addended by: Myrl Askew on: 05/04/2023 01:31 PM   Modules accepted: Orders

## 2023-05-04 NOTE — Addendum Note (Signed)
 Addended by: Christel Cousins on: 05/04/2023 04:50 PM   Modules accepted: Orders

## 2023-05-07 LAB — TOXASSURE SELECT 13 (MW), URINE

## 2023-05-16 ENCOUNTER — Other Ambulatory Visit: Payer: Self-pay | Admitting: Obstetrics & Gynecology

## 2023-05-16 DIAGNOSIS — Z3682 Encounter for antenatal screening for nuchal translucency: Secondary | ICD-10-CM

## 2023-05-17 ENCOUNTER — Encounter: Payer: Self-pay | Admitting: Women's Health

## 2023-05-17 ENCOUNTER — Other Ambulatory Visit: Payer: Self-pay | Admitting: Obstetrics & Gynecology

## 2023-05-17 ENCOUNTER — Other Ambulatory Visit: Payer: MEDICAID

## 2023-05-17 ENCOUNTER — Encounter: Payer: MEDICAID | Admitting: Women's Health

## 2023-05-17 ENCOUNTER — Encounter: Payer: MEDICAID | Admitting: *Deleted

## 2023-05-17 DIAGNOSIS — O0990 Supervision of high risk pregnancy, unspecified, unspecified trimester: Secondary | ICD-10-CM | POA: Insufficient documentation

## 2023-05-17 DIAGNOSIS — F1190 Opioid use, unspecified, uncomplicated: Secondary | ICD-10-CM

## 2023-05-17 DIAGNOSIS — O219 Vomiting of pregnancy, unspecified: Secondary | ICD-10-CM

## 2023-05-18 MED ORDER — BUPRENORPHINE HCL-NALOXONE HCL 8-2 MG SL FILM: 1.0000 | ORAL_FILM | Freq: Three times a day (TID) | SUBLINGUAL | 0 refills | Status: AC

## 2023-05-18 MED ORDER — ONDANSETRON 4 MG PO TBDP
4.0000 mg | ORAL_TABLET | Freq: Three times a day (TID) | ORAL | 1 refills | Status: DC | PRN
Start: 2023-05-18 — End: 2023-09-05

## 2023-05-24 ENCOUNTER — Encounter: Payer: Self-pay | Admitting: Women's Health

## 2023-05-24 ENCOUNTER — Encounter: Payer: MEDICAID | Admitting: *Deleted

## 2023-05-24 ENCOUNTER — Other Ambulatory Visit (HOSPITAL_COMMUNITY)
Admission: RE | Admit: 2023-05-24 | Discharge: 2023-05-24 | Disposition: A | Discharge status: Home / Self Care | Payer: MEDICAID | Source: Ambulatory Visit | Attending: Women's Health | Admitting: Women's Health

## 2023-05-24 ENCOUNTER — Ambulatory Visit: Payer: MEDICAID | Admitting: Women's Health

## 2023-05-24 DIAGNOSIS — O0991 Supervision of high risk pregnancy, unspecified, first trimester: Secondary | ICD-10-CM | POA: Insufficient documentation

## 2023-05-24 DIAGNOSIS — F418 Other specified anxiety disorders: Secondary | ICD-10-CM

## 2023-05-24 DIAGNOSIS — O24911 Unspecified diabetes mellitus in pregnancy, first trimester: Secondary | ICD-10-CM | POA: Diagnosis not present

## 2023-05-24 DIAGNOSIS — Z8759 Personal history of other complications of pregnancy, childbirth and the puerperium: Secondary | ICD-10-CM

## 2023-05-24 DIAGNOSIS — B009 Herpesviral infection, unspecified: Secondary | ICD-10-CM

## 2023-05-24 DIAGNOSIS — Z3A13 13 weeks gestation of pregnancy: Secondary | ICD-10-CM | POA: Diagnosis not present

## 2023-05-24 DIAGNOSIS — Z1332 Encounter for screening for maternal depression: Secondary | ICD-10-CM

## 2023-05-24 DIAGNOSIS — Z124 Encounter for screening for malignant neoplasm of cervix: Secondary | ICD-10-CM

## 2023-05-24 DIAGNOSIS — O09899 Supervision of other high risk pregnancies, unspecified trimester: Secondary | ICD-10-CM

## 2023-05-24 DIAGNOSIS — O09891 Supervision of other high risk pregnancies, first trimester: Secondary | ICD-10-CM

## 2023-05-24 DIAGNOSIS — F1120 Opioid dependence, uncomplicated: Secondary | ICD-10-CM

## 2023-05-24 DIAGNOSIS — O99321 Drug use complicating pregnancy, first trimester: Secondary | ICD-10-CM

## 2023-05-24 MED ORDER — ASPIRIN 81 MG PO TBEC: 162.0000 mg | DELAYED_RELEASE_TABLET | Freq: Every day | ORAL | 2 refills | Status: DC

## 2023-05-24 MED ORDER — PANTOPRAZOLE SODIUM 20 MG PO TBEC: 20.0000 mg | DELAYED_RELEASE_TABLET | Freq: Every day | ORAL | 6 refills | Status: DC

## 2023-05-24 NOTE — Progress Notes (Signed)
 INITIAL OBSTETRICAL VISIT Patient name: Heather [name] MRN [id number]  Date of birth: 1995/07/25 Chief Complaint:   Initial Prenatal Visit (Nausea still bad)  History of Present Illness:   Heather [name] is a 28 y.o. G3P0111 Caucasian female at [redacted]w[redacted]d by US  at 7 weeks with an Estimated Date of Delivery: 11/24/23 being seen today for her initial obstetrical visit.   No LMP recorded (lmp unknown). Patient is pregnant. Her obstetrical history is significant for SAB x 1, 36.2wk PTB d/t PPROM, short cx noted @ 28w, GHTN, spinal headache w/ blood patch.   DM- dx @ 28yo, but not type 1, has nephropathy and neuropathy. Last eye exam >34yr ago, no retinopathy she is aware of. Currently on metformin  1000mg  BID, was on insulin , but has been off for about a month. Wants Dexcom On suboxone  (Ozan rx'd 5/28), h/o OUD Off of klonopin , gabapentin , insulin  x ~42mth, states HD wouldn't rx b/c of pregnancy Nausea still bad, has diclegis  and zofran , but only taking diclegis  2 at bedtime and thinks it may also be reflux- has pepcid but not helping- wants protonix  On zoloft  25mg  daily, doing well, ok w/ IBH referral  Last pap never. Results were: N/A     05/24/2023    2:24 PM 10/18/2016   11:25 AM 10/04/2016    2:43 PM 02/02/2016    9:45 AM  Depression screen PHQ 2/9  Decreased Interest 1 0 0 0  Down, Depressed, Hopeless 0 0 0 0  PHQ - 2 Score 1 0 0 0  Altered sleeping 1     Tired, decreased energy 3     Change in appetite 0     Feeling bad or failure about yourself  1     Trouble concentrating 1     Moving slowly or fidgety/restless 0     Suicidal thoughts 0     PHQ-9 Score 7           05/24/2023    2:25 PM  GAD 7 : Generalized Anxiety Score  Nervous, Anxious, on Edge 0  Control/stop worrying 0  Worry too much - different things 2  Trouble relaxing 1  Restless 0  Easily annoyed or irritable 3  Afraid - awful might happen 0  Total GAD 7 Score 6     Review of Systems:   Pertinent items are  noted in HPI Denies cramping/contractions, leakage of fluid, vaginal bleeding, abnormal vaginal discharge w/ itching/odor/irritation, headaches, visual changes, shortness of breath, chest pain, abdominal pain, severe nausea/vomiting, or problems with urination or bowel movements unless otherwise stated above.  Pertinent History Reviewed:  Reviewed past medical,surgical, social, obstetrical and family history.  Reviewed problem list, medications and allergies. OB History  Gravida Para Term Preterm AB Living  3 1  1 1 1   SAB IAB Ectopic Multiple Live Births     0 1    # Outcome Date GA Lbr Len/2nd Weight Sex Type Anes PTL Lv  3 Current           2 Preterm 09/05/15 [redacted]w[redacted]d 06:27 / 03:13 7 lb 7.6 oz (3.391 kg) F Vag-Spont EPI  LIV  1 AB      SAB      Physical Assessment:  There were no vitals filed for this visit.There is no height or weight on file to calculate BMI.       Physical Examination:  General appearance - well appearing, and in no distress  Mental status - alert, oriented to person, place,  and time  Psych:  She has a normal mood and affect  Skin - warm and dry, normal color, no suspicious lesions noted  Chest - effort normal, all lung fields clear to auscultation bilaterally  Heart - normal rate and regular rhythm  Abdomen - soft, nontender  Extremities:  No swelling or varicosities noted  Pelvic - VULVA: normal appearing vulva with no masses, tenderness or lesions  VAGINA: normal appearing vagina with normal color and discharge, no lesions  CERVIX: normal appearing cervix without discharge or lesions, no CMT  Thin prep pap is done w/ HR HPV cotesting  Chaperone: Latisha Cresenzo  TODAY'S FHR via dopper: +FHT  Assessment & Plan:  1) High-Risk Pregnancy [id number] at 110w5d with an Estimated Date of Delivery: 11/24/23   2) Initial OB visit  3) 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 Randolm Butte about restarting insulin . Check sugars QID (write down on log from dexcom), f/u 1wk w/ MD only. Schedule eye exam, will need fetal echo. Was on neurontin  800mg  TID before pregnancy   4) OUD on suboxone > rx'd previously by HD, Dr. Ozan rx'd 5/14, offered [facility], wants [facility]- info given  5) Nausea> take diclegis  2qhs, add 1in am, 1 in afternoon, has zofran , rx protonix  per request, if not improved let us  know  6) Dep/anx> on zoloft  25mg , doing well, IBH referral entered. Is on hydroxyzine . On klonopin  prior to pregnancy, discussed restarting next week if needed  7) H/O 36wk PPROM/PTB, short cx at 28wks> discuss w/ MD next visit if wants CL u/s  8) H/O GHTN> ASA, baseline labs  9) Cervical cancer screening> pap today  Meds:  Meds ordered this encounter  Medications   pantoprazole  (PROTONIX ) 20 MG tablet    Sig: Take 1 tablet (20 mg total) by mouth daily.    Dispense:  30 tablet    Refill:  6   aspirin  EC 81 MG tablet    Sig: Take 2 tablets (162 mg total) by mouth daily. Swallow whole.    Dispense:  180 tablet    Refill:  2    Initial labs obtained Continue prenatal vitamins Reviewed n/v relief measures and warning s/s to report Reviewed recommended weight gain based on pre-gravid BMI Encouraged well-balanced diet Genetic & carrier screening discussed: requests Panorama, NT/IT, and Horizon  Ultrasound discussed; fetal survey: requested CCNC completed> form faxed if has or is planning to apply for medicaid The nature of [employer] for [employer] with multiple MDs and other Advanced Practice Providers was explained to patient; also emphasized that fellows, residents, and students are part of our team.  Follow-up: Return for next 2d for NT if poss; 1wk for HROB MD only (entire pregnancy!).   Orders Placed This Encounter  Procedures   Urine Culture    GC/Chlamydia Probe Amp   CBC/D/Plt+RPR+Rh+ABO+RubIgG...   Hemoglobin A1c   PANORAMA PRENATAL TEST   Protein / creatinine ratio, urine   Comprehensive metabolic panel with GFR   TSH   Interpretation:   HORIZON CUSTOM   Amb ref to [facility]    Ferd Householder CNM, [facility] 05/24/23

## 2023-05-24 NOTE — Patient Instructions (Addendum)
 Heather [name], thank you for choosing our office today! We appreciate the opportunity to meet your healthcare needs. You may receive a short survey by mail, e-mail, or through [employer]. If you are happy with your care we would appreciate if you could take just a few minutes to complete the survey questions. We read all of your comments and take your feedback very seriously. Thank you again for choosing our office.  Center for [employer] Team at [facility]  [facility] at [facility] ([address] [place], [place] [address]) Entrance C, located off of [address] Free 24/7 valet parking   [place] BLOOD SUGARS Check blood sugars 4 times a day: in the morning before eating/drinking anything (goal is <95) and 2 hours after your first bite of breakfast, lunch, and supper (goal is <120). Please keep a log (Example 06/02/20: 95, 120, 120, 120) and bring to each appointment.    Nausea & Vomiting Have saltine crackers or pretzels by your bed and eat a few bites before you raise your head out of bed in the morning Eat small frequent meals throughout the day instead of large meals Drink plenty of fluids throughout the day to stay hydrated, just don't drink a lot of fluids with your meals.  This can make your stomach fill up faster making you feel sick Do not brush your teeth right after you eat Products with real ginger are good for nausea, like ginger ale and ginger hard candy Make sure it says made with real ginger! Sucking on sour candy like lemon heads is also good for nausea If your prenatal vitamins make you nauseated, take them at night so you will sleep through the nausea Sea Bands If you feel like you need medicine for the nausea & vomiting please let us  know If you are unable to keep any fluids or food down please let us  know   Constipation Drink plenty of fluid, preferably water, throughout the day Eat foods high in fiber such as fruits, vegetables, and grains Exercise, such  as walking, is a good way to keep your bowels regular Drink warm fluids, especially warm prune juice, or decaf coffee Eat a 1/2 cup of real oatmeal (not instant), 1/2 cup applesauce, and 1/2-1 cup warm prune juice every day If needed, you may take Colace (docusate sodium ) stool softener once or twice a day to help keep the stool soft.  If you still are having problems with constipation, you may take Miralax once daily as needed to help keep your bowels regular.   Home Blood Pressure Monitoring for Patients   Your provider has recommended that you check your blood pressure (BP) at least once a week at home. If you do not have a blood pressure cuff at home, one will be provided for you. Contact your provider if you have not received your monitor within 1 week.   Helpful Tips for Accurate Home Blood Pressure Checks  Don't smoke, exercise, or drink caffeine 30 minutes before checking your BP Use the restroom before checking your BP (a full bladder can raise your pressure) Relax in a comfortable upright chair Feet on the ground Left arm resting comfortably on a flat surface at the level of your heart Legs uncrossed Back supported Sit quietly and don't talk Place the cuff on your bare arm Adjust snuggly, so that only two fingertips can fit between your skin and the top of the cuff Check 2 readings separated by at least one minute  Keep a log of your BP readings For a visual, please reference this diagram: http://ccnc.care/bpdiagram  Provider Name: [facility]     Phone: [phone]  Zone 1: ALL CLEAR  Continue to monitor your symptoms:  BP reading is less than 140 (top number) or less than 90 (bottom number)  No right upper stomach pain No headaches or seeing spots No feeling nauseated or throwing up No swelling in face and hands  Zone 2: CAUTION Call your doctor's office for any of the following:  BP reading is greater than 140 (top number) or greater than 90 (bottom number)   Stomach pain under your ribs in the middle or right side Headaches or seeing spots Feeling nauseated or throwing up Swelling in face and hands  Zone 3: EMERGENCY  Seek immediate medical care if you have any of the following:  BP reading is greater than160 (top number) or greater than 110 (bottom number) Severe headaches not improving with Tylenol  Serious difficulty catching your breath Any worsening symptoms from Zone 2    First Trimester of Pregnancy The first trimester of pregnancy is from week 1 until the end of week 12 (months 1 through 3). A week after a sperm fertilizes an egg, the egg will implant on the wall of the uterus. This embryo will begin to develop into a baby. Genes from you and your partner are forming the baby. The female genes determine whether the baby is a boy or a girl. At 6-8 weeks, the eyes and face are formed, and the heartbeat can be seen on ultrasound. At the end of 12 weeks, all the baby's organs are formed.  Now that you are pregnant, you will want to do everything you can to have a healthy baby. Two of the most important things are to get good prenatal care and to follow your health care provider's instructions. Prenatal care is all the medical care you receive before the baby's birth. This care will help prevent, find, and treat any problems during the pregnancy and childbirth. BODY CHANGES Your body goes through many changes during pregnancy. The changes vary from woman to woman.  You may gain or lose a couple of pounds at first. You may feel sick to your stomach (nauseous) and throw up (vomit). If the vomiting is uncontrollable, call your health care provider. You may tire easily. You may develop headaches that can be relieved by medicines approved by your health care provider. You may urinate more often. Painful urination may mean you have a bladder infection. You may develop heartburn as a result of your pregnancy. You may develop constipation because  certain hormones are causing the muscles that push waste through your intestines to slow down. You may develop hemorrhoids or swollen, bulging veins (varicose veins). Your breasts may begin to grow larger and become tender. Your nipples may stick out more, and the tissue that surrounds them (areola) may become darker. Your gums may bleed and may be sensitive to brushing and flossing. Dark spots or blotches (chloasma, mask of pregnancy) may develop on your face. This will likely fade after the baby is born. Your menstrual periods will stop. You may have a loss of appetite. You may develop cravings for certain kinds of food. You may have changes in your emotions from day to day, such as being excited to be pregnant or being concerned that something may go wrong with the pregnancy and baby. You may have more vivid and strange dreams. You may have changes  in your hair. These can include thickening of your hair, rapid growth, and changes in texture. Some women also have hair loss during or after pregnancy, or hair that feels dry or thin. Your hair will most likely return to normal after your baby is born. WHAT TO EXPECT AT YOUR PRENATAL VISITS During a routine prenatal visit: You will be weighed to make sure you and the baby are growing normally. Your blood pressure will be taken. Your abdomen will be measured to track your baby's growth. The fetal heartbeat will be listened to starting around week 10 or 12 of your pregnancy. Test results from any previous visits will be discussed. Your health care provider may ask you: How you are feeling. If you are feeling the baby move. If you have had any abnormal symptoms, such as leaking fluid, bleeding, severe headaches, or abdominal cramping. If you have any questions. Other tests that may be performed during your first trimester include: Blood tests to find your blood type and to check for the presence of any previous infections. They will also be used  to check for low iron  levels (anemia) and Rh antibodies. Later in the pregnancy, blood tests for diabetes will be done along with other tests if problems develop. Urine tests to check for infections, diabetes, or protein in the urine. An ultrasound to confirm the proper growth and development of the baby. An amniocentesis to check for possible genetic problems. Fetal screens for spina bifida and Down syndrome. You may need other tests to make sure you and the baby are doing well. HOME CARE INSTRUCTIONS  Medicines Follow your health care provider's instructions regarding medicine use. Specific medicines may be either safe or unsafe to take during pregnancy. Take your prenatal vitamins as directed. If you develop constipation, try taking a stool softener if your health care provider approves. Diet Eat regular, well-balanced meals. Choose a variety of foods, such as meat or vegetable-based protein, fish, milk and low-fat dairy products, vegetables, fruits, and whole grain breads and cereals. Your health care provider will help you determine the amount of weight gain that is right for you. Avoid raw meat and uncooked cheese. These carry germs that can cause birth defects in the baby. Eating four or five small meals rather than three large meals a day may help relieve nausea and vomiting. If you start to feel nauseous, eating a few soda crackers can be helpful. Drinking liquids between meals instead of during meals also seems to help nausea and vomiting. If you develop constipation, eat more high-fiber foods, such as fresh vegetables or fruit and whole grains. Drink enough fluids to keep your urine clear or pale yellow. Activity and Exercise Exercise only as directed by your health care provider. Exercising will help you: Control your weight. Stay in shape. Be prepared for labor and delivery. Experiencing pain or cramping in the lower abdomen or low back is a good sign that you should stop  exercising. Check with your health care provider before continuing normal exercises. Try to avoid standing for long periods of time. Move your legs often if you must stand in one place for a long time. Avoid heavy lifting. Wear low-heeled shoes, and practice good posture. You may continue to have sex unless your health care provider directs you otherwise. Relief of Pain or Discomfort Wear a good support bra for breast tenderness.   Take warm sitz baths to soothe any pain or discomfort caused by hemorrhoids. Use hemorrhoid cream if your health care  provider approves.   Rest with your legs elevated if you have leg cramps or low back pain. If you develop varicose veins in your legs, wear support hose. Elevate your feet for 15 minutes, 3-4 times a day. Limit salt in your diet. Prenatal Care Schedule your prenatal visits by the twelfth week of pregnancy. They are usually scheduled monthly at first, then more often in the last 2 months before delivery. Write down your questions. Take them to your prenatal visits. Keep all your prenatal visits as directed by your health care provider. Safety Wear your seat belt at all times when driving. Make a list of emergency phone numbers, including numbers for family, friends, the hospital, and police and fire departments. General Tips Ask your health care provider for a referral to a local prenatal education class. Begin classes no later than at the beginning of month 6 of your pregnancy. Ask for help if you have counseling or nutritional needs during pregnancy. Your health care provider can offer advice or refer you to specialists for help with various needs. Do not use hot tubs, steam rooms, or saunas. Do not douche or use tampons or scented sanitary pads. Do not cross your legs for long periods of time. Avoid cat litter boxes and soil used by cats. These carry germs that can cause birth defects in the baby and possibly loss of the fetus by miscarriage or  stillbirth. Avoid all smoking, herbs, alcohol, and medicines not prescribed by your health care provider. Chemicals in these affect the formation and growth of the baby. Schedule a dentist appointment. At home, brush your teeth with a soft toothbrush and be gentle when you floss. SEEK MEDICAL CARE IF:  You have dizziness. You have mild pelvic cramps, pelvic pressure, or nagging pain in the abdominal area. You have persistent nausea, vomiting, or diarrhea. You have a bad smelling vaginal discharge. You have pain with urination. You notice increased swelling in your face, hands, legs, or ankles. SEEK IMMEDIATE MEDICAL CARE IF:  You have a fever. You are leaking fluid from your vagina. You have spotting or bleeding from your vagina. You have severe abdominal cramping or pain. You have rapid weight gain or loss. You vomit blood or material that looks like coffee grounds. You are exposed to [place] measles and have never had them. You are exposed to fifth disease or chickenpox. You develop a severe headache. You have shortness of breath. You have any kind of trauma, such as from a fall or a car accident. Document Released: 12/15/2000 Document Revised: 05/07/2013 Document Reviewed: 10/31/2012 [facility] Patient Information 2015 [place], [place]. This information is not intended to replace advice given to you by your health care provider. Make sure you discuss any questions you have with your health care provider.

## 2023-05-25 ENCOUNTER — Encounter: Payer: Self-pay | Admitting: Women's Health

## 2023-05-25 ENCOUNTER — Other Ambulatory Visit: Payer: MEDICAID

## 2023-05-25 DIAGNOSIS — F1120 Opioid dependence, uncomplicated: Secondary | ICD-10-CM | POA: Insufficient documentation

## 2023-05-25 DIAGNOSIS — E1140 Type 2 diabetes mellitus with diabetic neuropathy, unspecified: Secondary | ICD-10-CM | POA: Insufficient documentation

## 2023-05-25 DIAGNOSIS — B009 Herpesviral infection, unspecified: Secondary | ICD-10-CM | POA: Insufficient documentation

## 2023-05-26 ENCOUNTER — Encounter: Payer: Self-pay | Admitting: *Deleted

## 2023-05-26 ENCOUNTER — Ambulatory Visit: Payer: Self-pay | Admitting: Women's Health

## 2023-05-26 ENCOUNTER — Other Ambulatory Visit: Payer: Self-pay | Admitting: Women's Health

## 2023-05-26 DIAGNOSIS — Z8759 Personal history of other complications of pregnancy, childbirth and the puerperium: Secondary | ICD-10-CM

## 2023-05-26 DIAGNOSIS — E119 Type 2 diabetes mellitus without complications: Secondary | ICD-10-CM

## 2023-05-26 DIAGNOSIS — Z794 Long term (current) use of insulin: Secondary | ICD-10-CM

## 2023-05-26 LAB — CBC/D/PLT+RPR+RH+ABO+RUBIGG...
Antibody Screen: NEGATIVE
Basophils Absolute: 0 10*3/uL (ref 0.0–0.2)
Basos: 0 %
EOS (ABSOLUTE): 0 10*3/uL (ref 0.0–0.4)
Eos: 0 %
HCV Ab: NONREACTIVE
HIV Screen 4th Generation wRfx: NONREACTIVE
Hematocrit: 34.5 % (ref 34.0–46.6)
Hemoglobin: 11.6 g/dL (ref 11.1–15.9)
Hepatitis B Surface Ag: NEGATIVE
Immature Grans (Abs): 0 10*3/uL (ref 0.0–0.1)
Immature Granulocytes: 0 %
Lymphocytes Absolute: 1.5 10*3/uL (ref 0.7–3.1)
Lymphs: 28 %
MCH: 29.6 pg (ref 26.6–33.0)
MCHC: 33.6 g/dL (ref 31.5–35.7)
MCV: 88 fL (ref 79–97)
Monocytes Absolute: 0.4 10*3/uL (ref 0.1–0.9)
Monocytes: 8 %
Neutrophils Absolute: 3.4 10*3/uL (ref 1.4–7.0)
Neutrophils: 64 %
Platelets: 167 10*3/uL (ref 150–450)
RBC: 3.92 x10E6/uL (ref 3.77–5.28)
RDW: 13 % (ref 11.7–15.4)
RPR Ser Ql: NONREACTIVE
Rh Factor: POSITIVE
Rubella Antibodies, IGG: 12.1 {index} (ref >0.99)
WBC: 5.4 10*3/uL (ref 3.4–10.8)

## 2023-05-26 LAB — COMPREHENSIVE METABOLIC PANEL WITH GFR
ALT: 10 IU/L (ref 0–32)
AST: 11 IU/L (ref 0–40)
Albumin: 4.4 g/dL (ref 4.0–5.0)
Alkaline Phosphatase: 62 IU/L (ref 44–121)
BUN/Creatinine Ratio: 12 (ref 9–23)
BUN: 5 mg/dL — ABNORMAL LOW (ref 6–20)
Bilirubin Total: <0.2 mg/dL (ref 0.0–1.2)
CO2: 21 mmol/L (ref 20–29)
Calcium: 8.8 mg/dL (ref 8.7–10.2)
Chloride: 102 mmol/L (ref 96–106)
Creatinine, Ser: 0.43 mg/dL — ABNORMAL LOW (ref 0.57–1.00)
Globulin, Total: 2.5 g/dL (ref 1.5–4.5)
Glucose: 117 mg/dL — ABNORMAL HIGH (ref 70–99)
Potassium: 3.8 mmol/L (ref 3.5–5.2)
Sodium: 137 mmol/L (ref 134–144)
Total Protein: 6.9 g/dL (ref 6.0–8.5)
eGFR: 137 mL/min/{1.73_m2} (ref >59)

## 2023-05-26 LAB — PROTEIN / CREATININE RATIO, URINE
Creatinine, Urine: 57.7 mg/dL
Protein, Ur: 5 mg/dL
Protein/Creat Ratio: 87 mg/g{creat} (ref 0–200)

## 2023-05-26 LAB — HCV INTERPRETATION

## 2023-05-26 LAB — HEMOGLOBIN A1C
Est. average glucose Bld gHb Est-mCnc: 160 mg/dL
Hgb A1c MFr Bld: 7.2 % — ABNORMAL HIGH (ref 4.8–5.6)

## 2023-05-26 LAB — URINE CULTURE

## 2023-05-26 LAB — TSH: TSH: 1.47 u[IU]/mL (ref 0.450–4.500)

## 2023-05-26 LAB — GC/CHLAMYDIA PROBE AMP
Chlamydia trachomatis, NAA: NEGATIVE
Neisseria Gonorrhoeae by PCR: NEGATIVE

## 2023-05-26 MED ORDER — DEXCOM G7 RECEIVER DEVI: 0 refills | Status: DC

## 2023-05-26 MED ORDER — DEXCOM G7 SENSOR MISC
12 refills | Status: DC
Start: 2023-05-26 — End: 2023-06-01

## 2023-05-26 MED ORDER — GABAPENTIN 800 MG PO TABS: 800.0000 mg | ORAL_TABLET | Freq: Three times a day (TID) | ORAL | 6 refills | Status: DC

## 2023-05-26 MED ORDER — CLONAZEPAM 1 MG PO TABS: 1.0000 mg | ORAL_TABLET | Freq: Three times a day (TID) | ORAL | 2 refills | Status: DC

## 2023-05-26 NOTE — Addendum Note (Signed)
 Addended by: Myrl Askew on: 05/26/2023 04:36 PM   Modules accepted: Orders

## 2023-05-27 LAB — CYTOLOGY - PAP
Comment: NEGATIVE
Comment: NEGATIVE
Comment: NEGATIVE
Diagnosis: HIGH — AB
HPV 16: NEGATIVE
HPV 18 / 45: NEGATIVE
High risk HPV: POSITIVE — AB

## 2023-05-29 ENCOUNTER — Inpatient Hospital Stay (HOSPITAL_COMMUNITY)
Admission: AD | Admit: 2023-05-29 | Discharge: 2023-05-30 | Disposition: A | Discharge status: Home / Self Care | Payer: MEDICAID | Attending: Family Medicine | Admitting: Family Medicine

## 2023-05-29 DIAGNOSIS — Z3A14 14 weeks gestation of pregnancy: Secondary | ICD-10-CM

## 2023-05-29 DIAGNOSIS — R109 Unspecified abdominal pain: Secondary | ICD-10-CM | POA: Diagnosis present

## 2023-05-29 DIAGNOSIS — O4693 Antepartum hemorrhage, unspecified, third trimester: Secondary | ICD-10-CM | POA: Diagnosis not present

## 2023-05-29 DIAGNOSIS — O24112 Pre-existing diabetes mellitus, type 2, in pregnancy, second trimester: Secondary | ICD-10-CM | POA: Insufficient documentation

## 2023-05-29 DIAGNOSIS — O99352 Diseases of the nervous system complicating pregnancy, second trimester: Secondary | ICD-10-CM | POA: Diagnosis not present

## 2023-05-29 DIAGNOSIS — O09212 Supervision of pregnancy with history of pre-term labor, second trimester: Secondary | ICD-10-CM | POA: Insufficient documentation

## 2023-05-29 DIAGNOSIS — Z3A36 36 weeks gestation of pregnancy: Secondary | ICD-10-CM | POA: Diagnosis not present

## 2023-05-29 DIAGNOSIS — O4692 Antepartum hemorrhage, unspecified, second trimester: Secondary | ICD-10-CM

## 2023-05-29 DIAGNOSIS — W108XXA Fall (on) (from) other stairs and steps, initial encounter: Secondary | ICD-10-CM

## 2023-05-29 DIAGNOSIS — W109XXA Fall (on) (from) unspecified stairs and steps, initial encounter: Secondary | ICD-10-CM | POA: Insufficient documentation

## 2023-05-29 DIAGNOSIS — Z7984 Long term (current) use of oral hypoglycemic drugs: Secondary | ICD-10-CM | POA: Insufficient documentation

## 2023-05-29 DIAGNOSIS — O99212 Obesity complicating pregnancy, second trimester: Secondary | ICD-10-CM | POA: Insufficient documentation

## 2023-05-29 DIAGNOSIS — O9A212 Injury, poisoning and certain other consequences of external causes complicating pregnancy, second trimester: Secondary | ICD-10-CM | POA: Insufficient documentation

## 2023-05-29 DIAGNOSIS — O26892 Other specified pregnancy related conditions, second trimester: Secondary | ICD-10-CM | POA: Diagnosis not present

## 2023-05-29 LAB — URINALYSIS, ROUTINE W REFLEX MICROSCOPIC
Bilirubin Urine: NEGATIVE
Glucose, UA: NEGATIVE mg/dL
Hgb urine dipstick: NEGATIVE
Ketones, ur: NEGATIVE mg/dL
Nitrite: NEGATIVE
Protein, ur: NEGATIVE mg/dL
Specific Gravity, Urine: 1.012 (ref 1.005–1.030)
pH: 7 (ref 5.0–8.0)

## 2023-05-29 MED ORDER — ACETAMINOPHEN 500 MG PO TABS
1000.0000 mg | ORAL_TABLET | Freq: Once | ORAL | Status: AC
  Administered 2023-05-29: 1000 mg via ORAL
  Filled 2023-05-29: qty 2

## 2023-05-29 NOTE — MAU Provider Note (Signed)
 History     CSN: [id number]  Arrival date and time: 05/29/23 2148   Event Date/Time   First Provider Initiated Contact with Patient 05/29/23 2240      No chief complaint on file.   Heather [name] is a 28 y.o. [id number] at [redacted]w[redacted]d who receives care at [facility].  She presents today,via EMS, for VB after fall.  She states she fell down a flight of stairs "face first" after tripping over her shoe.  She states this occurred around 2000.  She reports she went to urinate and noted thick "bloody mucousy discharge."  She states the pain started shortly after and is "bad." She reports the pain feels "like very bad period cramps."  She rates the pain 7/10 and did not take anything for the pain.  She states the pain is improved with sitting, but worse with standing. She states the pain is located at the top of her abdomen and is sharp, but she is also have vaginal pressure that radiates to her buttocks.  She states these are all new symptoms.   OB History     Gravida  3   Para  1   Term      Preterm  1   AB  1   Living  1      SAB      IAB      Ectopic      Multiple  0   Live Births  1           Past Medical History:  Diagnosis Date   ADHD (attention deficit hyperactivity disorder)    Anemia    Asthma    as a child   Bipolar 1 disorder (HCC)    Diabetes mellitus    type 2   Diabetic neuropathy (HCC)    Fatty liver    GHTN vs. CHTN 06/11/2015   Hidradenitis 06/05/2012   History of hiatal hernia    Irregular bleeding 06/05/2012   Mononucleosis 01/2012   Nausea and vomiting 01/21/2015   Obesity (BMI 30-39.9)    Other and unspecified ovarian cyst 07/24/2012   Right  Ovarian cyst 8 cm on US  7/15 in ER   Pneumonia    Pregnant 01/21/2015   Schizo affective schizophrenia (HCC)    Seizures (HCC)    febrile seizures as a child   Sleep apnea    no cpap    Past Surgical History:  Procedure Laterality Date   ADENOIDECTOMY     ANTERIOR CRUCIATE LIGAMENT REPAIR      INCISION AND DRAINAGE ABSCESS     Recurrent abscesses of labia and groin area with I and D in office and ER   TONSILLECTOMY     WISDOM TOOTH EXTRACTION      Family History  Problem Relation Age of Onset   Diabetes Mother    Hypertension Mother    Bipolar disorder Father    Asthma Sister        intrinsic   Bipolar disorder Brother    Anxiety disorder Brother    Stroke Maternal Grandmother    Cancer Other        colon, lung, brain-paternal side    Social History   Tobacco Use   Smoking status: Never   Smokeless tobacco: Never  Vaping Use   Vaping status: Never Used  Substance Use Topics   Alcohol use: No   Drug use: Not Currently    Types: Oxycodone , Hydrocodone     Allergies:  Allergies  Allergen Reactions   Prozac [Fluoxetine Hcl]     Stiffness in neck, "stroke like" symptoms   Cephalosporins Other (See Comments)    Unknown childhood reaction   Latuda [Lurasidone Hcl]     Tongue, confusion     Medications Prior to Admission  Medication Sig Dispense Refill Last Dose/Taking   Accu-Chek Softclix Lancets lancets Use as instructed to check blood sugar 4 times daily 100 each 12    aspirin  EC 81 MG tablet Take 2 tablets (162 mg total) by mouth daily. Swallow whole. 180 tablet 2    Blood Glucose Monitoring Suppl (ACCU-CHEK GUIDE ME) w/Device KIT 1 each by Does not apply route 4 (four) times daily. 1 kit 0    Blood Pressure Monitor MISC For regular home bp monitoring during pregnancy (Patient not taking: Reported on 05/24/2023) 1 each 0    Buprenorphine  HCl-Naloxone  HCl (SUBOXONE ) 8-2 MG FILM Place 1 Film inside cheek 3 (three) times daily for 14 days. 42 Film 0    clonazePAM  (KLONOPIN ) 1 MG tablet Take 1 tablet (1 mg total) by mouth 3 (three) times daily. 30 tablet 2    Continuous Glucose Receiver (DEXCOM G7 RECEIVER) DEVI Use per manufacturers instructions 1 each 0    Continuous Glucose Sensor (DEXCOM G7 SENSOR) MISC Use per manufacturers instructions 3 each 12     Doxylamine -Pyridoxine  (DICLEGIS ) 10-10 MG TBEC Take 2 tablets by mouth at bedtime. 60 tablet 3    gabapentin  (NEURONTIN ) 800 MG tablet Take 1 tablet (800 mg total) by mouth 3 (three) times daily. 90 tablet 6    glucose blood test strip Use as instructed to check blood sugar four times daily (Patient not taking: Reported on 05/24/2023) 100 each 12    hydrOXYzine  (ATARAX ) 10 MG tablet Take 1 tablet (10 mg total) by mouth 3 (three) times daily as needed. 30 tablet 6    insulin  aspart (NOVOLOG ) 100 UNIT/ML injection Inject 25-40 Units into the skin 3 (three) times daily before meals. Based on sliding scale starting at 25 units for levels between 150-200. Add 5 units as directed (Patient not taking: Reported on 03/28/2023)      metFORMIN  (GLUCOPHAGE -XR) 500 MG 24 hr tablet Take 2 tablets (1,000 mg total) by mouth 2 (two) times daily with a meal. 120 tablet 11    ondansetron  (ZOFRAN -ODT) 4 MG disintegrating tablet Take 1 tablet (4 mg total) by mouth every 8 (eight) hours as needed for nausea or vomiting. 20 tablet 1    pantoprazole  (PROTONIX ) 20 MG tablet Take 1 tablet (20 mg total) by mouth daily. 30 tablet 6    Prenatal Vit-Fe Fumarate-FA (PRENATAL VITAMIN PLUS LOW IRON ) 27-1 MG TABS Take 1 tablet by mouth daily at 6 (six) AM. 90 tablet 4    sertraline  (ZOLOFT ) 25 MG tablet Take 1 tablet (25 mg total) by mouth daily. 30 tablet 0    triamcinolone  ointment (KENALOG ) 0.5 % Apply 1 Application topically 2 (two) times daily. (Patient not taking: Reported on 05/24/2023) 30 g 0    valACYclovir  (VALTREX ) 1000 MG tablet Take 1 tablet (1,000 mg total) by mouth 2 (two) times daily. (Patient not taking: Reported on 03/28/2023) 20 tablet 2     Review of Systems  Gastrointestinal:  Positive for abdominal pain. Negative for nausea and vomiting.  Genitourinary:  Positive for vaginal bleeding and vaginal discharge. Negative for difficulty urinating and dysuria.  Musculoskeletal:  Positive for back pain.   Physical Exam    Blood pressure 132/71, pulse (!) 109,  temperature 98.5 F (36.9 C), temperature source Oral, resp. rate 20, height 5\' 5"  (1.651 m), weight 95 kg, SpO2 100%.  Physical Exam Constitutional:      Appearance: Normal appearance.  HENT:     Head: Normocephalic and atraumatic.  Eyes:     Conjunctiva/sclera: Conjunctivae normal.  Cardiovascular:     Rate and Rhythm: Normal rate.  Pulmonary:     Effort: Pulmonary effort is normal. No respiratory distress.  Abdominal:     Palpations: Abdomen is soft.     Tenderness: There is abdominal tenderness.  Musculoskeletal:        General: Normal range of motion.     Cervical back: Normal range of motion.  Skin:    General: Skin is warm and dry.  Neurological:     Mental Status: She is alert and oriented to person, place, and time.  Psychiatric:        Mood and Affect: Mood normal.        Behavior: Behavior normal.     MAU Course  Procedures Results for orders placed or performed during the hospital encounter of 05/29/23 (from the past 24 hours)  Urinalysis, Routine w reflex microscopic -Urine, Clean Catch     Status: Abnormal   Collection Time: 05/29/23 10:23 PM  Result Value Ref Range   Color, Urine YELLOW YELLOW   APPearance HAZY (A) CLEAR   Specific Gravity, Urine 1.012 1.005 - 1.030   pH 7.0 5.0 - 8.0   Glucose, UA NEGATIVE NEGATIVE mg/dL   Hgb urine dipstick NEGATIVE NEGATIVE   Bilirubin Urine NEGATIVE NEGATIVE   Ketones, ur NEGATIVE NEGATIVE mg/dL   Protein, ur NEGATIVE NEGATIVE mg/dL   Nitrite NEGATIVE NEGATIVE   Leukocytes,Ua MODERATE (A) NEGATIVE   RBC / HPF 0-5 0 - 5 RBC/hpf   WBC, UA 0-5 0 - 5 WBC/hpf   Bacteria, UA RARE (A) NONE SEEN   Squamous Epithelial / HPF 6-10 0 - 5 /HPF    MDM Exam Labs: UA Analgesic Ultrasound Assessment and Plan  28 year old, [id number]  SIUP at 14.4 weeks S/P Fall from Stairs Vaginal Discharge vs Bleeding  -Reviewed POC with patient. -Patient declines speculum exam or STD testing.   -Discussed medication for c/o pain and patient agreeable. -Give tylenol . -Send for US  to assess placenta.   Kraig Peru 05/29/2023, 10:40 PM   Reassessment (1:02 AM) -Preliminary report returns with questionable [facility].  -Patient reports continued pain and reassured that aches and pains expected after a fall.  -Discussed rest and hydration as well as continued usage of tylenol  as needed. -Bleeding precautions reviewed. -Encouraged to call primary office or return to MAU if symptoms worsen or with the onset of new symptoms. -Discharged to home in stable condition.  Kraig Peru MSN, CNM Advanced Practice Provider, Center for [employer]

## 2023-05-29 NOTE — MAU Note (Signed)
..  Heather [name] is a 28 y.o. at [redacted]w[redacted]d here in MAU via EMS: had a fall around 2000, were she fell face first down the step and landed on her abdomen. Shortly after fall had vaginal bleeding, reports that the bleeding was thick mucous-like.  Is having lower abdominal pressure and it radiates to her back.  Is having upper left abdominal pain that began right after the fall. The pain is worse when she stands up.   Pain score: 8/10 Vitals:   05/29/23 2213  BP: 132/71  Pulse: (!) 109  Resp: 20  Temp: 98.5 F (36.9 C)  SpO2: 100%     FHT:160 Lab orders placed from triage:  UA

## 2023-05-30 ENCOUNTER — Inpatient Hospital Stay (HOSPITAL_BASED_OUTPATIENT_CLINIC_OR_DEPARTMENT_OTHER): Payer: MEDICAID

## 2023-05-30 DIAGNOSIS — O26892 Other specified pregnancy related conditions, second trimester: Secondary | ICD-10-CM

## 2023-05-30 DIAGNOSIS — O09212 Supervision of pregnancy with history of pre-term labor, second trimester: Secondary | ICD-10-CM | POA: Diagnosis not present

## 2023-05-30 DIAGNOSIS — O99322 Drug use complicating pregnancy, second trimester: Secondary | ICD-10-CM

## 2023-05-30 DIAGNOSIS — O24112 Pre-existing diabetes mellitus, type 2, in pregnancy, second trimester: Secondary | ICD-10-CM | POA: Diagnosis not present

## 2023-05-30 DIAGNOSIS — O4692 Antepartum hemorrhage, unspecified, second trimester: Secondary | ICD-10-CM

## 2023-05-30 DIAGNOSIS — E119 Type 2 diabetes mellitus without complications: Secondary | ICD-10-CM | POA: Diagnosis not present

## 2023-05-30 DIAGNOSIS — Z3A14 14 weeks gestation of pregnancy: Secondary | ICD-10-CM

## 2023-05-30 DIAGNOSIS — W108XXA Fall (on) (from) other stairs and steps, initial encounter: Secondary | ICD-10-CM

## 2023-05-30 LAB — PANORAMA PRENATAL TEST FULL PANEL:PANORAMA TEST PLUS 5 ADDITIONAL MICRODELETIONS: FETAL FRACTION: 6.9

## 2023-05-31 ENCOUNTER — Ambulatory Visit: Payer: MEDICAID | Admitting: Obstetrics & Gynecology

## 2023-05-31 ENCOUNTER — Encounter: Payer: Self-pay | Admitting: Obstetrics & Gynecology

## 2023-05-31 VITALS — BP 128/64 | Wt 209.0 lb

## 2023-05-31 DIAGNOSIS — O24319 Unspecified pre-existing diabetes mellitus in pregnancy, unspecified trimester: Secondary | ICD-10-CM

## 2023-05-31 DIAGNOSIS — O0992 Supervision of high risk pregnancy, unspecified, second trimester: Secondary | ICD-10-CM | POA: Diagnosis not present

## 2023-05-31 DIAGNOSIS — Z3A14 14 weeks gestation of pregnancy: Secondary | ICD-10-CM

## 2023-05-31 DIAGNOSIS — O24312 Unspecified pre-existing diabetes mellitus in pregnancy, second trimester: Secondary | ICD-10-CM

## 2023-05-31 DIAGNOSIS — O0991 Supervision of high risk pregnancy, unspecified, first trimester: Secondary | ICD-10-CM

## 2023-05-31 DIAGNOSIS — R87619 Unspecified abnormal cytological findings in specimens from cervix uteri: Secondary | ICD-10-CM | POA: Insufficient documentation

## 2023-05-31 LAB — HORIZON CUSTOM: REPORT SUMMARY: NEGATIVE

## 2023-05-31 MED ORDER — OMEPRAZOLE 20 MG PO CPDR: 20.0000 mg | DELAYED_RELEASE_CAPSULE | Freq: Every day | ORAL | 6 refills | Status: DC

## 2023-05-31 MED ORDER — BUPRENORPHINE HCL 8 MG SL SUBL: 8.0000 mg | SUBLINGUAL_TABLET | Freq: Every day | SUBLINGUAL | 0 refills | Status: DC

## 2023-05-31 NOTE — Progress Notes (Signed)
 HIGH-RISK PREGNANCY VISIT Patient name: Heather [name] MRN [id number]  Date of birth: 10-10-1995 Chief Complaint:   Routine Prenatal Visit  History of Present Illness:   Heather [name] is a 28 y.o. 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    Today she reports no complaints. Contractions: Irritability. Vag. Bleeding: None.   . denies leaking of fluid.      05/24/2023    2:24 PM 10/18/2016   11:25 AM 10/04/2016    2:43 PM 02/02/2016    9:45 AM  Depression screen PHQ 2/9  Decreased Interest 1 0 0 0  Down, Depressed, Hopeless 0 0 0 0  PHQ - 2 Score 1 0 0 0  Altered sleeping 1     Tired, decreased energy 3     Change in appetite 0     Feeling bad or failure about yourself  1     Trouble concentrating 1     Moving slowly or fidgety/restless 0     Suicidal thoughts 0     PHQ-9 Score 7           05/24/2023    2:25 PM  GAD 7 : Generalized Anxiety Score  Nervous, Anxious, on Edge 0  Control/stop worrying 0  Worry too much - different things 2  Trouble relaxing 1  Restless 0  Easily annoyed or irritable 3  Afraid - awful might happen 0  Total GAD 7 Score 6     Review of Systems:   Pertinent items are noted in HPI Denies abnormal vaginal discharge w/ itching/odor/irritation, headaches, visual changes, shortness of breath, chest pain, abdominal pain, severe nausea/vomiting, or problems with urination or bowel movements unless otherwise stated above. Pertinent History Reviewed:  Reviewed past medical,surgical, social, obstetrical and family history.  Reviewed problem list, medications and allergies. Physical Assessment:   Vitals:   05/31/23 1615 05/31/23 1616  BP: (!) 144/85 128/64  Weight: 209 lb (94.8 kg)   Body mass index is 34.78 kg/m.           Physical Examination:   General appearance: alert, well  appearing, and in no distress  Mental status: alert, oriented to person, place, and time  Skin: warm & dry   Extremities:      Cardiovascular: normal heart rate noted  Respiratory: normal respiratory effort, no distress  Abdomen: gravid, soft, non-tender  Pelvic: Cervical exam deferred         Fetal Status:          Fetal Surveillance Testing today: FHR 150   Chaperone: Wendell Halt RN    No results found for this or any previous visit (from the past 24 hours).  Assessment & Plan:  High-risk pregnancy: [id number] at [redacted]w[redacted]d with an Estimated Date of Delivery: 11/24/23      ICD-10-CM   1. Supervision of high risk pregnancy in first trimester  O09.91     2. [redacted] weeks gestation of pregnancy  Z3A.14     3. Class D DM: metformin  1000 mg BID  O24.319         Meds:  Meds ordered this encounter  Medications   buprenorphine  (SUBUTEX ) 8 MG SUBL SL tablet    Sig: Place 1 tablet (8 mg total) under the tongue daily.    Dispense:  42 tablet    Refill:  0   omeprazole  (PRILOSEC) 20 MG capsule    Sig: Take 1 capsule (20 mg total) by mouth daily. 1 tablet a day    Dispense:  30 capsule    Refill:  6    Orders: No orders of the defined types were placed in this encounter.      Treatment Plan:  sonogram 4 weeks    Follow-up: Return in about 4 weeks (around 06/28/2023) for 20 week sono, HROB.   No future appointments.  No orders of the defined types were placed in this encounter.  Wendelyn Halter  Attending Physician for the Center for [facility] 05/31/2023 4:46 PM

## 2023-06-01 ENCOUNTER — Other Ambulatory Visit: Payer: Self-pay | Admitting: Women's Health

## 2023-06-01 DIAGNOSIS — E1140 Type 2 diabetes mellitus with diabetic neuropathy, unspecified: Secondary | ICD-10-CM

## 2023-06-01 MED ORDER — FREESTYLE LIBRE 3 READER DEVI: 0 refills | Status: DC

## 2023-06-01 MED ORDER — FREESTYLE LIBRE 3 SENSOR MISC: 12 refills | Status: DC

## 2023-06-07 ENCOUNTER — Other Ambulatory Visit: Payer: Self-pay | Admitting: Women's Health

## 2023-06-07 ENCOUNTER — Telehealth: Payer: Self-pay | Admitting: *Deleted

## 2023-06-07 MED ORDER — PROMETHAZINE HCL 25 MG PO TABS: 12.5000 mg | ORAL_TABLET | Freq: Four times a day (QID) | ORAL | 6 refills | Status: DC | PRN

## 2023-06-07 NOTE — Telephone Encounter (Signed)
 Patient called stating she fell a few days ago and the pain in her back was fine but has recently started to hurt worse.  States she has had some nausea and vomiting and is taking Diclegis  and Zofran  with no relief from either. Requesting a different nausea medication.  Informed will see if Phenergan  can be sent in but advised medication may cause drowsiness. Patient verbalized understanding. Advised to check back with her pharmacy later today.

## 2023-06-10 ENCOUNTER — Other Ambulatory Visit: Payer: Self-pay | Admitting: Obstetrics & Gynecology

## 2023-06-12 ENCOUNTER — Other Ambulatory Visit: Payer: Self-pay | Admitting: Obstetrics & Gynecology

## 2023-06-14 MED ORDER — BUPRENORPHINE HCL 8 MG SL SUBL: 8.0000 mg | SUBLINGUAL_TABLET | Freq: Three times a day (TID) | SUBLINGUAL | 0 refills | Status: DC

## 2023-06-14 NOTE — Addendum Note (Signed)
 Addended by: Wendelyn Halter on: 06/14/2023 07:25 AM   Modules accepted: Orders

## 2023-06-23 ENCOUNTER — Telehealth: Payer: Self-pay | Admitting: Clinical

## 2023-06-23 NOTE — Telephone Encounter (Signed)
Attempt call regarding referral; Unable to leave message as mailbox is full.

## 2023-06-24 ENCOUNTER — Encounter: Payer: Self-pay | Admitting: *Deleted

## 2023-06-24 ENCOUNTER — Telehealth: Payer: Self-pay | Admitting: *Deleted

## 2023-06-24 ENCOUNTER — Other Ambulatory Visit: Payer: Self-pay | Admitting: Family Medicine

## 2023-06-24 DIAGNOSIS — F1120 Opioid dependence, uncomplicated: Secondary | ICD-10-CM

## 2023-06-24 MED ORDER — BUPRENORPHINE HCL 8 MG SL SUBL: 8.0000 mg | SUBLINGUAL_TABLET | Freq: Four times a day (QID) | SUBLINGUAL | 0 refills | Status: DC

## 2023-06-24 NOTE — Progress Notes (Signed)
 Patient OUD needs escalating, plan to transfer to [facility] clinic. Increasing dose to 8 mg QID.  Will have patient come to [facility] clinic in a few weeks on 07/12/2023.

## 2023-06-24 NOTE — Telephone Encounter (Signed)
 Patient called stating she is needing a refill on her Suboxone . States prior to pregnancy she was taking 4/day but since being pregnant and coming to our office, she is only getting 3/day. States occasionally in the middle of the night, she wakes up shaking and takes half a tablet to get her through the night which makes her script run short.  She is not completely out of the medication but will be on Sunday.

## 2023-06-28 ENCOUNTER — Ambulatory Visit: Payer: MEDICAID | Admitting: Clinical

## 2023-06-28 ENCOUNTER — Encounter: Payer: MEDICAID | Admitting: Adult Health

## 2023-06-28 DIAGNOSIS — Z91199 Patient's noncompliance with other medical treatment and regimen due to unspecified reason: Secondary | ICD-10-CM

## 2023-06-28 NOTE — BH Specialist Note (Signed)
 Pt did not arrive to video visit and did not answer the phone; Left HIPPA-compliant message to call back Carolyn Cisco from [employer] for [employer] at [facility] at  [phone] [facility] office).  ?; left MyChart message for patient.  ? ?

## 2023-07-01 ENCOUNTER — Other Ambulatory Visit: Payer: MEDICAID

## 2023-07-01 ENCOUNTER — Encounter: Payer: MEDICAID | Admitting: Obstetrics & Gynecology

## 2023-07-04 ENCOUNTER — Encounter: Payer: MEDICAID | Admitting: Obstetrics & Gynecology

## 2023-07-04 ENCOUNTER — Other Ambulatory Visit: Payer: MEDICAID

## 2023-07-11 ENCOUNTER — Other Ambulatory Visit: Payer: Self-pay | Admitting: Obstetrics & Gynecology

## 2023-07-12 ENCOUNTER — Ambulatory Visit: Payer: MEDICAID | Admitting: Family Medicine

## 2023-07-14 ENCOUNTER — Encounter: Payer: MEDICAID | Admitting: Obstetrics & Gynecology

## 2023-07-14 ENCOUNTER — Other Ambulatory Visit: Payer: Self-pay | Admitting: Family Medicine

## 2023-07-14 DIAGNOSIS — F1120 Opioid dependence, uncomplicated: Secondary | ICD-10-CM

## 2023-07-19 ENCOUNTER — Other Ambulatory Visit: Payer: Self-pay | Admitting: Obstetrics & Gynecology

## 2023-07-19 DIAGNOSIS — Z363 Encounter for antenatal screening for malformations: Secondary | ICD-10-CM

## 2023-07-20 ENCOUNTER — Ambulatory Visit (INDEPENDENT_AMBULATORY_CARE_PROVIDER_SITE_OTHER): Payer: MEDICAID

## 2023-07-20 ENCOUNTER — Ambulatory Visit (INDEPENDENT_AMBULATORY_CARE_PROVIDER_SITE_OTHER): Payer: MEDICAID | Admitting: Obstetrics & Gynecology

## 2023-07-20 ENCOUNTER — Encounter: Payer: Self-pay | Admitting: Obstetrics & Gynecology

## 2023-07-20 VITALS — BP 139/65 | HR 84 | Wt 200.8 lb

## 2023-07-20 DIAGNOSIS — Z3A21 21 weeks gestation of pregnancy: Secondary | ICD-10-CM

## 2023-07-20 DIAGNOSIS — O24312 Unspecified pre-existing diabetes mellitus in pregnancy, second trimester: Secondary | ICD-10-CM

## 2023-07-20 DIAGNOSIS — F1120 Opioid dependence, uncomplicated: Secondary | ICD-10-CM

## 2023-07-20 DIAGNOSIS — B009 Herpesviral infection, unspecified: Secondary | ICD-10-CM | POA: Diagnosis not present

## 2023-07-20 DIAGNOSIS — O0992 Supervision of high risk pregnancy, unspecified, second trimester: Secondary | ICD-10-CM

## 2023-07-20 DIAGNOSIS — E1121 Type 2 diabetes mellitus with diabetic nephropathy: Secondary | ICD-10-CM

## 2023-07-20 DIAGNOSIS — Z363 Encounter for antenatal screening for malformations: Secondary | ICD-10-CM

## 2023-07-20 DIAGNOSIS — O99322 Drug use complicating pregnancy, second trimester: Secondary | ICD-10-CM

## 2023-07-20 DIAGNOSIS — F1990 Other psychoactive substance use, unspecified, uncomplicated: Secondary | ICD-10-CM | POA: Diagnosis not present

## 2023-07-20 DIAGNOSIS — E11649 Type 2 diabetes mellitus with hypoglycemia without coma: Secondary | ICD-10-CM

## 2023-07-20 MED ORDER — BUPRENORPHINE HCL 8 MG SL SUBL: 8.0000 mg | SUBLINGUAL_TABLET | Freq: Four times a day (QID) | SUBLINGUAL | 0 refills | Status: AC

## 2023-07-20 NOTE — Progress Notes (Signed)
 HIGH-RISK PREGNANCY VISIT Patient name: Heather [name] MRN [id number]  Date of birth: 01-31-1995 Chief Complaint:   Routine Prenatal Visit  History of Present Illness:   Heather [name] is a 28 y.o. G3P0111 female at [redacted]w[redacted]d with an Estimated Date of Delivery: 11/24/23 being seen today for ongoing management of a high-risk pregnancy complicated by  1) Polysubstance use -pt was scheduled with [facility] 7/8- pt had transportation issues and was unable to make this appointment - She states she is going through withdrawal as she has been without this medication -currently on Subutex  8mg  4x daily  - Of note recent urine toxicology performed at [facility] tested positive for cocaine amphetamines and benzodiazepines  2) Class F DM -currently on MTF 1000mg  bid -does not having working machine- per pt insurance will not cover a new machine -last A1c 7.2 (05/2023)  3) Abnormal pap -ASCUS-H, HPV+ (other)  4) UTI -currently on antibiotic- just started today -Notes vaginal pain and dysuria - Patient seen at [facility] on 7/13-Per note patient given IV vancomycin  x 1 then macrobid   Contractions: Not present. Vag. Bleeding: None.  Movement: Present. denies leaking of fluid.      05/24/2023    2:24 PM 10/18/2016   11:25 AM 10/04/2016    2:43 PM 02/02/2016    9:45 AM  Depression screen PHQ 2/9  Decreased Interest 1 0 0 0  Down, Depressed, Hopeless 0 0 0 0  PHQ - 2 Score 1 0 0 0  Altered sleeping 1     Tired, decreased energy 3     Change in appetite 0     Feeling bad or failure about yourself  1     Trouble concentrating 1     Moving slowly or fidgety/restless 0     Suicidal thoughts 0     PHQ-9 Score 7        Current Outpatient Medications  Medication Instructions   Accu-Chek Softclix Lancets lancets Use as instructed to check blood sugar 4 times daily   aspirin  EC 162 mg, Oral, Daily, Swallow whole.   Blood Glucose Monitoring Suppl (ACCU-CHEK GUIDE ME) w/Device KIT 1 each, Does not apply, 4  times daily   Blood Pressure Monitor MISC For regular home bp monitoring during pregnancy   buprenorphine  (SUBUTEX ) 8 mg, Sublingual, 4 times daily   clonazePAM  (KLONOPIN ) 1 mg, Oral, 3 times daily   Continuous Glucose Receiver (FREESTYLE LIBRE 3 READER) DEVI Use as directed to monitor blood glucose   Continuous Glucose Sensor (FREESTYLE LIBRE 3 SENSOR) MISC Use as directed to monitor blood glucose   Doxylamine -Pyridoxine  (DICLEGIS ) 10-10 MG TBEC 2 tablets, Oral, Nightly   gabapentin  (NEURONTIN ) 800 mg, Oral, 3 times daily   glucose blood test strip Use as instructed to check blood sugar four times daily   hydrOXYzine  (ATARAX ) 10 mg, Oral, 3 times daily PRN   insulin  aspart (NOVOLOG ) 25-40 Units, 3 times daily before meals   metFORMIN  (GLUCOPHAGE -XR) 1,000 mg, Oral, 2 times daily with meals   nitrofurantoin  (macrocrystal-monohydrate) (MACROBID ) 100 mg, 2 times daily   omeprazole  (PRILOSEC) 20 mg, Oral, Daily, 1 tablet a day   ondansetron  (ZOFRAN -ODT) 4 mg, Oral, Every 8 hours PRN   pantoprazole  (PROTONIX ) 20 mg, Oral, Daily   Prenatal Vit-Fe Fumarate-FA (PRENATAL VITAMIN PLUS LOW IRON ) 27-1 MG TABS 1 tablet, Oral, Daily   promethazine  (PHENERGAN ) 12.5-25 mg, Oral, Every 6 hours PRN   sertraline  (ZOLOFT ) 25 mg, Oral, Daily   triamcinolone  ointment (KENALOG ) 0.5 % 1 Application,  Topical, 2 times daily   valACYclovir  (VALTREX ) 1,000 mg, Oral, 2 times daily     Review of Systems:   Pertinent items are noted in HPI  Pertinent History Reviewed:  Reviewed past medical,surgical, social, obstetrical and family history.  Reviewed problem list, medications and allergies. Physical Assessment:   Vitals:   07/20/23 1051  BP: 139/65  Pulse: 84  Weight: 200 lb 12.8 oz (91.1 kg)  Body mass index is 33.41 kg/m.           Physical Examination:   General appearance: alert, well appearing, and in no distress  Mental status: anxious  Skin: warm & dry   Extremities:   no edema   Cardiovascular:  normal heart rate noted  Respiratory: normal respiratory effort, no distress  Abdomen: gravid, soft, non-tender  Pelvic: Cervical exam deferred         Fetal Status:     Movement: Present    Fetal Surveillance Testing today: cephalic,anterior placenta gr 0,CX 3 cm,FHR 135 bpm,SVP of fluid 5.2 cm,EFW 433 g 29%,limited view of spine    Chaperone: N/A    No results found for this or any previous visit (from the past 24 hours).   Assessment & Plan:  High-risk pregnancy: G3P0111 at [redacted]w[redacted]d with an Estimated Date of Delivery: 11/24/23   1) polysubstance abuse - Subutex  x 1 week sent in - Message sent to reach clinic []  Patient possibly to drop off urine sample later today  2) UTI - Encouraged patient to take antibiotic regularly - Should she note fever, back pain or worsening pain-advised she go down to women's and children for possible pyelonephritis  3) Class F DM - In the interim if patient can afford her own glucometer advised that she buy one - She can then call us  with the machine type and we can order test strips - Will also work to try to arrange for a CGM - Suspect that she may need to start on a low dose insulin  however without any type of sugar log it is unclear -normal anatomy scan today, continue growth every 4 weeks - Message sent for fetal echo   Meds:  Meds ordered this encounter  Medications   buprenorphine  (SUBUTEX ) 8 MG SUBL SL tablet    Sig: Place 1 tablet (8 mg total) under the tongue 4 (four) times daily for 7 days.    Dispense:  28 tablet    Refill:  0    2 week supply    Labs/procedures today: anatomy scan  Treatment Plan: As outlined above  Reviewed: Preterm labor symptoms and general obstetric precautions including but not limited to vaginal bleeding, contractions, leaking of fluid and fetal movement were reviewed in detail with the patient.  All questions were answered.   Follow-up: Return in about 1 week (around 07/27/2023) for HROB visit and US  in  4wk growth.   Future Appointments  Date Time Provider Department Center  07/28/2023  1:50 PM Marilynn Nest, DO [facility]  08/22/2023 11:30 AM [facility] US  [facility] None  08/22/2023  1:30 PM Norvell Ureste, DO [facility]    Orders Placed This Encounter  Procedures   US  OB Follow Up    Dynasty Holquin, DO Attending Obstetrician & Gynecologist, Faculty Practice Center for [employer], [facility]

## 2023-07-20 NOTE — Progress Notes (Signed)
 US  21+6 wks,cephalic,anterior placenta gr 0,CX 3 cm,FHR 135 bpm,SVP of fluid 5.2 cm,EFW 433 g 29%,limited view of spine,please have pt come back for additional images,no obvious abnormalities

## 2023-07-21 ENCOUNTER — Encounter: Payer: Self-pay | Admitting: Obstetrics and Gynecology

## 2023-07-21 ENCOUNTER — Other Ambulatory Visit: Payer: Self-pay | Admitting: Obstetrics & Gynecology

## 2023-07-21 ENCOUNTER — Telehealth: Payer: Self-pay

## 2023-07-21 MED ORDER — DEXCOM G7 SENSOR MISC: 1.0000 | Freq: Every day | 3 refills | Status: DC

## 2023-07-21 NOTE — Progress Notes (Signed)
 Per provider request, PA sent for Dexcom to Cover My Meds, pending.  Silvano LELON Piano, RN

## 2023-07-21 NOTE — Telephone Encounter (Signed)
 RN attempted to call patient to notify of upcoming scheduled appointment with Delon Emms, NP, voicemail box full, unable to leave message.  Silvano LELON Piano, RN

## 2023-07-26 ENCOUNTER — Ambulatory Visit (INDEPENDENT_AMBULATORY_CARE_PROVIDER_SITE_OTHER): Payer: MEDICAID | Admitting: Obstetrics and Gynecology

## 2023-07-26 ENCOUNTER — Telehealth: Payer: Self-pay

## 2023-07-26 ENCOUNTER — Ambulatory Visit: Payer: MEDICAID

## 2023-07-26 DIAGNOSIS — E119 Type 2 diabetes mellitus without complications: Secondary | ICD-10-CM

## 2023-07-26 DIAGNOSIS — Z794 Long term (current) use of insulin: Secondary | ICD-10-CM

## 2023-07-26 NOTE — Telephone Encounter (Signed)
 RN spoke with Donny at [facility] to see if Dexcom ready for pick up, she reported showing that Dexcom is requiring PA. RN reported PA sent, currently still pending.  Silvano LELON Piano, RN

## 2023-07-26 NOTE — Progress Notes (Signed)
 Heather [name] was scheduled today to discuss management of type 2 DM. Dexcom has been ordered and a prior authorization is currently in process.   Our goal today was to place a Dexcom in the office and get her set up with data sharing. She called the office to let us  know that her car was overheating and she is not able to make it to the office.   She is requesting to return tomorrow for [facility] placement. Will arrange with Silvano our RN for placement in the office tomorrow with Data sharing set up.   Dorita Delon FERNS, NP 07/26/2023 11:49 AM

## 2023-07-27 ENCOUNTER — Telehealth: Payer: Self-pay | Admitting: Family Medicine

## 2023-07-27 ENCOUNTER — Ambulatory Visit (INDEPENDENT_AMBULATORY_CARE_PROVIDER_SITE_OTHER): Payer: MEDICAID

## 2023-07-27 DIAGNOSIS — Z3A22 22 weeks gestation of pregnancy: Secondary | ICD-10-CM

## 2023-07-27 DIAGNOSIS — O24112 Pre-existing diabetes mellitus, type 2, in pregnancy, second trimester: Secondary | ICD-10-CM

## 2023-07-27 NOTE — Progress Notes (Signed)
 Pt here for Dexcom placement. Dexcom placed on Right Arm.  Pt unable to enable app to work on phone. Patient given receiver for transmitting of data. Pt educated to keep receiver with her and charged with plug in and adapter. Pt was instructed to bring to every appointment to enable upload of the data. Pt verbalized understanding.   Silvano LELON Piano, RN

## 2023-07-27 NOTE — Telephone Encounter (Signed)
 Attempted to call patient to reschedule missed appointment. Did not get an answer and mailbox is full. Will send mychart message.

## 2023-07-28 ENCOUNTER — Other Ambulatory Visit: Payer: Self-pay | Admitting: Obstetrics and Gynecology

## 2023-07-28 ENCOUNTER — Encounter: Payer: Self-pay | Admitting: Obstetrics & Gynecology

## 2023-07-28 ENCOUNTER — Ambulatory Visit: Payer: MEDICAID | Admitting: Obstetrics & Gynecology

## 2023-07-28 VITALS — BP 117/73 | HR 96 | Wt 195.2 lb

## 2023-07-28 DIAGNOSIS — O0992 Supervision of high risk pregnancy, unspecified, second trimester: Secondary | ICD-10-CM | POA: Diagnosis not present

## 2023-07-28 DIAGNOSIS — Z794 Long term (current) use of insulin: Secondary | ICD-10-CM

## 2023-07-28 DIAGNOSIS — F1990 Other psychoactive substance use, unspecified, uncomplicated: Secondary | ICD-10-CM | POA: Diagnosis not present

## 2023-07-28 DIAGNOSIS — Z3A23 23 weeks gestation of pregnancy: Secondary | ICD-10-CM | POA: Diagnosis not present

## 2023-07-28 DIAGNOSIS — N390 Urinary tract infection, site not specified: Secondary | ICD-10-CM | POA: Insufficient documentation

## 2023-07-28 DIAGNOSIS — E119 Type 2 diabetes mellitus without complications: Secondary | ICD-10-CM | POA: Diagnosis not present

## 2023-07-28 DIAGNOSIS — N3000 Acute cystitis without hematuria: Secondary | ICD-10-CM

## 2023-07-28 MED ORDER — BUPRENORPHINE HCL 8 MG SL SUBL: 8.0000 mg | SUBLINGUAL_TABLET | Freq: Four times a day (QID) | SUBLINGUAL | 0 refills | Status: DC

## 2023-07-28 MED ORDER — INSULIN GLARGINE-YFGN 100 UNIT/ML ~~LOC~~ SOPN: 4.0000 [IU] | PEN_INJECTOR | Freq: Every day | SUBCUTANEOUS | 0 refills | Status: DC

## 2023-07-28 MED ORDER — DEXCOM G7 SENSOR MISC: 1.0000 | Freq: Every day | 3 refills | Status: DC

## 2023-07-28 MED ORDER — INSULIN GLARGINE-YFGN 100 UNIT/ML ~~LOC~~ SOPN
3.0000 [IU] | PEN_INJECTOR | Freq: Every day | SUBCUTANEOUS | 3 refills | Status: DC
Start: 2023-07-28 — End: 2023-08-11

## 2023-07-28 NOTE — Progress Notes (Signed)
 HIGH-RISK PREGNANCY VISIT Patient name: Heather [name] MRN [id number]  Date of birth: 1995-08-03 Chief Complaint:   High Risk Gestation (Feels tired and dizzy)  History of Present Illness:   Heather [name] is a 28 y.o. [phone] female at [redacted]w[redacted]d with an Estimated Date of Delivery: 11/24/23 being seen today for ongoing management of a high-risk pregnancy complicated by:  1) polysubstance use/OUD -doing ok with subutux 8mg  4x daily, Medicaid only covers 3 times daily-paying out-of-pocket when needed - In the process of trying to reestablish with [facility]  2) h/o UTI -recently completed treatment, []  recheck next visit  3) Class F DM - Patient was able to get Dexcom with assistance from [facility] [facility] -sugars log reviewed- only on for 1 day  4) HSV  Today she reports no complaints.   Contractions: Not present. Vag. Bleeding: None.  Movement: Present. denies leaking of fluid.      05/24/2023    2:24 PM 10/18/2016   11:25 AM 10/04/2016    2:43 PM 02/02/2016    9:45 AM  Depression screen PHQ 2/9  Decreased Interest 1 0 0 0  Down, Depressed, Hopeless 0 0 0 0  PHQ - 2 Score 1 0 0 0  Altered sleeping 1     Tired, decreased energy 3     Change in appetite 0     Feeling bad or failure about yourself  1     Trouble concentrating 1     Moving slowly or fidgety/restless 0     Suicidal thoughts 0     PHQ-9 Score 7        Current Outpatient Medications  Medication Instructions   Accu-Chek Softclix Lancets lancets Use as instructed to check blood sugar 4 times daily   aspirin  EC 162 mg, Oral, Daily, Swallow whole.   Blood Glucose Monitoring Suppl (ACCU-CHEK GUIDE ME) w/Device KIT 1 each, Does not apply, 4 times daily   Blood Pressure Monitor MISC For regular home bp monitoring during pregnancy   buprenorphine  (SUBUTEX ) 8 mg, Sublingual, 4 times daily   clonazePAM  (KLONOPIN ) 1 mg, Oral, 3 times daily   Continuous Glucose Sensor (DEXCOM G7 SENSOR) MISC 1 each, Does not apply, Daily,  Change sensor every 10 days, please give 30 day supply. Patient is on insulin .   Doxylamine -Pyridoxine  (DICLEGIS ) 10-10 MG TBEC 2 tablets, Oral, Nightly   gabapentin  (NEURONTIN ) 800 mg, Oral, 3 times daily   glucose blood test strip Use as instructed to check blood sugar four times daily   hydrOXYzine  (ATARAX ) 10 mg, Oral, 3 times daily PRN   insulin  aspart (NOVOLOG ) 25-40 Units, 3 times daily before meals   insulin  glargine-yfgn (SEMGLEE ) 3 Units, Subcutaneous, Daily at bedtime   metFORMIN  (GLUCOPHAGE -XR) 1,000 mg, Oral, 2 times daily with meals   nitrofurantoin  (macrocrystal-monohydrate) (MACROBID ) 100 mg, 2 times daily   omeprazole  (PRILOSEC) 20 mg, Oral, Daily, 1 tablet a day   ondansetron  (ZOFRAN -ODT) 4 mg, Oral, Every 8 hours PRN   pantoprazole  (PROTONIX ) 20 mg, Oral, Daily   Prenatal Vit-Fe Fumarate-FA (PRENATAL VITAMIN PLUS LOW IRON ) 27-1 MG TABS 1 tablet, Oral, Daily   promethazine  (PHENERGAN ) 12.5-25 mg, Oral, Every 6 hours PRN   sertraline  (ZOLOFT ) 25 mg, Oral, Daily   triamcinolone  ointment (KENALOG ) 0.5 % 1 Application, Topical, 2 times daily   valACYclovir  (VALTREX ) 1,000 mg, Oral, 2 times daily     Review of Systems:   Pertinent items are noted in HPI Denies abnormal vaginal discharge w/ itching/odor/irritation, headaches, visual changes,  shortness of breath, chest pain, abdominal pain, severe nausea/vomiting, or problems with urination or bowel movements unless otherwise stated above. Pertinent History Reviewed:  Reviewed past medical,surgical, social, obstetrical and family history.  Reviewed problem list, medications and allergies. Physical Assessment:   Vitals:   07/28/23 1329  BP: 117/73  Pulse: 96  Weight: 195 lb 3.2 oz (88.5 kg)  Body mass index is 32.48 kg/m.           Physical Examination:   General appearance: alert, well appearing, and in no distress  Mental status: normal mood, behavior, speech, dress, motor activity, and thought processes  Skin: warm  & dry   Extremities: Edema: None    Cardiovascular: normal heart rate noted  Respiratory: normal respiratory effort, no distress  Abdomen: gravid, soft, non-tender  Pelvic: Cervical exam deferred         Fetal Status: Fetal Heart Rate (bpm): 150   Movement: Present    Fetal Surveillance Testing today: doppler   Chaperone: N/A    No results found for this or any previous visit (from the past 24 hours).   Assessment & Plan:  High-risk pregnancy: G3P0111 at [redacted]w[redacted]d with an Estimated Date of Delivery: 11/24/23   1) Class F DM -Dexcom supplies in process - Will plan to start on Semglee  3 units nightly -plan for fetal ECHO  2) OUD - Prescription sent in for the next 2 weeks  3) UTI []  recheck next visit  4) HSV -currently asymptomatic  Meds:  Meds ordered this encounter  Medications   DISCONTD: insulin  glargine-yfgn (SEMGLEE ) 100 UNIT/ML Pen    Sig: Inject 4 Units into the skin at bedtime.    Dispense:  1.2 mL    Refill:  0   insulin  glargine-yfgn (SEMGLEE ) 100 UNIT/ML Pen    Sig: Inject 3 Units into the skin at bedtime.    Dispense:  3 mL    Refill:  3   buprenorphine  (SUBUTEX ) 8 MG SUBL SL tablet    Sig: Place 1 tablet (8 mg total) under the tongue 4 (four) times daily for 14 days.    Dispense:  60 tablet    Refill:  0    Labs/procedures today: doppler  Treatment Plan:  as outlined above  Reviewed: Preterm labor symptoms and general obstetric precautions including but not limited to vaginal bleeding, contractions, leaking of fluid and fetal movement were reviewed in detail with the patient.  All questions were answered.   Follow-up: Return in about 2 weeks (around 08/11/2023) for HROB visit.   Future Appointments  Date Time Provider Department Center  08/11/2023  1:50 PM Hyde Sires, DO [facility]  08/22/2023 11:30 AM [facility] US  [facility] None  08/22/2023  1:30 PM Lesette Frary, DO [facility]    No orders of the defined types were placed in this  encounter.   Gini Caputo, DO Attending Obstetrician & Gynecologist, [facility] for [employer], [facility]

## 2023-08-01 ENCOUNTER — Other Ambulatory Visit: Payer: MEDICAID

## 2023-08-07 ENCOUNTER — Other Ambulatory Visit: Payer: Self-pay | Admitting: Obstetrics & Gynecology

## 2023-08-09 ENCOUNTER — Other Ambulatory Visit: Payer: Self-pay | Admitting: Obstetrics & Gynecology

## 2023-08-09 DIAGNOSIS — E119 Type 2 diabetes mellitus without complications: Secondary | ICD-10-CM

## 2023-08-11 ENCOUNTER — Encounter: Payer: Self-pay | Admitting: Obstetrics & Gynecology

## 2023-08-11 ENCOUNTER — Telehealth: Payer: Self-pay | Admitting: *Deleted

## 2023-08-11 ENCOUNTER — Other Ambulatory Visit (HOSPITAL_COMMUNITY)
Admission: RE | Admit: 2023-08-11 | Discharge: 2023-08-11 | Disposition: A | Discharge status: Home / Self Care | Payer: MEDICAID | Source: Ambulatory Visit | Attending: Obstetrics & Gynecology | Admitting: Obstetrics & Gynecology

## 2023-08-11 ENCOUNTER — Ambulatory Visit (INDEPENDENT_AMBULATORY_CARE_PROVIDER_SITE_OTHER): Payer: MEDICAID | Admitting: Obstetrics & Gynecology

## 2023-08-11 VITALS — BP 132/74 | HR 92 | Wt 200.0 lb

## 2023-08-11 DIAGNOSIS — O26892 Other specified pregnancy related conditions, second trimester: Secondary | ICD-10-CM

## 2023-08-11 DIAGNOSIS — Z113 Encounter for screening for infections with a predominantly sexual mode of transmission: Secondary | ICD-10-CM | POA: Diagnosis present

## 2023-08-11 DIAGNOSIS — Z91199 Patient's noncompliance with other medical treatment and regimen due to unspecified reason: Secondary | ICD-10-CM

## 2023-08-11 DIAGNOSIS — E119 Type 2 diabetes mellitus without complications: Secondary | ICD-10-CM

## 2023-08-11 DIAGNOSIS — N898 Other specified noninflammatory disorders of vagina: Secondary | ICD-10-CM

## 2023-08-11 DIAGNOSIS — O0992 Supervision of high risk pregnancy, unspecified, second trimester: Secondary | ICD-10-CM

## 2023-08-11 DIAGNOSIS — Z3A25 25 weeks gestation of pregnancy: Secondary | ICD-10-CM

## 2023-08-11 DIAGNOSIS — F1990 Other psychoactive substance use, unspecified, uncomplicated: Secondary | ICD-10-CM

## 2023-08-11 DIAGNOSIS — F1190 Opioid use, unspecified, uncomplicated: Secondary | ICD-10-CM

## 2023-08-11 DIAGNOSIS — N390 Urinary tract infection, site not specified: Secondary | ICD-10-CM

## 2023-08-11 DIAGNOSIS — F418 Other specified anxiety disorders: Secondary | ICD-10-CM

## 2023-08-11 DIAGNOSIS — Z794 Long term (current) use of insulin: Secondary | ICD-10-CM

## 2023-08-11 LAB — POCT URINALYSIS DIPSTICK OB
Blood, UA: NEGATIVE
Glucose, UA: NEGATIVE
Ketones, UA: NEGATIVE
Nitrite, UA: NEGATIVE

## 2023-08-11 MED ORDER — BUPRENORPHINE HCL 8 MG SL SUBL: 8.0000 mg | SUBLINGUAL_TABLET | Freq: Four times a day (QID) | SUBLINGUAL | 0 refills | Status: DC

## 2023-08-11 MED ORDER — CLONAZEPAM 1 MG PO TABS: 1.0000 mg | ORAL_TABLET | Freq: Three times a day (TID) | ORAL | 0 refills | Status: DC

## 2023-08-11 MED ORDER — NITROFURANTOIN MONOHYD MACRO 100 MG PO CAPS: 100.0000 mg | ORAL_CAPSULE | Freq: Every day | ORAL | 2 refills | Status: DC

## 2023-08-11 NOTE — Progress Notes (Signed)
 HIGH-RISK PREGNANCY VISIT Patient name: Heather [name] MRN [id number]  Date of birth: 04/08/1995 Chief Complaint:   Routine Prenatal Visit  History of Present Illness:   Heather [name] is a 28 y.o. G3P0111 female at [redacted]w[redacted]d with an Estimated Date of Delivery: 11/24/23 being seen today for ongoing management of a high-risk pregnancy complicated by:   -T2DM Dexcom was placed at [facility], patient notes that it fell out when she was showering the next day.  Notes that while she did have it on her sugars were good and states it was in the 90s. She has not picked up another Dexcom and has not been checking her sugars She did not pick up Semglee  and states her insurance will not cover  Recurrent UTI Pt's clinical story disorderly- seen in ED due to vaginal bleeding and difficulty voiding.  The patient reports they told her that her part of her placenta was not attached.  [facility] records reviewed.  Patient was treated with Rocephin 1 g IM.  She also reports that they had difficulty with performing an In-N-Out catheter and was told she may have a questionable urethral injury. Pt notes that she continues to have E.Coli UTI- however, I only see a UA and not a culture.  UA from yesterday with large leuks and WBCs which are indeed consistent with a UTI.  She is currently not on an antibiotic  - Today she notes that she is still having difficulty with urination and feeling like she needs to void all the time.  Denies fever or chills.  Still noting spotting that she thinks is coming from her urine.  Polysubstance use - Currently on Subutex  and Klonopin -patient requesting refill of both at this time -Recent Utox at [facility] negative cocaine   Contractions: Not present. Vag. Bleeding: Small.  Movement: Present. denies leaking of fluid.      05/24/2023    2:24 PM 10/18/2016   11:25 AM 10/04/2016    2:43 PM 02/02/2016    9:45 AM  Depression screen PHQ 2/9  Decreased Interest 1 0 0 0  Down, Depressed,  Hopeless 0 0 0 0  PHQ - 2 Score 1 0 0 0  Altered sleeping 1     Tired, decreased energy 3     Change in appetite 0     Feeling bad or failure about yourself  1     Trouble concentrating 1     Moving slowly or fidgety/restless 0     Suicidal thoughts 0     PHQ-9 Score 7        Current Outpatient Medications  Medication Instructions   Accu-Chek Softclix Lancets lancets Use as instructed to check blood sugar 4 times daily   aspirin  EC 162 mg, Oral, Daily, Swallow whole.   Blood Glucose Monitoring Suppl (ACCU-CHEK GUIDE ME) w/Device KIT 1 each, Does not apply, 4 times daily   Blood Pressure Monitor MISC For regular home bp monitoring during pregnancy   buprenorphine  (SUBUTEX ) 8 mg, Sublingual, 4 times daily   clonazePAM  (KLONOPIN ) 1 mg, Oral, 3 times daily   Continuous Glucose Sensor (DEXCOM G7 SENSOR) MISC 1 each, Does not apply, Daily, Change sensor every 10 days, please give 30 day supply. Patient is on insulin .   Doxylamine -Pyridoxine  (DICLEGIS ) 10-10 MG TBEC 2 tablets, Oral, Nightly   gabapentin  (NEURONTIN ) 800 mg, Oral, 3 times daily   glucose blood test strip Use as instructed to check blood sugar four times daily   hydrOXYzine  (ATARAX ) 10 mg, Oral, 3  times daily PRN   metFORMIN  (GLUCOPHAGE -XR) 1,000 mg, Oral, 2 times daily with meals   nitrofurantoin  (macrocrystal-monohydrate) (MACROBID ) 100 mg, Oral, Daily   omeprazole  (PRILOSEC) 20 mg, Oral, Daily, 1 tablet a day   ondansetron  (ZOFRAN -ODT) 4 mg, Oral, Every 8 hours PRN   pantoprazole  (PROTONIX ) 20 mg, Oral, Daily   Prenatal Vit-Fe Fumarate-FA (PRENATAL VITAMIN PLUS LOW IRON ) 27-1 MG TABS 1 tablet, Oral, Daily   promethazine  (PHENERGAN ) 12.5-25 mg, Oral, Every 6 hours PRN   sertraline  (ZOLOFT ) 25 mg, Oral, Daily     Review of Systems:   Pertinent items are noted in HPI Pertinent History Reviewed:  Reviewed past medical,surgical, social, obstetrical and family history.  Reviewed problem list, medications and  allergies. Physical Assessment:   Vitals:   08/11/23 1413  BP: 132/74  Pulse: 92  Weight: 200 lb (90.7 kg)  Body mass index is 33.28 kg/m.           Physical Examination:   General appearance: alert, no acute distress  Mental status: normal mood and behavior  Skin: warm & dry   Extremities:   no edema, no calf tenderness bilaterally   Cardiovascular: normal heart rate noted  Respiratory: normal respiratory effort, no distress  Abdomen: gravid, soft, non-tender  Pelvic: Normal external genitalia, vaginal pink moist mucosa, frothy white discharge noted.  No evidence of bleeding.  Cervix appears closed  Straight Catheterization Procedure for PVR: After verbal consent was obtained from the patient for catheterization to assess bladder emptying and residual volume the urethra and surrounding tissues were prepped with betadine  and an in and out catheterization was performed.  Urine appeared clear yellow. The patient tolerated the procedure well.  Fetal Status: Fetal Heart Rate (bpm): 130   Movement: Present    Fetal Surveillance Testing today: doppler   Chaperone: Alan Fischer    Results for orders placed or performed in visit on 08/11/23 (from the past 24 hours)  POC Urinalysis Dipstick OB   Collection Time: 08/11/23  4:20 PM  Result Value Ref Range   Color, UA     Clarity, UA     Glucose, UA Negative Negative   Bilirubin, UA     Ketones, UA Neg    Spec Grav, UA     Blood, UA Neg    pH, UA     POC,PROTEIN,UA Trace Negative, Trace, Small (1+), Moderate (2+), Large (3+), 4+   Urobilinogen, UA     Nitrite, UA Neg    Leukocytes, UA Trace (A) Negative   Appearance     Odor       Assessment & Plan:  High-risk pregnancy: [id number] at [redacted]w[redacted]d with an Estimated Date of Delivery: 11/24/23   1) OUD, h/o polysubstance use - Continue current medication including Subutex  and Klonopin  - Encourage patient to try to work on weaning Klonopin  as possible  2) type 2 diabetes- Class F -  Patient does not have Dexcom and is likely too soon to replace however the Dexcom that was placed was a sample and so I have encouraged patient to call pharmacy as I anticipate the prescription is waiting for her - Noncompliance patient has not yet picked up a backup glucometer and has not been checking her sugars.  Advised if possible - Patient noncompliant with echo - Next growth scheduled  3) Urinary issues/recurrent UTI - No acute abnormalities noted on exam.  Additionally on straight cath there was no blood noted in her urine - Await today's urine culture for further evaluation  and will plan to start on suppression therapy - Macrobid  daily  Meds:  Meds ordered this encounter  Medications   clonazePAM  (KLONOPIN ) 1 MG tablet    Sig: Take 1 tablet (1 mg total) by mouth 3 (three) times daily for 14 days.    Dispense:  30 tablet    Refill:  0   buprenorphine  (SUBUTEX ) 8 MG SUBL SL tablet    Sig: Place 1 tablet (8 mg total) under the tongue 4 (four) times daily for 14 days.    Dispense:  60 tablet    Refill:  0   nitrofurantoin , macrocrystal-monohydrate, (MACROBID ) 100 MG capsule    Sig: Take 1 capsule (100 mg total) by mouth daily.    Dispense:  90 capsule    Refill:  2    Labs/procedures today: doppler  Treatment Plan:  as outlined above  Reviewed: Preterm labor symptoms and general obstetric precautions including but not limited to vaginal bleeding, contractions, leaking of fluid and fetal movement were reviewed in detail with the patient.  All questions were answered.   Follow-up: Return in about 2 weeks (around 08/25/2023) for HROB visit.   Future Appointments  Date Time Provider Department Center  08/22/2023 11:30 AM [facility] - [facility] US  [facility] None  08/22/2023  1:30 PM Takeru Bose, DO [facility]    Orders Placed This Encounter  Procedures   Urine Culture   POC Urinalysis Dipstick OB    Yaremi Stahlman, DO Attending Obstetrician & Gynecologist, [facility]  [facility] for [employer], [facility]

## 2023-08-11 NOTE — Telephone Encounter (Signed)
 Called [facility] to check on status of referral for fetal echo.  Office states they have attempted to reach the patient four times and have sent a letter with no response from the patient.

## 2023-08-13 ENCOUNTER — Ambulatory Visit: Payer: Self-pay | Admitting: Obstetrics & Gynecology

## 2023-08-13 DIAGNOSIS — A599 Trichomoniasis, unspecified: Secondary | ICD-10-CM

## 2023-08-13 LAB — URINE CULTURE: Organism ID, Bacteria: NO GROWTH

## 2023-08-15 LAB — CERVICOVAGINAL ANCILLARY ONLY
Bacterial Vaginitis (gardnerella): POSITIVE — AB
Candida Glabrata: NEGATIVE
Candida Vaginitis: NEGATIVE
Chlamydia: NEGATIVE
Comment: NEGATIVE
Comment: NEGATIVE
Comment: NEGATIVE
Comment: NEGATIVE
Comment: NEGATIVE
Comment: NORMAL
Neisseria Gonorrhea: NEGATIVE
Trichomonas: POSITIVE — AB

## 2023-08-15 MED ORDER — METRONIDAZOLE 500 MG PO TABS: 500.0000 mg | ORAL_TABLET | Freq: Two times a day (BID) | ORAL | 1 refills | Status: AC

## 2023-08-22 ENCOUNTER — Ambulatory Visit: Payer: MEDICAID | Admitting: Obstetrics & Gynecology

## 2023-08-22 ENCOUNTER — Ambulatory Visit (INDEPENDENT_AMBULATORY_CARE_PROVIDER_SITE_OTHER): Payer: MEDICAID

## 2023-08-22 VITALS — BP 122/73 | HR 85 | Wt 209.2 lb

## 2023-08-22 DIAGNOSIS — F1190 Opioid use, unspecified, uncomplicated: Secondary | ICD-10-CM

## 2023-08-22 DIAGNOSIS — O99323 Drug use complicating pregnancy, third trimester: Secondary | ICD-10-CM | POA: Diagnosis not present

## 2023-08-22 DIAGNOSIS — Z794 Long term (current) use of insulin: Secondary | ICD-10-CM

## 2023-08-22 DIAGNOSIS — O24312 Unspecified pre-existing diabetes mellitus in pregnancy, second trimester: Secondary | ICD-10-CM | POA: Diagnosis not present

## 2023-08-22 DIAGNOSIS — O24113 Pre-existing diabetes mellitus, type 2, in pregnancy, third trimester: Secondary | ICD-10-CM

## 2023-08-22 DIAGNOSIS — Z91199 Patient's noncompliance with other medical treatment and regimen due to unspecified reason: Secondary | ICD-10-CM

## 2023-08-22 DIAGNOSIS — O0992 Supervision of high risk pregnancy, unspecified, second trimester: Secondary | ICD-10-CM | POA: Diagnosis not present

## 2023-08-22 DIAGNOSIS — O98313 Other infections with a predominantly sexual mode of transmission complicating pregnancy, third trimester: Secondary | ICD-10-CM

## 2023-08-22 DIAGNOSIS — F1990 Other psychoactive substance use, unspecified, uncomplicated: Secondary | ICD-10-CM

## 2023-08-22 DIAGNOSIS — Z3A28 28 weeks gestation of pregnancy: Secondary | ICD-10-CM | POA: Diagnosis not present

## 2023-08-22 DIAGNOSIS — E1121 Type 2 diabetes mellitus with diabetic nephropathy: Secondary | ICD-10-CM

## 2023-08-22 DIAGNOSIS — O99343 Other mental disorders complicating pregnancy, third trimester: Secondary | ICD-10-CM

## 2023-08-22 DIAGNOSIS — Z3A26 26 weeks gestation of pregnancy: Secondary | ICD-10-CM

## 2023-08-22 DIAGNOSIS — E119 Type 2 diabetes mellitus without complications: Secondary | ICD-10-CM

## 2023-08-22 DIAGNOSIS — F418 Other specified anxiety disorders: Secondary | ICD-10-CM

## 2023-08-22 DIAGNOSIS — B009 Herpesviral infection, unspecified: Secondary | ICD-10-CM

## 2023-08-22 LAB — GLUCOSE, POCT (MANUAL RESULT ENTRY): POC Glucose: 117 mg/dL — AB (ref 70–99)

## 2023-08-22 MED ORDER — SERTRALINE HCL 50 MG PO TABS: 50.0000 mg | ORAL_TABLET | Freq: Every day | ORAL | 11 refills | Status: DC

## 2023-08-22 MED ORDER — BUPRENORPHINE HCL 8 MG SL SUBL: 8.0000 mg | SUBLINGUAL_TABLET | Freq: Four times a day (QID) | SUBLINGUAL | 0 refills | Status: AC

## 2023-08-22 MED ORDER — CLONAZEPAM 1 MG PO TABS
1.0000 mg | ORAL_TABLET | Freq: Three times a day (TID) | ORAL | 0 refills | Status: DC
Start: 2023-08-22 — End: 2023-09-06

## 2023-08-22 NOTE — Progress Notes (Signed)
 HIGH-RISK PREGNANCY VISIT Patient name: Heather [name] MRN [id number]  Date of birth: 04-Nov-1995 Chief Complaint:   Routine Prenatal Visit  History of Present Illness:   Labrea Eccleston is a 28 y.o. G3P0111 female at [redacted]w[redacted]d with an Estimated Date of Delivery: 11/24/23 being seen today for ongoing management of a high-risk pregnancy complicated by:  T2DM- Class F Patient still unable to obtain glucometer.  Per patient insurance states it is too early for the glucometer and pharmacy has not received the prior authorization - Patient also was not able to pick up a glucometer on her own - on metformin  1000mg  bid, glucose in office 117  Polysubstance use/OUD, Anxiety - Seems to be doing okay with Subutex  - Still taking Klonopin  3 times daily - Currently only on Zoloft  25 mg daily  Recurrent UTIs -Most recent urine culture completed by straight cath was negative for infection - Will plan to continue with Macrobid  for suppression  Trich/BV -Currently taking metronidazole   Today she reports no complaints.   Contractions: Not present. Vag. Bleeding: None.  Movement: Present. denies leaking of fluid.      05/24/2023    2:24 PM 10/18/2016   11:25 AM 10/04/2016    2:43 PM 02/02/2016    9:45 AM  Depression screen PHQ 2/9  Decreased Interest 1 0 0 0  Down, Depressed, Hopeless 0 0 0 0  PHQ - 2 Score 1 0 0 0  Altered sleeping 1     Tired, decreased energy 3     Change in appetite 0     Feeling bad or failure about yourself  1     Trouble concentrating 1     Moving slowly or fidgety/restless 0     Suicidal thoughts 0     PHQ-9 Score 7        Current Outpatient Medications  Medication Instructions   Accu-Chek Softclix Lancets lancets Use as instructed to check blood sugar 4 times daily   aspirin  EC 162 mg, Oral, Daily, Swallow whole.   Blood Glucose Monitoring Suppl (ACCU-CHEK GUIDE ME) w/Device KIT 1 each, Does not apply, 4 times daily   Blood Pressure Monitor MISC For regular home  bp monitoring during pregnancy   buprenorphine  (SUBUTEX ) 8 mg, Sublingual, 4 times daily   clonazePAM  (KLONOPIN ) 1 mg, Oral, 3 times daily   Continuous Glucose Sensor (DEXCOM G7 SENSOR) MISC 1 each, Does not apply, Daily, Change sensor every 10 days, please give 30 day supply. Patient is on insulin .   Doxylamine -Pyridoxine  (DICLEGIS ) 10-10 MG TBEC 2 tablets, Oral, Nightly   gabapentin  (NEURONTIN ) 800 mg, Oral, 3 times daily   glucose blood test strip Use as instructed to check blood sugar four times daily   hydrOXYzine  (ATARAX ) 10 mg, Oral, 3 times daily PRN   metFORMIN  (GLUCOPHAGE -XR) 1,000 mg, Oral, 2 times daily with meals   metroNIDAZOLE  (FLAGYL ) 500 mg, Oral, 2 times daily   nitrofurantoin  (macrocrystal-monohydrate) (MACROBID ) 100 mg, Oral, Daily   omeprazole  (PRILOSEC) 20 mg, Oral, Daily, 1 tablet a day   ondansetron  (ZOFRAN -ODT) 4 mg, Oral, Every 8 hours PRN   pantoprazole  (PROTONIX ) 20 mg, Oral, Daily   Prenatal Vit-Fe Fumarate-FA (PRENATAL VITAMIN PLUS LOW IRON ) 27-1 MG TABS 1 tablet, Oral, Daily   promethazine  (PHENERGAN ) 12.5-25 mg, Oral, Every 6 hours PRN   sertraline  (ZOLOFT ) 50 mg, Oral, Daily     Review of Systems:   Pertinent items are noted in HPI Denies abnormal vaginal discharge w/ itching/odor/irritation, headaches, visual changes, shortness of  breath, chest pain, abdominal pain, severe nausea/vomiting, or problems with urination or bowel movements unless otherwise stated above. Pertinent History Reviewed:  Reviewed past medical,surgical, social, obstetrical and family history.  Reviewed problem list, medications and allergies. Physical Assessment:   Vitals:   08/22/23 1205  BP: 122/73  Pulse: 85  Weight: 209 lb 3.2 oz (94.9 kg)  Body mass index is 34.81 kg/m.           Physical Examination:   General appearance: alert, well appearing, and in no distress  Mental status: normal mood, behavior, speech, dress, motor activity, and thought processes  Skin: warm &  dry   Extremities:  no edema or calf tenderness    Cardiovascular: normal heart rate noted  Respiratory: normal respiratory effort, no distress  Abdomen: gravid, soft, non-tender  Pelvic: Cervical exam deferred         Fetal Status:     Movement: Present    Fetal Surveillance Testing today: reech,anterior placenta gr 0,normal ovaries,anatomy of the spine complete,no obvious abnormalities of the spine,CX 2.8 cm,FHR 135 bpm,SVP of fluid 5 cm,EFW 931 g 32%,BPD 4%,HC 7%,AC 52%    Chaperone: N/A    Results for orders placed or performed in visit on 08/22/23 (from the past 24 hours)  POCT glucose (manual entry)   Collection Time: 08/22/23 12:32 PM  Result Value Ref Range   POC Glucose 117 (A) 70 - 99 mg/dl     Assessment & Plan:  High-risk pregnancy: H6E9888 at [redacted]w[redacted]d with an Estimated Date of Delivery: 11/24/23   1. Type 2 diabetes mellitus without complication, with long-term current use of insulin  (HCC) (Primary) -Plan to follow-up regarding prior authorization - Encouraged patient to get her own glucometer - At next visit we will plan for A1c -continue growth every 4 weeks -antepartum testing @ 32wks -likely plan for early delivery due to noncompliance  2. Supervision of high risk pregnancy in second trimester []  CBC, RPR at next visit  3. Non-compliance   4. Opioid use disorder -Plan to continue with Subutex   5. Depression with anxiety -Increase Zoloft  to 50 mg daily - Encourage patient to only take Klonopin  as needed ideally decreased to 1-2 times per day  6. BV/Trich - Currently being treated,[]  TOC next visit  7 HSV-2 infection    Meds:  Meds ordered this encounter  Medications   sertraline  (ZOLOFT ) 50 MG tablet    Sig: Take 1 tablet (50 mg total) by mouth daily.    Dispense:  30 tablet    Refill:  11    Labs/procedures today: growth scan/anatomy follow up  Treatment Plan:  as outlined above  Reviewed: Preterm labor symptoms and general obstetric  precautions including but not limited to vaginal bleeding, contractions, leaking of fluid and fetal movement were reviewed in detail with the patient.  All questions were answered.   Follow-up: Return in about 2 weeks (around 09/05/2023) for HROB visit and growth in 4 weeks.   Future Appointments  Date Time Provider Department Center  08/22/2023  1:30 PM Marilynn Nest, DO [facility]  09/06/2023 10:50 AM Jayne Vonn DEL, MD [facility]  09/20/2023  2:15 PM [facility] - [facility] US  [facility] None  09/20/2023  3:10 PM Eure, Vonn DEL, MD [facility]    Orders Placed This Encounter  Procedures   POCT glucose (manual entry)    Nest Marilynn, DO Attending Obstetrician & Gynecologist, [facility] for [facility], [facility]

## 2023-08-22 NOTE — Progress Notes (Signed)
 US  26+4 wks,breech,anterior placenta gr 0,normal ovaries,anatomy of the spine complete,no obvious abnormalities of the spine,CX 2.8 cm,FHR 135 bpm,SVP of fluid 5 cm,EFW 931 g 32%,BPD 4%,HC 7%,AC 52%

## 2023-08-23 ENCOUNTER — Encounter: Payer: Self-pay | Admitting: Obstetrics and Gynecology

## 2023-08-23 ENCOUNTER — Encounter: Payer: Self-pay | Admitting: Obstetrics & Gynecology

## 2023-08-23 DIAGNOSIS — E119 Type 2 diabetes mellitus without complications: Secondary | ICD-10-CM | POA: Insufficient documentation

## 2023-09-01 ENCOUNTER — Other Ambulatory Visit: Payer: Self-pay

## 2023-09-01 ENCOUNTER — Inpatient Hospital Stay (HOSPITAL_COMMUNITY): Payer: MEDICAID

## 2023-09-01 ENCOUNTER — Inpatient Hospital Stay (HOSPITAL_COMMUNITY)
Admission: AD | Admit: 2023-09-01 | Discharge: 2023-09-01 | Disposition: A | Discharge status: Home / Self Care | Payer: MEDICAID | Attending: Obstetrics and Gynecology | Admitting: Obstetrics and Gynecology

## 2023-09-01 ENCOUNTER — Encounter (HOSPITAL_COMMUNITY): Payer: Self-pay | Admitting: Emergency Medicine

## 2023-09-01 DIAGNOSIS — R109 Unspecified abdominal pain: Secondary | ICD-10-CM

## 2023-09-01 DIAGNOSIS — R1032 Left lower quadrant pain: Secondary | ICD-10-CM | POA: Insufficient documentation

## 2023-09-01 DIAGNOSIS — Z3A28 28 weeks gestation of pregnancy: Secondary | ICD-10-CM

## 2023-09-01 DIAGNOSIS — O26893 Other specified pregnancy related conditions, third trimester: Secondary | ICD-10-CM | POA: Diagnosis present

## 2023-09-01 DIAGNOSIS — R300 Dysuria: Secondary | ICD-10-CM

## 2023-09-01 DIAGNOSIS — Z7982 Long term (current) use of aspirin: Secondary | ICD-10-CM | POA: Insufficient documentation

## 2023-09-01 HISTORY — DX: Anxiety disorder, unspecified: F41.9

## 2023-09-01 HISTORY — DX: Depression, unspecified: F32.A

## 2023-09-01 HISTORY — DX: Unspecified infection of urinary tract in pregnancy, unspecified trimester: O23.40

## 2023-09-01 LAB — URINALYSIS, ROUTINE W REFLEX MICROSCOPIC
Bilirubin Urine: NEGATIVE
Glucose, UA: NEGATIVE mg/dL
Ketones, ur: NEGATIVE mg/dL
Nitrite: NEGATIVE
Protein, ur: 30 mg/dL — AB
Specific Gravity, Urine: 1.018 (ref 1.005–1.030)
Squamous Epithelial / HPF: >50 /HPF (ref 0–5)
WBC, UA: >50 WBC/hpf (ref 0–5)
pH: 7 (ref 5.0–8.0)

## 2023-09-01 LAB — CBC
HCT: 36.3 % (ref 36.0–46.0)
Hemoglobin: 11.8 g/dL — ABNORMAL LOW (ref 12.0–15.0)
MCH: 28.7 pg (ref 26.0–34.0)
MCHC: 32.5 g/dL (ref 30.0–36.0)
MCV: 88.3 fL (ref 80.0–100.0)
Platelets: 162 K/uL (ref 150–400)
RBC: 4.11 MIL/uL (ref 3.87–5.11)
RDW: 13.8 % (ref 11.5–15.5)
WBC: 6.7 K/uL (ref 4.0–10.5)
nRBC: 0 % (ref 0.0–0.2)

## 2023-09-01 LAB — COMPREHENSIVE METABOLIC PANEL WITH GFR
ALT: 9 U/L (ref 0–44)
AST: 11 U/L — ABNORMAL LOW (ref 15–41)
Albumin: 3 g/dL — ABNORMAL LOW (ref 3.5–5.0)
Alkaline Phosphatase: 51 U/L (ref 38–126)
Anion gap: 9 (ref 5–15)
BUN: 8 mg/dL (ref 6–20)
CO2: 21 mmol/L — ABNORMAL LOW (ref 22–32)
Calcium: 8.3 mg/dL — ABNORMAL LOW (ref 8.9–10.3)
Chloride: 106 mmol/L (ref 98–111)
Creatinine, Ser: 0.35 mg/dL — ABNORMAL LOW (ref 0.44–1.00)
GFR, Estimated: >60 mL/min (ref >60)
Glucose, Bld: 94 mg/dL (ref 70–99)
Potassium: 3.8 mmol/L (ref 3.5–5.1)
Sodium: 136 mmol/L (ref 135–145)
Total Bilirubin: 0.3 mg/dL (ref 0.0–1.2)
Total Protein: 6.8 g/dL (ref 6.5–8.1)

## 2023-09-01 LAB — WET PREP, GENITAL
Clue Cells Wet Prep HPF POC: NONE SEEN
Sperm: NONE SEEN
Trich, Wet Prep: NONE SEEN
WBC, Wet Prep HPF POC: >=10 — AB (ref <10)
Yeast Wet Prep HPF POC: NONE SEEN

## 2023-09-01 LAB — LIPASE, BLOOD: Lipase: 25 U/L (ref 11–51)

## 2023-09-01 LAB — CBG MONITORING, ED: Glucose-Capillary: 94 mg/dL (ref 70–99)

## 2023-09-01 MED ORDER — PHENAZOPYRIDINE HCL 200 MG PO TABS: 200.0000 mg | ORAL_TABLET | Freq: Three times a day (TID) | ORAL | 0 refills | Status: DC

## 2023-09-01 MED ORDER — SODIUM CHLORIDE 0.9 % IV BOLUS
1000.0000 mL | Freq: Once | INTRAVENOUS | Status: AC
  Administered 2023-09-01: 1000 mL via INTRAVENOUS

## 2023-09-01 MED ORDER — ONDANSETRON HCL 4 MG/2ML IJ SOLN
4.0000 mg | Freq: Once | INTRAMUSCULAR | Status: AC
  Administered 2023-09-01: 4 mg via INTRAVENOUS
  Filled 2023-09-01: qty 2

## 2023-09-01 MED ORDER — ACETAMINOPHEN 325 MG PO TABS
650.0000 mg | ORAL_TABLET | Freq: Once | ORAL | Status: AC
  Administered 2023-09-01: 650 mg via ORAL
  Filled 2023-09-01: qty 2

## 2023-09-01 MED ORDER — BUPRENORPHINE HCL 8 MG SL SUBL
8.0000 mg | SUBLINGUAL_TABLET | Freq: Once | SUBLINGUAL | Status: AC
  Administered 2023-09-01: 8 mg via SUBLINGUAL
  Filled 2023-09-01: qty 1

## 2023-09-01 MED ORDER — CYCLOBENZAPRINE HCL 5 MG PO TABS
10.0000 mg | ORAL_TABLET | Freq: Once | ORAL | Status: AC
  Administered 2023-09-01: 10 mg via ORAL
  Filled 2023-09-01: qty 2

## 2023-09-01 MED ORDER — ONDANSETRON HCL 4 MG/2ML IJ SOLN
4.0000 mg | Freq: Once | INTRAMUSCULAR | Status: AC | PRN
  Administered 2023-09-01: 4 mg via INTRAVENOUS
  Filled 2023-09-01: qty 2

## 2023-09-01 MED ORDER — PHENAZOPYRIDINE HCL 100 MG PO TABS
200.0000 mg | ORAL_TABLET | Freq: Once | ORAL | Status: AC
  Administered 2023-09-01: 200 mg via ORAL
  Filled 2023-09-01: qty 2

## 2023-09-01 NOTE — ED Notes (Signed)
 [facility] here to transport pt to [facility]

## 2023-09-01 NOTE — ED Triage Notes (Signed)
 Pt presents [redacted] weeks pregnant with lower abdominal pain and dysuria, per pt bladder is not fully emptying, currently on Flagyl  and Macrodantin  for UTI.

## 2023-09-01 NOTE — ED Notes (Signed)
 Pts states I need to pee but I can't bladder scanner revealed of urine in bladder--PA-C made aware

## 2023-09-01 NOTE — ED Provider Notes (Signed)
 [facility] AT [facility] Provider Note   CSN: [id number] Arrival date & time: 09/01/23  1049     Patient presents with: Abdominal Pain ([redacted] wks Pregnant) and Dysuria   Heather [name] is a 28 y.o. female.  She is [redacted] weeks pregnant.  Complains of burning with urination, blood in her urine, left side pelvic and flank pain this been going on for weeks and has been worse the past several days.  She has been on Macrobid  without relief.  She denies fever or chills.  She does admit to nausea.  She also states that she is not able to fully empty her bladder and is not sure if she is leaking urine on herself or having vaginal fluid leakage.    Abdominal Pain Associated symptoms: dysuria   Dysuria Associated symptoms: abdominal pain        Prior to Admission medications   Medication Sig Start Date End Date Taking? Authorizing Provider  Accu-Chek Softclix Lancets lancets Use as instructed to check blood sugar 4 times daily Patient not taking: Reported on 08/22/2023 03/28/23   Ozan, Jennifer, DO  aspirin  EC 81 MG tablet Take 2 tablets (162 mg total) by mouth daily. Swallow whole. Patient not taking: Reported on 08/22/2023 05/24/23   Kizzie Suzen SAUNDERS, CNM  Blood Glucose Monitoring Suppl (ACCU-CHEK GUIDE ME) w/Device KIT 1 each by Does not apply route 4 (four) times daily. Patient not taking: Reported on 08/22/2023 03/28/23   Ozan, Jennifer, DO  Blood Pressure Monitor MISC For regular home bp monitoring during pregnancy Patient not taking: Reported on 08/22/2023 05/04/23   Ozan, Jennifer, DO  buprenorphine  (SUBUTEX ) 8 MG SUBL SL tablet Place 1 tablet (8 mg total) under the tongue 4 (four) times daily for 14 days. 08/22/23 09/05/23  Ozan, Jennifer, DO  clonazePAM  (KLONOPIN ) 1 MG tablet Take 1 tablet (1 mg total) by mouth 3 (three) times daily for 14 days. 08/22/23 09/05/23  Ozan, Jennifer, DO  Continuous Glucose Sensor (DEXCOM G7 SENSOR) MISC 1 each by Does not apply route  daily at 6 (six) AM. Change sensor every 10 days, please give 30 day supply. Patient is on insulin . Patient not taking: Reported on 08/22/2023 07/28/23   Rasch, Delon I, NP  Doxylamine -Pyridoxine  (DICLEGIS ) 10-10 MG TBEC Take 2 tablets by mouth at bedtime. 05/04/23   Ozan, Jennifer, DO  gabapentin  (NEURONTIN ) 800 MG tablet Take 1 tablet (800 mg total) by mouth 3 (three) times daily. 05/26/23   Jayne Vonn DEL, MD  glucose blood test strip Use as instructed to check blood sugar four times daily Patient not taking: Reported on 08/22/2023 03/28/23   Ozan, Jennifer, DO  hydrOXYzine  (ATARAX ) 10 MG tablet Take 1 tablet (10 mg total) by mouth 3 (three) times daily as needed. Patient not taking: Reported on 08/22/2023 03/28/23   Ozan, Jennifer, DO  metFORMIN  (GLUCOPHAGE -XR) 500 MG 24 hr tablet Take 2 tablets (1,000 mg total) by mouth 2 (two) times daily with a meal. 03/28/23 05/30/25  Ozan, Jennifer, DO  nitrofurantoin , macrocrystal-monohydrate, (MACROBID ) 100 MG capsule Take 1 capsule (100 mg total) by mouth daily. 08/11/23 11/09/23  Ozan, Jennifer, DO  omeprazole  (PRILOSEC) 20 MG capsule Take 1 capsule (20 mg total) by mouth daily. 1 tablet a day 05/31/23   Jayne Vonn DEL, MD  ondansetron  (ZOFRAN -ODT) 4 MG disintegrating tablet Take 1 tablet (4 mg total) by mouth every 8 (eight) hours as needed for nausea or vomiting. 05/18/23   Ozan, Jennifer, DO  pantoprazole  (PROTONIX )  20 MG tablet Take 1 tablet (20 mg total) by mouth daily. 05/24/23   Kizzie Suzen SAUNDERS, CNM  Prenatal Vit-Fe Fumarate-FA (PRENATAL VITAMIN PLUS LOW IRON ) 27-1 MG TABS Take 1 tablet by mouth daily at 6 (six) AM. 03/28/23   Ozan, Jennifer, DO  promethazine  (PHENERGAN ) 25 MG tablet Take 0.5-1 tablets (12.5-25 mg total) by mouth every 6 (six) hours as needed. 06/07/23   Kizzie Suzen SAUNDERS, CNM  sertraline  (ZOLOFT ) 50 MG tablet Take 1 tablet (50 mg total) by mouth daily. 08/22/23 09/21/23  Ozan, Jennifer, DO    Allergies: Prozac [fluoxetine hcl],  Cephalosporins, and Latuda [lurasidone hcl]    Review of Systems  Gastrointestinal:  Positive for abdominal pain.  Genitourinary:  Positive for dysuria.    Updated Vital Signs BP 108/70   Pulse 90   Temp 98.3 F (36.8 C) (Oral)   Resp 18   Ht 5' 5 (1.651 m)   Wt 93.4 kg   LMP  (LMP Unknown)   SpO2 97%   BMI 34.28 kg/m   Physical Exam Vitals and nursing note reviewed.  Constitutional:      General: She is not in acute distress.    Appearance: She is well-developed.  HENT:     Head: Normocephalic and atraumatic.  Eyes:     Conjunctiva/sclera: Conjunctivae normal.  Cardiovascular:     Rate and Rhythm: Normal rate and regular rhythm.     Heart sounds: No murmur heard. Pulmonary:     Effort: Pulmonary effort is normal. No respiratory distress.     Breath sounds: Normal breath sounds.  Abdominal:     Palpations: Abdomen is soft.     Tenderness: There is abdominal tenderness in the left lower quadrant. There is left CVA tenderness. There is no guarding or rebound.  Musculoskeletal:        General: No swelling.     Cervical back: Neck supple.  Skin:    General: Skin is warm and dry.     Capillary Refill: Capillary refill takes less than 2 seconds.  Neurological:     General: No focal deficit present.     Mental Status: She is alert and oriented to person, place, and time.  Psychiatric:        Mood and Affect: Mood normal.     (all labs ordered are listed, but only abnormal results are displayed) Labs Reviewed  LIPASE, BLOOD  COMPREHENSIVE METABOLIC PANEL WITH GFR  CBC  URINALYSIS, ROUTINE W REFLEX MICROSCOPIC  CBG MONITORING, ED    EKG: None  Radiology: No results found.   Procedures   Medications Ordered in the ED  ondansetron  (ZOFRAN ) injection 4 mg (has no administration in time range)  acetaminophen  (TYLENOL ) tablet 650 mg (has no administration in time range)                                    Medical Decision Making Ddx: UTI,  pyelonephritis, ureterolithiasis, vaginitis, other  ED course: Patient was having some left-sided pelvic and flank pain.  She is currently pregnant at [redacted] weeks.  She is [id number].  She called her doctor's office today due to continued dysuria and hematuria despite being on Macrobid  for the past several weeks.  She was advised to go to MAU but did not have a ride so was instructed to come here for transfer.  Consult:  I spoke with Dr. Eldonna who is on-call for OB/GYN.  She recommends transfer to MAU for further workup for possible pyelonephritis versus kidney stone.  Patient is agreeable with transfer, and is stable for transfer at this time.  Amount and/or Complexity of Data Reviewed Labs: ordered.  Risk OTC drugs. Prescription drug management.        Final diagnoses:  None    ED Discharge Orders     None          Suellen Sherran DELENA DEVONNA 09/01/23 1207    Francesca Elsie CROME, MD 09/01/23 1610

## 2023-09-01 NOTE — ED Notes (Signed)
Called [facility] for transfer  

## 2023-09-01 NOTE — MAU Provider Note (Signed)
 History     CSN: [id number]  Arrival date and time: 09/01/23 1049   Event Date/Time   First Provider Initiated Contact with Patient 09/01/23 1428      Chief Complaint  Patient presents with   Abdominal Pain    [redacted] wks Pregnant   Dysuria   unable to void   HPI Heather [name] is a 28 y.o. [id number] at [redacted]w[redacted]d who presents for abdominal cramping, dysuria, & flank pain. Symptoms have been intermittent for the last month. Current symptoms worsened last night. On suppression for recurrent UTIs.  Reports n/v since this morning with dysuria, urgency, flank pain, & hematuria. Denies fever/chills, LOF, or vaginal bleeding. Reports good fetal movement.   OB History     Gravida  3   Para  1   Term      Preterm  1   AB  1   Living  1      SAB  1   IAB      Ectopic      Multiple      Live Births  1        Obstetric Comments  2017 started contracting at 44 wks, PPROM at 53wks, [facility]         Past Medical History:  Diagnosis Date   ADHD (attention deficit hyperactivity disorder)    Anemia    Anxiety    Asthma    as a child   Bipolar 1 disorder (HCC)    Depression    Diabetes mellitus    type 2   Diabetic neuropathy (HCC)    Fatty liver    GHTN vs. 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 with I and D in office and ER   TONSILLECTOMY     WISDOM TOOTH EXTRACTION      Family History  Problem Relation Age of Onset    Diabetes Mother    Hypertension Mother    Bipolar disorder Father    Asthma Sister        intrinsic   Bipolar disorder Brother    Anxiety disorder Brother    Stroke Maternal Grandmother    Cancer Other        colon, lung, brain-paternal side    Social History   Tobacco Use   Smoking status: Never   Smokeless tobacco: Never  Vaping Use   Vaping status: Never Used  Substance Use Topics   Alcohol use: No   Drug use: Not Currently    Types: Oxycodone , Hydrocodone , Fentanyl     Comment: prescibed meds only    Allergies:  Allergies  Allergen Reactions   Prozac [Fluoxetine Hcl]     Stiffness in neck, stroke like symptoms   Cephalosporins Other (See Comments)    Unknown childhood reaction   Latuda [Lurasidone Hcl]  Tongue, confusion     Medications Prior to Admission  Medication Sig Dispense Refill Last Dose/Taking   buprenorphine  (SUBUTEX ) 8 MG SUBL SL tablet Place 1 tablet (8 mg total) under the tongue 4 (four) times daily for 14 days. 60 tablet 0 09/01/2023 at  7:00 AM   clonazePAM  (KLONOPIN ) 1 MG tablet Take 1 tablet (1 mg total) by mouth 3 (three) times daily for 14 days. 42 tablet 0 09/01/2023 at  7:00 AM   gabapentin  (NEURONTIN ) 800 MG tablet Take 1 tablet (800 mg total) by mouth 3 (three) times daily. 90 tablet 6 09/01/2023 at  7:00 AM   metFORMIN  (GLUCOPHAGE -XR) 500 MG 24 hr tablet Take 2 tablets (1,000 mg total) by mouth 2 (two) times daily with a meal. 120 tablet 11 09/01/2023 at  7:00 AM   nitrofurantoin , macrocrystal-monohydrate, (MACROBID ) 100 MG capsule Take 1 capsule (100 mg total) by mouth daily. 90 capsule 2 09/01/2023 at  7:00 AM   omeprazole  (PRILOSEC) 20 MG capsule Take 1 capsule (20 mg total) by mouth daily. 1 tablet a day 30 capsule 6 09/01/2023 at  7:00 AM   Prenatal Vit-Fe Fumarate-FA (PRENATAL VITAMIN PLUS LOW IRON ) 27-1 MG TABS Take 1 tablet by mouth daily at 6 (six) AM. 90 tablet 4 09/01/2023 Morning   sertraline  (ZOLOFT ) 50 MG tablet Take 1 tablet (50  mg total) by mouth daily. 30 tablet 11 09/01/2023 at  7:00 AM   Accu-Chek Softclix Lancets lancets Use as instructed to check blood sugar 4 times daily (Patient not taking: No sig reported) 100 each 12 Not Taking   aspirin  EC 81 MG tablet Take 2 tablets (162 mg total) by mouth daily. Swallow whole. (Patient not taking: Reported on 08/22/2023) 180 tablet 2    Blood Glucose Monitoring Suppl (ACCU-CHEK GUIDE ME) w/Device KIT 1 each by Does not apply route 4 (four) times daily. (Patient not taking: Reported on 08/22/2023) 1 kit 0    Blood Pressure Monitor MISC For regular home bp monitoring during pregnancy (Patient not taking: Reported on 08/22/2023) 1 each 0    Continuous Glucose Sensor (DEXCOM G7 SENSOR) MISC 1 each by Does not apply route daily at 6 (six) AM. Change sensor every 10 days, please give 30 day supply. Patient is on insulin . (Patient not taking: Reported on 08/22/2023) 3 each 3    Doxylamine -Pyridoxine  (DICLEGIS ) 10-10 MG TBEC Take 2 tablets by mouth at bedtime. 60 tablet 3    glucose blood test strip Use as instructed to check blood sugar four times daily (Patient not taking: Reported on 08/22/2023) 100 each 12    hydrOXYzine  (ATARAX ) 10 MG tablet Take 1 tablet (10 mg total) by mouth 3 (three) times daily as needed. (Patient not taking: No sig reported) 30 tablet 6 Not Taking   ondansetron  (ZOFRAN -ODT) 4 MG disintegrating tablet Take 1 tablet (4 mg total) by mouth every 8 (eight) hours as needed for nausea or vomiting. 20 tablet 1    pantoprazole  (PROTONIX ) 20 MG tablet Take 1 tablet (20 mg total) by mouth daily. 30 tablet 6    promethazine  (PHENERGAN ) 25 MG tablet Take 0.5-1 tablets (12.5-25 mg total) by mouth every 6 (six) hours as needed. 30 tablet 6     Review of Systems  All other systems reviewed and are negative.  Physical Exam   Blood pressure (!) 109/50, pulse 79, temperature 99.8 F (37.7 C), temperature source Oral, resp. rate 15, height 5' 5 (1.651 m), weight 93.4 kg, SpO2  97%.  Physical  Exam Vitals and nursing note reviewed.  Constitutional:      General: She is not in acute distress.    Appearance: She is well-developed. She is not ill-appearing.  HENT:     Head: Normocephalic and atraumatic.  Eyes:     General: No scleral icterus.       Right eye: No discharge.        Left eye: No discharge.     Conjunctiva/sclera: Conjunctivae normal.  Pulmonary:     Effort: Pulmonary effort is normal. No respiratory distress.  Abdominal:     Tenderness: There is left CVA tenderness. There is no right CVA tenderness.  Genitourinary:    Comments: Dilation: Closed Exam by:: Jerilynn NP  Neurological:     General: No focal deficit present.     Mental Status: She is alert.  Psychiatric:        Mood and Affect: Mood normal.        Behavior: Behavior normal.    NST:  Baseline: 130 bpm, Variability: Good {> 6 bpm), Accelerations: Reactive, and Decelerations: Absent  MAU Course  Procedures Results for orders placed or performed during the hospital encounter of 09/01/23 (from the past 24 hours)  POC CBG, ED     Status: None   Collection Time: 09/01/23 11:58 AM  Result Value Ref Range   Glucose-Capillary 94 70 - 99 mg/dL  Lipase, blood     Status: None   Collection Time: 09/01/23 12:04 PM  Result Value Ref Range   Lipase 25 11 - 51 U/L  Comprehensive metabolic panel     Status: Abnormal   Collection Time: 09/01/23 12:04 PM  Result Value Ref Range   Sodium 136 135 - 145 mmol/L   Potassium 3.8 3.5 - 5.1 mmol/L   Chloride 106 98 - 111 mmol/L   CO2 21 (L) 22 - 32 mmol/L   Glucose, Bld 94 70 - 99 mg/dL   BUN 8 6 - 20 mg/dL   Creatinine, Ser 9.64 (L) 0.44 - 1.00 mg/dL   Calcium  8.3 (L) 8.9 - 10.3 mg/dL   Total Protein 6.8 6.5 - 8.1 g/dL   Albumin 3.0 (L) 3.5 - 5.0 g/dL   AST 11 (L) 15 - 41 U/L   ALT 9 0 - 44 U/L   Alkaline Phosphatase 51 38 - 126 U/L   Total Bilirubin 0.3 0.0 - 1.2 mg/dL   GFR, Estimated >39 >39 mL/min   Anion gap 9 5 - 15  CBC      Status: Abnormal   Collection Time: 09/01/23 12:04 PM  Result Value Ref Range   WBC 6.7 4.0 - 10.5 K/uL   RBC 4.11 3.87 - 5.11 MIL/uL   Hemoglobin 11.8 (L) 12.0 - 15.0 g/dL   HCT 63.6 63.9 - 53.9 %   MCV 88.3 80.0 - 100.0 fL   MCH 28.7 26.0 - 34.0 pg   MCHC 32.5 30.0 - 36.0 g/dL   RDW 86.1 88.4 - 84.4 %   Platelets 162 150 - 400 K/uL   nRBC 0.0 0.0 - 0.2 %  Urinalysis, Routine w reflex microscopic -Urine, Clean Catch     Status: Abnormal   Collection Time: 09/01/23  3:17 PM  Result Value Ref Range   Color, Urine YELLOW YELLOW   APPearance CLOUDY (A) CLEAR   Specific Gravity, Urine 1.018 1.005 - 1.030   pH 7.0 5.0 - 8.0   Glucose, UA NEGATIVE NEGATIVE mg/dL   Hgb urine dipstick SMALL (A) NEGATIVE  Bilirubin Urine NEGATIVE NEGATIVE   Ketones, ur NEGATIVE NEGATIVE mg/dL   Protein, ur 30 (A) NEGATIVE mg/dL   Nitrite NEGATIVE NEGATIVE   Leukocytes,Ua LARGE (A) NEGATIVE   RBC / HPF 6-10 0 - 5 RBC/hpf   WBC, UA >50 0 - 5 WBC/hpf   Bacteria, UA FEW (A) NONE SEEN   Squamous Epithelial / HPF >50 0 - 5 /HPF   Mucus PRESENT    Non Squamous Epithelial 0-5 (A) NONE SEEN  Culture, OB Urine     Status: None (Preliminary result)   Collection Time: 09/01/23  3:17 PM   Specimen: Urine, Random  Result Value Ref Range   Specimen Description URINE, RANDOM    Special Requests      NONE Performed at [facility], [address] [place], [place] [address]    Culture PENDING    Report Status PENDING   Wet prep, genital     Status: Abnormal   Collection Time: 09/01/23  6:00 PM   Specimen: Vaginal  Result Value Ref Range   Yeast Wet Prep HPF POC NONE SEEN NONE SEEN   Trich, Wet Prep NONE SEEN NONE SEEN   Clue Cells Wet Prep HPF POC NONE SEEN NONE SEEN   WBC, Wet Prep HPF POC >=10 (A) <10   Sperm NONE SEEN    CT Renal Stone Study Result Date: 09/01/2023 CLINICAL DATA:  Flank pain EXAM: CT ABDOMEN AND PELVIS WITHOUT CONTRAST TECHNIQUE: Multidetector CT imaging of the abdomen and  pelvis was performed following the standard protocol without IV contrast. RADIATION DOSE REDUCTION: This exam was performed according to the departmental dose-optimization program which includes automated exposure control, adjustment of the mA and/or kV according to patient size and/or use of iterative reconstruction technique. COMPARISON:  Ultrasound 09/01/2023, CT 02/23/2020 report FINDINGS: Lower chest: Trace pleural effusions. Hepatobiliary: No focal liver abnormality is seen. No gallstones, gallbladder wall thickening, or biliary dilatation. Pancreas: Unremarkable. No pancreatic ductal dilatation or surrounding inflammatory changes. Spleen: Appears slightly enlarged, AP measurement of 16 cm. Adrenals/Urinary Tract: Adrenal glands are normal. Mild right hydronephrosis. No intrarenal stones. No ureteral stone. Urinary bladder is unremarkable Stomach/Bowel: The stomach is nonenlarged. No dilated small bowel. No acute bowel wall thickening. Possible normal appendix on coronal series 6, image 57. No right lower quadrant inflammation Vascular/Lymphatic: No significant vascular findings are present. No enlarged abdominal or pelvic lymph nodes. Reproductive: Single intrauterine gestation. Other: Negative for pelvic effusion or free air Musculoskeletal: No acute or suspicious osseous abnormality. IMPRESSION: 1. Mild right hydronephrosis without ureteral stone. No intrarenal calculi. 2. Single intrauterine gestation. 3. Trace pleural effusions. 4. The spleen appears slightly enlarged. Electronically Signed   By: Luke Bun M.D.   On: 09/01/2023 19:45   US  RENAL Result Date: 09/01/2023 CLINICAL DATA:  Left flank pain.  Patient is [redacted] weeks pregnant. EXAM: RENAL / URINARY TRACT ULTRASOUND COMPLETE COMPARISON:  CT abdomen pelvis 10/20/2014 FINDINGS: Right Kidney: Renal measurements: 15.4 x 5.6 x 7 cm = volume: 318 mL. Moderate hydronephrosis, likely related to extrinsic compression due to the pregnancy. Normal  parenchymal echotexture and thickness. No focal mass lesions identified. Left Kidney: Renal measurements: 15.3 x 6.2 x 5.1 cm = volume: 255 mL. Echogenicity within normal limits. No mass or hydronephrosis visualized. Bladder: Appears normal for degree of bladder distention. Other: None. IMPRESSION: 1. Moderate hydronephrosis of the right kidney likely due to extrinsic compression from the pregnancy. No focal lesions. 2. Normal ultrasound appearance of the left kidney and bladder.  Electronically Signed   By: Elsie Gravely M.D.   On: 09/01/2023 17:03    MDM Transfer from [facility] for evaluation of flank pain & dysuria. Has some left cvat. Afebrile & no leukocytosis. Had +urine culture 7/28 per care everywhere with negative culture the beginning of this month. Has been taking macrobid  for suppression.  Urine culture collected. Plan to continue suppression for now, add treatment depending on results. Rx pyridium  for symptoms.   Renal u/s & CT negative for nephrolithiasis  Hx of trich earlier this month that was treated. TOC for trichomonas is negative today.   TOCO quiet. Cervix closed/thick/-3   Assessment and Plan   1. Abdominal pain, unspecified abdominal location   2. Dysuria during pregnancy in third trimester   3. [redacted] weeks gestation of pregnancy    -Urine culture pending -Continue macrobid  -Rx pyridium  -Next appt 9/2 -Reviewed return precautions  Rocky Satterfield 09/01/2023, 2:28 PM

## 2023-09-01 NOTE — MAU Note (Signed)
 Heather [name] is a 28 y.o. at [redacted]w[redacted]d here in MAU reporting: hasn't peed in 2 days, just dripping, looks bloody'. Pt is on suppression for recurrent UTI.  Having a  lot of pain in her vagina and lower belly. Is nauseated.  Having pain that starts left flank, wraps around to below belly.  A lot of pressure.  Onset of complaint: last 2 days., last hour she couldn't even stand before she went in  Pain score: flank 9, pain with urge to pee- 10 Vitals:   09/01/23 1343 09/01/23 1344  BP: (!) 112/48   Pulse: 81   Resp: 15   Temp: 99.8 F (37.7 C)   SpO2:  98%     FHT:135 Lab orders placed from triage:

## 2023-09-03 LAB — CULTURE, OB URINE: Culture: >=100000 — AB

## 2023-09-04 ENCOUNTER — Other Ambulatory Visit: Payer: Self-pay | Admitting: Obstetrics and Gynecology

## 2023-09-04 ENCOUNTER — Encounter (HOSPITAL_COMMUNITY): Payer: Self-pay | Admitting: Obstetrics & Gynecology

## 2023-09-04 ENCOUNTER — Other Ambulatory Visit: Payer: Self-pay

## 2023-09-04 ENCOUNTER — Inpatient Hospital Stay (HOSPITAL_COMMUNITY)
Admission: AD | Admit: 2023-09-04 | Discharge: 2023-09-05 | Disposition: A | Discharge status: Home / Self Care | Payer: MEDICAID | Source: Ambulatory Visit | Attending: Obstetrics & Gynecology | Admitting: Obstetrics & Gynecology

## 2023-09-04 DIAGNOSIS — Z794 Long term (current) use of insulin: Secondary | ICD-10-CM

## 2023-09-04 DIAGNOSIS — B9620 Unspecified Escherichia coli [E. coli] as the cause of diseases classified elsewhere: Secondary | ICD-10-CM | POA: Diagnosis not present

## 2023-09-04 DIAGNOSIS — Z3A28 28 weeks gestation of pregnancy: Secondary | ICD-10-CM | POA: Insufficient documentation

## 2023-09-04 DIAGNOSIS — O2303 Infections of kidney in pregnancy, third trimester: Secondary | ICD-10-CM | POA: Diagnosis not present

## 2023-09-04 DIAGNOSIS — O0992 Supervision of high risk pregnancy, unspecified, second trimester: Secondary | ICD-10-CM

## 2023-09-04 DIAGNOSIS — R300 Dysuria: Secondary | ICD-10-CM | POA: Diagnosis present

## 2023-09-04 DIAGNOSIS — O2343 Unspecified infection of urinary tract in pregnancy, third trimester: Secondary | ICD-10-CM | POA: Insufficient documentation

## 2023-09-04 LAB — CBC
HCT: 35.8 % — ABNORMAL LOW (ref 36.0–46.0)
Hemoglobin: 11.6 g/dL — ABNORMAL LOW (ref 12.0–15.0)
MCH: 28.5 pg (ref 26.0–34.0)
MCHC: 32.4 g/dL (ref 30.0–36.0)
MCV: 88 fL (ref 80.0–100.0)
Platelets: 178 K/uL (ref 150–400)
RBC: 4.07 MIL/uL (ref 3.87–5.11)
RDW: 13.8 % (ref 11.5–15.5)
WBC: 8.7 K/uL (ref 4.0–10.5)
nRBC: 0 % (ref 0.0–0.2)

## 2023-09-04 MED ORDER — CEFADROXIL 500 MG PO CAPS: 500.0000 mg | ORAL_CAPSULE | Freq: Two times a day (BID) | ORAL | 0 refills | Status: DC

## 2023-09-04 MED ORDER — SODIUM CHLORIDE 0.9 % IV SOLN
2.0000 g | INTRAVENOUS | Status: DC
  Administered 2023-09-04: 2 g via INTRAVENOUS
  Filled 2023-09-04: qty 20

## 2023-09-04 MED ORDER — SODIUM CHLORIDE 0.9 % IV BOLUS
1000.0000 mL | Freq: Once | INTRAVENOUS | Status: AC
  Administered 2023-09-04: 1000 mL via INTRAVENOUS

## 2023-09-04 MED ORDER — METOCLOPRAMIDE HCL 5 MG/ML IJ SOLN
10.0000 mg | Freq: Once | INTRAMUSCULAR | Status: AC
  Administered 2023-09-04: 10 mg via INTRAVENOUS
  Filled 2023-09-04: qty 2

## 2023-09-04 MED ORDER — DIPHENHYDRAMINE HCL 50 MG/ML IJ SOLN
25.0000 mg | Freq: Once | INTRAMUSCULAR | Status: AC
  Administered 2023-09-04: 25 mg via INTRAVENOUS
  Filled 2023-09-04: qty 1

## 2023-09-04 MED ORDER — LACTATED RINGERS IV BOLUS
1000.0000 mL | Freq: Once | INTRAVENOUS | Status: AC
  Administered 2023-09-04: 1000 mL via INTRAVENOUS

## 2023-09-04 NOTE — Progress Notes (Signed)
+   urine culture.  Patient called and notified of results.  RX: Duricef x 7 days.   Dorita Delon FERNS, NP 09/04/2023 7:25 PM

## 2023-09-04 NOTE — MAU Note (Signed)
 Heather [name] is a 28 y.o. at [redacted]w[redacted]d here in MAU reporting: ongoing nausea and vomiting since 8/28; unable to keep any liquid or solids down. Patient states she was told her previous results from MAU visit on 8/28 that her urine was positive for E.coli. Has not picked up prescription for Pyridium , but has been taking the Macrobid  daily. Reports being unable to urinate, that urine is only dripping out of her and that it is bloody and smells like feces. Also having lower abdominal and lower back pain that started around 4-5 days ago.  Onset of complaint: 8/28 Pain score: 8/10 - lower abdomen and lower back Vitals:   09/04/23 2135 09/04/23 2137  BP:  (!) 117/59  Pulse:  89  Resp:  16  Temp:  97.9 F (36.6 C)  SpO2: 99%      FHT: 147bpm Lab orders placed from triage:

## 2023-09-05 DIAGNOSIS — Z3A28 28 weeks gestation of pregnancy: Secondary | ICD-10-CM

## 2023-09-05 DIAGNOSIS — O2303 Infections of kidney in pregnancy, third trimester: Secondary | ICD-10-CM

## 2023-09-05 LAB — COMPREHENSIVE METABOLIC PANEL WITH GFR
ALT: 8 U/L (ref 0–44)
AST: 12 U/L — ABNORMAL LOW (ref 15–41)
Albumin: 2.8 g/dL — ABNORMAL LOW (ref 3.5–5.0)
Alkaline Phosphatase: 52 U/L (ref 38–126)
Anion gap: 10 (ref 5–15)
BUN: 8 mg/dL (ref 6–20)
CO2: 22 mmol/L (ref 22–32)
Calcium: 8.5 mg/dL — ABNORMAL LOW (ref 8.9–10.3)
Chloride: 106 mmol/L (ref 98–111)
Creatinine, Ser: 0.35 mg/dL — ABNORMAL LOW (ref 0.44–1.00)
GFR, Estimated: >60 mL/min (ref >60)
Glucose, Bld: 93 mg/dL (ref 70–99)
Potassium: 3.3 mmol/L — ABNORMAL LOW (ref 3.5–5.1)
Sodium: 138 mmol/L (ref 135–145)
Total Bilirubin: 0.2 mg/dL (ref 0.0–1.2)
Total Protein: 6.4 g/dL — ABNORMAL LOW (ref 6.5–8.1)

## 2023-09-05 MED ORDER — METOCLOPRAMIDE HCL 10 MG PO TABS: 10.0000 mg | ORAL_TABLET | Freq: Three times a day (TID) | ORAL | 0 refills | Status: DC

## 2023-09-05 MED ORDER — ONDANSETRON HCL 4 MG/2ML IJ SOLN: 4.0000 mg | Freq: Once | INTRAMUSCULAR | Status: DC

## 2023-09-05 NOTE — MAU Provider Note (Signed)
 History     CSN: [id number]  Arrival date and time: 09/04/23 2107   Event Date/Time   First Provider Initiated Contact with Patient 09/04/23 2159      Chief Complaint  Patient presents with   Nausea   Abdominal Pain   Emesis   Back Pain   Patient is a 28 y/o G3P0111 at [redacted]w[redacted]d who presents to MAU due to pain with urination, difficulty urinating and flank pain. She was initially seen in the ED on 8/28 for flank pain and dysuria and transferred to MAU. Urine culture obtained. Of note, patient is chronically on macrobid  for UTI suppression. UTI symptoms got worse over the next couple of days. Urine culture came back with E coli today and duracef was sent. Unfortunately patient was unable to pick this up. She came in with dehydration, inability to tolerate PO intake and urinary retention. She remains afebrile. She denies chills. She denies any other associated symptoms. Denies vaginal bleeding/discharge, leakage of fluid or contractions.    Past Medical History:  Diagnosis Date   ADHD (attention deficit hyperactivity disorder)    Anemia    Anxiety    Asthma    as a child   Bipolar 1 disorder (HCC)    Depression    Diabetes mellitus    type 2   Diabetic neuropathy (HCC)    Fatty liver    GHTN vs. 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 with I and D in office and ER   TONSILLECTOMY     WISDOM TOOTH EXTRACTION      Family History  Problem  Relation Age of Onset   Diabetes Mother    Hypertension Mother    Bipolar disorder Father    Asthma Sister        intrinsic   Bipolar disorder Brother    Anxiety disorder Brother    Stroke Maternal Grandmother    Cancer Other        colon, lung, brain-paternal side    Social History   Tobacco Use   Smoking status: Never   Smokeless tobacco: Never  Vaping Use   Vaping status: Never Used  Substance Use Topics   Alcohol use: Not Currently   Drug use: Not Currently    Types: Oxycodone , Hydrocodone , Fentanyl     Comment: prescibed meds only    Allergies:  Allergies  Allergen Reactions   Prozac [Fluoxetine Hcl]     Stiffness in neck, stroke like symptoms   Cephalosporins Other (See Comments)    Unknown childhood reaction; states took last month at the hospital with benadryl  and was okay.   Latuda [Lurasidone Hcl]     Tongue swelling, confusion     Medications Prior to Admission  Medication Sig Dispense Refill Last Dose/Taking   buprenorphine  (SUBUTEX ) 8 MG SUBL SL tablet  Place 1 tablet (8 mg total) under the tongue 4 (four) times daily for 14 days. 60 tablet 0 09/04/2023   clonazePAM  (KLONOPIN ) 1 MG tablet Take 1 tablet (1 mg total) by mouth 3 (three) times daily for 14 days. 42 tablet 0 09/04/2023   gabapentin  (NEURONTIN ) 800 MG tablet Take 1 tablet (800 mg total) by mouth 3 (three) times daily. 90 tablet 6 09/04/2023   metFORMIN  (GLUCOPHAGE -XR) 500 MG 24 hr tablet Take 2 tablets (1,000 mg total) by mouth 2 (two) times daily with a meal. 120 tablet 11 09/04/2023   nitrofurantoin , macrocrystal-monohydrate, (MACROBID ) 100 MG capsule Take 1 capsule (100 mg total) by mouth daily. 90 capsule 2 09/04/2023   omeprazole  (PRILOSEC) 20 MG capsule Take 1 capsule (20 mg total) by mouth daily. 1 tablet a day 30 capsule 6 09/04/2023   Prenatal Vit-Fe Fumarate-FA (PRENATAL VITAMIN PLUS LOW IRON ) 27-1 MG TABS Take 1 tablet by mouth daily at 6 (six) AM. 90 tablet 4 09/04/2023   sertraline  (ZOLOFT )  50 MG tablet Take 1 tablet (50 mg total) by mouth daily. 30 tablet 11 09/04/2023   Accu-Chek Softclix Lancets lancets Use as instructed to check blood sugar 4 times daily (Patient not taking: No sig reported) 100 each 12    aspirin  EC 81 MG tablet Take 2 tablets (162 mg total) by mouth daily. Swallow whole. (Patient not taking: Reported on 08/22/2023) 180 tablet 2    Blood Glucose Monitoring Suppl (ACCU-CHEK GUIDE ME) w/Device KIT 1 each by Does not apply route 4 (four) times daily. (Patient not taking: Reported on 08/22/2023) 1 kit 0    Blood Pressure Monitor MISC For regular home bp monitoring during pregnancy (Patient not taking: Reported on 08/22/2023) 1 each 0    cefadroxil  (DURICEF) 500 MG capsule Take 1 capsule (500 mg total) by mouth 2 (two) times daily. 14 capsule 0    Continuous Glucose Sensor (DEXCOM G7 SENSOR) MISC 1 each by Does not apply route daily at 6 (six) AM. Change sensor every 10 days, please give 30 day supply. Patient is on insulin . (Patient not taking: Reported on 08/22/2023) 3 each 3    Doxylamine -Pyridoxine  (DICLEGIS ) 10-10 MG TBEC Take 2 tablets by mouth at bedtime. (Patient not taking: Reported on 09/04/2023) 60 tablet 3 Not Taking   glucose blood test strip Use as instructed to check blood sugar four times daily (Patient not taking: Reported on 08/22/2023) 100 each 12    hydrOXYzine  (ATARAX ) 10 MG tablet Take 1 tablet (10 mg total) by mouth 3 (three) times daily as needed. (Patient not taking: No sig reported) 30 tablet 6    ondansetron  (ZOFRAN -ODT) 4 MG disintegrating tablet Take 1 tablet (4 mg total) by mouth every 8 (eight) hours as needed for nausea or vomiting. (Patient not taking: Reported on 09/04/2023) 20 tablet 1 Not Taking   pantoprazole  (PROTONIX ) 20 MG tablet Take 1 tablet (20 mg total) by mouth daily. (Patient not taking: Reported on 09/04/2023) 30 tablet 6 Not Taking   phenazopyridine  (PYRIDIUM ) 200 MG tablet Take 1 tablet (200 mg total) by mouth 3 (three) times daily  for 4 days. (Patient not taking: Reported on 09/04/2023) 12 tablet 0 Not Taking   promethazine  (PHENERGAN ) 25 MG tablet Take 0.5-1 tablets (12.5-25 mg total) by mouth every 6 (six) hours as needed. (Patient not taking: Reported on 09/04/2023) 30 tablet 6 Not Taking    ROS: Negative aside from what is listed in the HPI  Physical Exam   Blood pressure 136/65,  pulse 97, temperature 97.9 F (36.6 C), temperature source Oral, resp. rate 16, height 5' 5 (1.651 m), weight 90.3 kg, SpO2 100%.  Physical Exam Constitutional:      Appearance: She is ill-appearing and diaphoretic.  HENT:     Head: Normocephalic and atraumatic.  Cardiovascular:     Rate and Rhythm: Normal rate and regular rhythm.     Heart sounds: No murmur heard.    No friction rub. No gallop.  Pulmonary:     Effort: No respiratory distress.     Breath sounds: Normal breath sounds.  Abdominal:     General: Bowel sounds are normal. There is no distension.     Tenderness: There is no abdominal tenderness. There is left CVA tenderness.  Skin:    General: Skin is warm.  Neurological:     General: No focal deficit present.  Psychiatric:        Mood and Affect: Mood normal.     MAU Course  Procedures  MDM Moderate  Assessment and Plan   Patient is a 28 y/o G3P0111 at [redacted]w[redacted]d who presents to MAU due to pain with urination, difficulty urinating and flank pain.   -Ill appearing upon admission in mild distress. -Complained of urinary retention, nausea, vomiting and inability to tolerate PO intake  -Diagnosed with e.coli UTI but had not started antibiotics -Given 1L LR, 1L NS, 2g Ceftriaxone  IV (with 25mg  of benadryl  due to reported allergy) and 10mg  Reglan . -Patient observed for 6 hours, was able to urinate with post void bladder scan revealing . Also able to tolerate liquid and solid PO.  -Safe to discharge home -Scheduled appointment on 09/06/2023 at [facility] to have additional dose of Ceftriaxone  IM and schedule  subsequent doses as needed.  -Rx sent for Reglan  to use PRN while being treated for UTI. -Encouraged oral hydration upon discharge. -All questions answered, anticipatory guidance and detailed return precautions provided.  -Plan discussed and formulated with Dr. Jayne Bishop L Wojciech Willetts 09/05/2023, 4:18 AM

## 2023-09-06 ENCOUNTER — Ambulatory Visit: Payer: MEDICAID

## 2023-09-06 ENCOUNTER — Ambulatory Visit (INDEPENDENT_AMBULATORY_CARE_PROVIDER_SITE_OTHER): Payer: MEDICAID | Admitting: Obstetrics & Gynecology

## 2023-09-06 VITALS — BP 133/75 | HR 88 | Wt 202.0 lb

## 2023-09-06 DIAGNOSIS — O24113 Pre-existing diabetes mellitus, type 2, in pregnancy, third trimester: Secondary | ICD-10-CM

## 2023-09-06 DIAGNOSIS — O2303 Infections of kidney in pregnancy, third trimester: Secondary | ICD-10-CM

## 2023-09-06 DIAGNOSIS — O2342 Unspecified infection of urinary tract in pregnancy, second trimester: Secondary | ICD-10-CM

## 2023-09-06 DIAGNOSIS — Z794 Long term (current) use of insulin: Secondary | ICD-10-CM | POA: Diagnosis not present

## 2023-09-06 DIAGNOSIS — O0992 Supervision of high risk pregnancy, unspecified, second trimester: Secondary | ICD-10-CM

## 2023-09-06 DIAGNOSIS — Z3A28 28 weeks gestation of pregnancy: Secondary | ICD-10-CM

## 2023-09-06 DIAGNOSIS — O99323 Drug use complicating pregnancy, third trimester: Secondary | ICD-10-CM

## 2023-09-06 DIAGNOSIS — F418 Other specified anxiety disorders: Secondary | ICD-10-CM

## 2023-09-06 DIAGNOSIS — E119 Type 2 diabetes mellitus without complications: Secondary | ICD-10-CM

## 2023-09-06 DIAGNOSIS — F1190 Opioid use, unspecified, uncomplicated: Secondary | ICD-10-CM

## 2023-09-06 DIAGNOSIS — O99343 Other mental disorders complicating pregnancy, third trimester: Secondary | ICD-10-CM

## 2023-09-06 MED ORDER — BUPRENORPHINE HCL 8 MG SL SUBL: SUBLINGUAL_TABLET | SUBLINGUAL | 0 refills | Status: DC

## 2023-09-06 MED ORDER — CEFTRIAXONE SODIUM 1 G IJ SOLR
1.0000 g | Freq: Once | INTRAMUSCULAR | Status: AC
  Administered 2023-09-06: 1 g via INTRAMUSCULAR

## 2023-09-06 MED ORDER — CLONAZEPAM 1 MG PO TABS: 1.0000 mg | ORAL_TABLET | Freq: Three times a day (TID) | ORAL | 0 refills | Status: DC

## 2023-09-06 NOTE — Addendum Note (Signed)
 Addended by: BERNARDO ALAN CROME on: 09/06/2023 10:56 AM   Modules accepted: Orders

## 2023-09-06 NOTE — Progress Notes (Signed)
 HIGH-RISK PREGNANCY VISIT Patient name: Heather [name] MRN [id number]  Date of birth: [birth year] Chief Complaint:   Routine Prenatal Visit  History of Present Illness:   Heather [name] is a 28 y.o. G3P0111 female at [redacted]w[redacted]d with an Estimated Date of Delivery: 11/24/23 being seen today for ongoing management of a high-risk pregnancy complicated by     ICD-10-CM   1. Supervision of high risk pregnancy in second trimester  O09.92     2. Type 2 diabetes mellitus treated with insulin  (HCC)  E11.9 HgB A1c   Z79.4 RPR    CBC    3. [redacted] weeks gestation of pregnancy  Z3A.28 HgB A1c    RPR    CBC    4. Opioid use disorder  F11.90 clonazePAM  (KLONOPIN ) 1 MG tablet    5. Pyelonephritis complicating pregnancy, third trimester  O23.03    soft call, treated in MAU with IV rocephin  2 grams and will treat today and 48 hours from now as well 1 gram IM    6. Depression with anxiety  F41.8 clonazePAM  (KLONOPIN ) 1 MG tablet     .    Today she reports no complaints. Contractions: Not present. Vag. Bleeding: None.  Movement: Present. denies leaking of fluid.      05/24/2023    2:24 PM 10/18/2016   11:25 AM 10/04/2016    2:43 PM 02/02/2016    9:45 AM  Depression screen PHQ 2/9  Decreased Interest 1 0 0 0  Down, Depressed, Hopeless 0 0 0 0  PHQ - 2 Score 1 0 0 0  Altered sleeping 1     Tired, decreased energy 3     Change in appetite 0     Feeling bad or failure about yourself  1     Trouble concentrating 1     Moving slowly or fidgety/restless 0     Suicidal thoughts 0     PHQ-9 Score 7           05/24/2023    2:25 PM  GAD 7 : Generalized Anxiety Score  Nervous, Anxious, on Edge 0  Control/stop worrying 0  Worry too much - different things 2  Trouble relaxing 1  Restless 0  Easily annoyed or irritable 3  Afraid - awful might happen 0  Total GAD 7 Score 6     Review of Systems:   Pertinent items are noted in HPI Denies abnormal vaginal discharge w/ itching/odor/irritation,  headaches, visual changes, shortness of breath, chest pain, abdominal pain, severe nausea/vomiting, or problems with urination or bowel movements unless otherwise stated above. Pertinent History Reviewed:  Reviewed past medical,surgical, social, obstetrical and family history.  Reviewed problem list, medications and allergies. Physical Assessment:   Vitals:   09/06/23 1006  BP: 133/75  Pulse: 88  Weight: 202 lb (91.6 kg)  Body mass index is 33.61 kg/m.           Physical Examination:   General appearance: alert, well appearing, and in no distress  Mental status: alert, oriented to person, place, and time  Skin: warm & dry   Extremities:      Cardiovascular: normal heart rate noted  Respiratory: normal respiratory effort, no distress  Abdomen: gravid, soft, non-tender  Pelvic: Cervical exam deferred         Fetal Status:     Movement: Present    Fetal Surveillance Testing today: FHR 146   Chaperone:     No results found for this or any  previous visit (from the past 24 hours).  Assessment & Plan:  High-risk pregnancy: H6E9888 at [redacted]w[redacted]d with an Estimated Date of Delivery: 11/24/23      ICD-10-CM   1. Supervision of high risk pregnancy in second trimester  O09.92     2. Type 2 diabetes mellitus treated with insulin  (HCC)  E11.9 HgB A1c   Z79.4 RPR    CBC    3. [redacted] weeks gestation of pregnancy  Z3A.28 HgB A1c    RPR    CBC    4. Opioid use disorder  F11.90 clonazePAM  (KLONOPIN ) 1 MG tablet    5. Pyelonephritis complicating pregnancy, third trimester  O23.03    soft call, treated in MAU with IV rocephin  2 grams and will treat today and 48 hours from now as well 1 gram IM    6. Depression with anxiety  F41.8 clonazePAM  (KLONOPIN ) 1 MG tablet         Meds:  Meds ordered this encounter  Medications   clonazePAM  (KLONOPIN ) 1 MG tablet    Sig: Take 1 tablet (1 mg total) by mouth 3 (three) times daily for 14 days.    Dispense:  42 tablet    Refill:  0   buprenorphine   (SUBUTEX ) 8 MG SUBL SL tablet    Sig: Take 1 under the tongue 4 times daily    Dispense:  56 tablet    Refill:  0    Orders:  Orders Placed This Encounter  Procedures   HgB A1c   RPR   CBC     Labs/procedures today:   Treatment Plan:  Return for nurse visit 09/08/23 for Rocephin  1 gram IM.     Follow-up: Return for nurse visit 09/08/23 for Rocephin  1 gram IM.   Future Appointments  Date Time Provider Department Center  09/06/2023 10:50 AM Jayne Vonn DEL, MD [facility]  09/20/2023  2:15 PM [facility] US  [facility] None  09/20/2023  3:10 PM Jayne Vonn DEL, MD [facility]  09/29/2023 11:10 AM [facility] NURSE [facility]  10/03/2023 11:10 AM [facility]UR[facility]  10/03/2023 11:30 AM Jayne Vonn DEL, MD [facility]  10/06/2023 11:10 AM [facility]UR[facility]  10/10/2023 10:45 AM [facility] US  [facility] None  10/10/2023 11:30 AM Marilynn Nest, DO [facility]  10/13/2023 11:10 AM [facility] NURSE [facility]  10/18/2023  2:15 PM [facility] US  [facility] None  10/18/2023  3:10 PM Jayne Vonn DEL, MD [facility]  10/21/2023 11:10 AM [facility] NURSE [facility]  10/24/2023 10:45 AM [facility] US  [facility] None  10/24/2023 11:30 AM Jayne Vonn DEL, MD [facility]  10/27/2023 11:10 AM [facility] NURSE [facility]  10/31/2023 10:45 AM [facility]  [facility] None  10/31/2023 11:30 AM Marilynn Nest, DO [facility]  11/03/2023 11:10 AM [facility] NURSE [facility]  11/07/2023 10:45 AM [facility] US  [facility] None  11/10/2023 11:10 AM [facility]UR[facility]  11/14/2023 10:45 AM [facility]  [facility] None  11/17/2023 11:10 AM [facility]  11/21/2023 10:45 AM [facility]  [facility] None  11/24/2023 11:10 AM [facility]    Orders Placed This Encounter  Procedures   HgB A1c   RPR   CBC   Vonn DEL Jayne  Attending Physician for the Center for [facility] 09/06/2023 10:46 AM

## 2023-09-08 ENCOUNTER — Ambulatory Visit: Payer: MEDICAID

## 2023-09-20 ENCOUNTER — Ambulatory Visit (INDEPENDENT_AMBULATORY_CARE_PROVIDER_SITE_OTHER): Payer: MEDICAID | Admitting: Obstetrics & Gynecology

## 2023-09-20 ENCOUNTER — Ambulatory Visit: Payer: MEDICAID

## 2023-09-20 ENCOUNTER — Encounter: Payer: Self-pay | Admitting: Obstetrics & Gynecology

## 2023-09-20 VITALS — BP 129/75 | HR 90 | Wt 205.0 lb

## 2023-09-20 DIAGNOSIS — O0992 Supervision of high risk pregnancy, unspecified, second trimester: Secondary | ICD-10-CM

## 2023-09-20 DIAGNOSIS — O24113 Pre-existing diabetes mellitus, type 2, in pregnancy, third trimester: Secondary | ICD-10-CM

## 2023-09-20 DIAGNOSIS — Z87440 Personal history of urinary (tract) infections: Secondary | ICD-10-CM | POA: Diagnosis not present

## 2023-09-20 DIAGNOSIS — O99323 Drug use complicating pregnancy, third trimester: Secondary | ICD-10-CM | POA: Diagnosis not present

## 2023-09-20 DIAGNOSIS — F1190 Opioid use, unspecified, uncomplicated: Secondary | ICD-10-CM | POA: Diagnosis not present

## 2023-09-20 DIAGNOSIS — E119 Type 2 diabetes mellitus without complications: Secondary | ICD-10-CM | POA: Diagnosis not present

## 2023-09-20 DIAGNOSIS — R8290 Unspecified abnormal findings in urine: Secondary | ICD-10-CM

## 2023-09-20 DIAGNOSIS — Z3A30 30 weeks gestation of pregnancy: Secondary | ICD-10-CM

## 2023-09-20 DIAGNOSIS — Z794 Long term (current) use of insulin: Secondary | ICD-10-CM

## 2023-09-20 DIAGNOSIS — E1121 Type 2 diabetes mellitus with diabetic nephropathy: Secondary | ICD-10-CM

## 2023-09-20 DIAGNOSIS — F418 Other specified anxiety disorders: Secondary | ICD-10-CM

## 2023-09-20 DIAGNOSIS — F1990 Other psychoactive substance use, unspecified, uncomplicated: Secondary | ICD-10-CM

## 2023-09-20 LAB — POCT URINALYSIS DIPSTICK OB
Glucose, UA: NEGATIVE
Ketones, UA: NEGATIVE
Nitrite, UA: NEGATIVE

## 2023-09-20 MED ORDER — BUPRENORPHINE HCL 8 MG SL SUBL: SUBLINGUAL_TABLET | SUBLINGUAL | 0 refills | Status: DC

## 2023-09-20 MED ORDER — CLONAZEPAM 1 MG PO TABS: 1.0000 mg | ORAL_TABLET | Freq: Three times a day (TID) | ORAL | 0 refills | Status: DC

## 2023-09-20 NOTE — Progress Notes (Signed)
 US  30+5 wks,cephalic,anterior placenta gr 2,AFI 15 cm,FHR 134 bpm,EFW 1616 g 36%

## 2023-09-20 NOTE — Progress Notes (Signed)
 HIGH-RISK PREGNANCY VISIT Patient name: Heather [name] MRN [id number]  Date of birth: 08-31-1995 Chief Complaint:   Routine Prenatal Visit  History of Present Illness:   Heather [name] is a 28 y.o. G3P0111 female at [redacted]w[redacted]d with an Estimated Date of Delivery: 11/24/23 being seen today for ongoing management of a high-risk pregnancy complicated by     ICD-10-CM   1. Type 2 diabetes mellitus treated with insulin  (HCC)  E11.9 POC Urinalysis Dipstick OB   Z79.4     2. [redacted] weeks gestation of pregnancy  Z3A.30 POC Urinalysis Dipstick OB    Urine Culture    3. History of UTI  Z87.440 Urine Culture    4. Abnormal urine odor  R82.90 Urine Culture    5. Opioid use disorder  F11.90 clonazePAM  (KLONOPIN ) 1 MG tablet    6. Depression with anxiety  F41.8 clonazePAM  (KLONOPIN ) 1 MG tablet     .    Today she reports no complaints. Contractions: Not present. Vag. Bleeding: None.  Movement: Present. denies leaking of fluid.      05/24/2023    2:24 PM 10/18/2016   11:25 AM 10/04/2016    2:43 PM 02/02/2016    9:45 AM  Depression screen PHQ 2/9  Decreased Interest 1 0 0 0  Down, Depressed, Hopeless 0 0 0 0  PHQ - 2 Score 1 0 0 0  Altered sleeping 1     Tired, decreased energy 3     Change in appetite 0     Feeling bad or failure about yourself  1     Trouble concentrating 1     Moving slowly or fidgety/restless 0     Suicidal thoughts 0     PHQ-9 Score 7           05/24/2023    2:25 PM  GAD 7 : Generalized Anxiety Score  Nervous, Anxious, on Edge 0  Control/stop worrying 0  Worry too much - different things 2  Trouble relaxing 1  Restless 0  Easily annoyed or irritable 3  Afraid - awful might happen 0  Total GAD 7 Score 6     Review of Systems:   Pertinent items are noted in HPI Denies abnormal vaginal discharge w/ itching/odor/irritation, headaches, visual changes, shortness of breath, chest pain, abdominal pain, severe nausea/vomiting, or problems with urination or bowel  movements unless otherwise stated above. Pertinent History Reviewed:  Reviewed past medical,surgical, social, obstetrical and family history.  Reviewed problem list, medications and allergies. Physical Assessment:   Vitals:   09/20/23 1520  BP: 129/75  Pulse: 90  Weight: 205 lb (93 kg)  Body mass index is 34.11 kg/m.           Physical Examination:   General appearance: alert, well appearing, and in no distress  Mental status: alert, oriented to person, place, and time  Skin: warm & dry   Extremities:      Cardiovascular: normal heart rate noted  Respiratory: normal respiratory effort, no distress  Abdomen: gravid, soft, non-tender  Pelvic: Cervical exam deferred         Fetal Status:     Movement: Present    Fetal Surveillance Testing today: sonogram EFW 36%   Chaperone:     Results for orders placed or performed in visit on 09/20/23 (from the past 24 hours)  POC Urinalysis Dipstick OB   Collection Time: 09/20/23  3:18 PM  Result Value Ref Range   Color, UA  Clarity, UA     Glucose, UA Negative Negative   Bilirubin, UA     Ketones, UA neg    Spec Grav, UA     Blood, UA trace    pH, UA     POC,PROTEIN,UA Small (1+) Negative, Trace, Small (1+), Moderate (2+), Large (3+), 4+   Urobilinogen, UA     Nitrite, UA neg    Leukocytes, UA Moderate (2+) (A) Negative   Appearance     Odor      Assessment & Plan:  High-risk pregnancy: H6E9888 at [redacted]w[redacted]d with an Estimated Date of Delivery: 11/24/23      ICD-10-CM   1. Type 2 diabetes mellitus treated with insulin  (HCC)  E11.9 POC Urinalysis Dipstick OB   Z79.4     2. [redacted] weeks gestation of pregnancy  Z3A.30 POC Urinalysis Dipstick OB    Urine Culture    3. History of UTI  Z87.440 Urine Culture    4. Abnormal urine odor  R82.90 Urine Culture    5. Opioid use disorder  F11.90 clonazePAM  (KLONOPIN ) 1 MG tablet    6. Depression with anxiety  F41.8 clonazePAM  (KLONOPIN ) 1 MG tablet         Meds:  Meds ordered  this encounter  Medications   buprenorphine  (SUBUTEX ) 8 MG SUBL SL tablet    Sig: Take 1 under the tongue 4 times daily    Dispense:  56 tablet    Refill:  0   clonazePAM  (KLONOPIN ) 1 MG tablet    Sig: Take 1 tablet (1 mg total) by mouth 3 (three) times daily for 14 days.    Dispense:  42 tablet    Refill:  0    Orders:  Orders Placed This Encounter  Procedures   Urine Culture   POC Urinalysis Dipstick OB     Labs/procedures today: U/S  Treatment Plan:  keep scheduled surveillance schedule    Follow-up: Return for keep scheduled.   Future Appointments  Date Time Provider Department Center  09/29/2023 11:10 AM [facility] NURSE [facility]  10/03/2023 11:10 AM [facility]  10/03/2023 11:30 AM Jayne Vonn DEL, MD [facility]  10/06/2023 11:10 AM [facility]  10/10/2023 10:45 AM [facility]  [facility] None  10/10/2023 11:30 AM Marilynn Nest, DO [facility]  10/13/2023 11:10 AM [facility]  10/18/2023  2:15 PM [facility]  [facility] None  10/18/2023  3:10 PM Jayne Vonn DEL, MD [facility]  10/21/2023 11:10 AM [facility]  10/24/2023 10:45 AM [facility]  [facility] None  10/24/2023 11:30 AM Jayne Vonn DEL, MD [facility]  10/27/2023 11:10 AM [facility]  10/31/2023 10:45 AM [facility]  [facility] None  10/31/2023 11:30 AM Marilynn Nest, DO [facility]  11/03/2023 11:10 AM [facility] N[facility]  11/07/2023 10:45 AM [facility]  [facility] None  11/07/2023 11:30 AM Marilynn Nest, DO [facility]  11/10/2023 11:10 AM [facility]  11/14/2023 10:45 AM [facility]  [facility] None  11/14/2023 11:50 AM Jayne Vonn DEL, MD [facility]  11/17/2023 11:10 AM [facility]  11/21/2023 10:45 AM [facility]  [facility] None  11/21/2023 11:50 AM Jayne Vonn DEL, MD [facility]  11/24/2023  11:10 AM [facility]    Orders Placed This Encounter  Procedures   Urine Culture   POC Urinalysis Dipstick OB   Vonn DEL Jayne  Attending Physician for the [facility] 09/20/2023 4:21 PM

## 2023-09-21 LAB — CBC
Hematocrit: 35.5 % (ref 34.0–46.6)
Hemoglobin: 11.7 g/dL (ref 11.1–15.9)
MCH: 29 pg (ref 26.6–33.0)
MCHC: 33 g/dL (ref 31.5–35.7)
MCV: 88 fL (ref 79–97)
Platelets: 174 x10E3/uL (ref 150–450)
RBC: 4.03 x10E6/uL (ref 3.77–5.28)
RDW: 13.6 % (ref 11.7–15.4)
WBC: 8.3 x10E3/uL (ref 3.4–10.8)

## 2023-09-21 LAB — HEMOGLOBIN A1C
Est. average glucose Bld gHb Est-mCnc: 126 mg/dL
Hgb A1c MFr Bld: 6 % — ABNORMAL HIGH (ref 4.8–5.6)

## 2023-09-21 LAB — RPR: RPR Ser Ql: NONREACTIVE

## 2023-09-23 ENCOUNTER — Ambulatory Visit (HOSPITAL_COMMUNITY): Payer: Self-pay | Admitting: Obstetrics & Gynecology

## 2023-09-23 LAB — URINE CULTURE

## 2023-09-23 MED ORDER — SULFAMETHOXAZOLE-TRIMETHOPRIM 800-160 MG PO TABS: 1.0000 | ORAL_TABLET | Freq: Two times a day (BID) | ORAL | 0 refills | Status: DC

## 2023-09-29 ENCOUNTER — Other Ambulatory Visit: Payer: MEDICAID

## 2023-09-30 ENCOUNTER — Other Ambulatory Visit: Payer: Self-pay | Admitting: Obstetrics & Gynecology

## 2023-09-30 DIAGNOSIS — F418 Other specified anxiety disorders: Secondary | ICD-10-CM

## 2023-09-30 DIAGNOSIS — F1190 Opioid use, unspecified, uncomplicated: Secondary | ICD-10-CM

## 2023-10-03 ENCOUNTER — Encounter: Payer: Self-pay | Admitting: *Deleted

## 2023-10-03 ENCOUNTER — Other Ambulatory Visit: Payer: MEDICAID

## 2023-10-03 ENCOUNTER — Ambulatory Visit (INDEPENDENT_AMBULATORY_CARE_PROVIDER_SITE_OTHER): Payer: MEDICAID | Admitting: Obstetrics & Gynecology

## 2023-10-03 VITALS — BP 105/66 | HR 79

## 2023-10-03 DIAGNOSIS — O0992 Supervision of high risk pregnancy, unspecified, second trimester: Secondary | ICD-10-CM

## 2023-10-03 DIAGNOSIS — Z794 Long term (current) use of insulin: Secondary | ICD-10-CM

## 2023-10-03 DIAGNOSIS — O0993 Supervision of high risk pregnancy, unspecified, third trimester: Secondary | ICD-10-CM | POA: Diagnosis not present

## 2023-10-03 DIAGNOSIS — F1190 Opioid use, unspecified, uncomplicated: Secondary | ICD-10-CM | POA: Diagnosis not present

## 2023-10-03 DIAGNOSIS — F418 Other specified anxiety disorders: Secondary | ICD-10-CM

## 2023-10-03 DIAGNOSIS — Z3A32 32 weeks gestation of pregnancy: Secondary | ICD-10-CM

## 2023-10-03 DIAGNOSIS — E119 Type 2 diabetes mellitus without complications: Secondary | ICD-10-CM | POA: Diagnosis not present

## 2023-10-03 DIAGNOSIS — N390 Urinary tract infection, site not specified: Secondary | ICD-10-CM

## 2023-10-03 MED ORDER — BUPRENORPHINE HCL 8 MG SL SUBL: SUBLINGUAL_TABLET | SUBLINGUAL | 0 refills | Status: DC

## 2023-10-03 MED ORDER — CLONAZEPAM 1 MG PO TABS: 1.0000 mg | ORAL_TABLET | Freq: Three times a day (TID) | ORAL | 0 refills | Status: DC

## 2023-10-03 MED ORDER — ONDANSETRON 8 MG PO TBDP: 8.0000 mg | ORAL_TABLET | Freq: Three times a day (TID) | ORAL | 1 refills | Status: DC | PRN

## 2023-10-03 NOTE — Progress Notes (Signed)
 HIGH-RISK PREGNANCY VISIT Patient name: Heather [name] MRN [id number]  Date of birth: 05-Apr-1995 Chief Complaint:   Routine Prenatal Visit  History of Present Illness:   Heather [name] is a 28 y.o. G3P0111 female at [redacted]w[redacted]d with an Estimated Date of Delivery: 11/24/23 being seen today for ongoing management of a high-risk pregnancy complicated by     ICD-10-CM   1. Supervision of high risk pregnancy in second trimester  O09.92     2. Opioid use disorder  F11.90 clonazePAM  (KLONOPIN ) 1 MG tablet    3. Depression with anxiety  F41.8 clonazePAM  (KLONOPIN ) 1 MG tablet    4. Type 2 diabetes mellitus treated with insulin  (HCC)  E11.9    Z79.4     5. Recurrent UTI  N39.0      .    Today she reports no complaints.  .  .   . denies leaking of fluid.      05/24/2023    2:24 PM 10/18/2016   11:25 AM 10/04/2016    2:43 PM 02/02/2016    9:45 AM  Depression screen PHQ 2/9  Decreased Interest 1 0 0 0  Down, Depressed, Hopeless 0 0 0 0  PHQ - 2 Score 1 0 0 0  Altered sleeping 1     Tired, decreased energy 3     Change in appetite 0     Feeling bad or failure about yourself  1     Trouble concentrating 1     Moving slowly or fidgety/restless 0     Suicidal thoughts 0     PHQ-9 Score 7           05/24/2023    2:25 PM  GAD 7 : Generalized Anxiety Score  Nervous, Anxious, on Edge 0  Control/stop worrying 0  Worry too much - different things 2  Trouble relaxing 1  Restless 0  Easily annoyed or irritable 3  Afraid - awful might happen 0  Total GAD 7 Score 6     Review of Systems:   Pertinent items are noted in HPI Denies abnormal vaginal discharge w/ itching/odor/irritation, headaches, visual changes, shortness of breath, chest pain, abdominal pain, severe nausea/vomiting, or problems with urination or bowel movements unless otherwise stated above. Pertinent History Reviewed:  Reviewed past medical,surgical, social, obstetrical and family history.  Reviewed problem list,  medications and allergies. Physical Assessment:   Vitals:   10/03/23 1146  BP: 105/66  Pulse: 79  There is no height or weight on file to calculate BMI.           Physical Examination:   General appearance: alert, well appearing, and in no distress  Mental status: alert, oriented to person, place, and time  Skin: warm & dry   Extremities:      Cardiovascular: normal heart rate noted  Respiratory: normal respiratory effort, no distress  Abdomen: gravid, soft, non-tender  Pelvic: Cervical exam deferred         Fetal Status:          Fetal Surveillance Testing today:   Rand Kirley is at [redacted]w[redacted]d Estimated Date of Delivery: 11/24/23  NST being performed due to Class B DM  Today the NST is Reactive  Fetal Monitoring:  Baseline: 160 bpm, Variability: Good {> 6 bpm), Accelerations: Reactive, and Decelerations: Absent   reactive  The accelerations are >15 bpm and more than 2 in 20 minutes  Final diagnosis:  Reactive NST  Vonn VEAR Inch, MD  Chaperone: N/A    No results found for this or any previous visit (from the past 24 hours).  Assessment & Plan:  High-risk pregnancy: H6E9888 at [redacted]w[redacted]d with an Estimated Date of Delivery: 11/24/23      ICD-10-CM   1. Supervision of high risk pregnancy in second trimester  O09.92     2. Opioid use disorder  F11.90 clonazePAM  (KLONOPIN ) 1 MG tablet    3. Depression with anxiety  F41.8 clonazePAM  (KLONOPIN ) 1 MG tablet    4. Type 2 diabetes mellitus treated with insulin  (HCC)  E11.9    Z79.4     5. Recurrent UTI  N39.0          Meds:  Meds ordered this encounter  Medications   buprenorphine  (SUBUTEX ) 8 MG SUBL SL tablet    Sig: Take 1 under the tongue 4 times daily    Dispense:  56 tablet    Refill:  0   clonazePAM  (KLONOPIN ) 1 MG tablet    Sig: Take 1 tablet (1 mg total) by mouth 3 (three) times daily for 14 days.    Dispense:  42 tablet    Refill:  0   ondansetron  (ZOFRAN -ODT) 8 MG disintegrating tablet    Sig: Take  1 tablet (8 mg total) by mouth every 8 (eight) hours as needed for nausea or vomiting.    Dispense:  90 tablet    Refill:  1    Orders: No orders of the defined types were placed in this encounter.    Labs/procedures today: NST  Treatment Plan:  twice weekly surveillance   Follow-up: Return for keep scheduled.   Future Appointments  Date Time Provider Department Center  10/06/2023 11:10 AM [facility] NURSE [facility]  10/10/2023 10:45 AM [facility] US  [facility] None  10/10/2023 11:30 AM Marilynn Nest, DO [facility]  10/13/2023 11:10 AM [facility] NURSE [facility]  10/18/2023  2:15 PM [facility] US  [facility] None  10/18/2023  3:10 PM Jayne Vonn DEL, MD [facility]  10/21/2023 11:10 AM [facility] NURSE [facility]  10/24/2023 10:45 AM [facility] US  [facility] None  10/24/2023 11:30 AM Jayne Vonn DEL, MD [facility]  10/27/2023 11:10 AM [facility] NURSE [facility]  10/31/2023 10:45 AM [facility] US  [facility] None  10/31/2023 11:30 AM Marilynn Nest, DO [facility]  11/03/2023 11:10 AM [facility] NURSE [facility]  11/07/2023 10:45 AM [facility] US  [facility] None  11/07/2023 11:30 AM Marilynn Nest, DO [facility]  11/10/2023 11:10 AM [facility] NURSE [facility]  11/14/2023 10:45 AM [facility] US  [facility] None  11/14/2023 11:50 AM Jayne Vonn DEL, MD [facility]  11/17/2023 11:10 AM [facility] NURSE [facility]  11/21/2023 10:45 AM [facility] US  [facility] None  11/21/2023 11:50 AM Jayne Vonn DEL, MD [facility]  11/24/2023 11:10 AM [facility] NURSE [facility]    No orders of the defined types were placed in this encounter.  Vonn DEL Jayne  Attending Physician for the Center for [facility] 10/03/2023 4:07 PM

## 2023-10-06 ENCOUNTER — Ambulatory Visit: Payer: MEDICAID | Admitting: *Deleted

## 2023-10-06 ENCOUNTER — Inpatient Hospital Stay (HOSPITAL_COMMUNITY)
Admission: AD | Admit: 2023-10-06 | Discharge: 2023-10-09 | DRG: 832 | Disposition: A | Discharge status: Home / Self Care | Payer: MEDICAID | Attending: Obstetrics and Gynecology | Admitting: Obstetrics and Gynecology

## 2023-10-06 VITALS — BP 133/77 | HR 102

## 2023-10-06 DIAGNOSIS — O98313 Other infections with a predominantly sexual mode of transmission complicating pregnancy, third trimester: Secondary | ICD-10-CM | POA: Diagnosis present

## 2023-10-06 DIAGNOSIS — O24113 Pre-existing diabetes mellitus, type 2, in pregnancy, third trimester: Secondary | ICD-10-CM | POA: Diagnosis not present

## 2023-10-06 DIAGNOSIS — F1120 Opioid dependence, uncomplicated: Secondary | ICD-10-CM

## 2023-10-06 DIAGNOSIS — E611 Iron deficiency: Secondary | ICD-10-CM | POA: Diagnosis present

## 2023-10-06 DIAGNOSIS — Z794 Long term (current) use of insulin: Secondary | ICD-10-CM | POA: Diagnosis not present

## 2023-10-06 DIAGNOSIS — Z79899 Other long term (current) drug therapy: Secondary | ICD-10-CM | POA: Diagnosis not present

## 2023-10-06 DIAGNOSIS — A6000 Herpesviral infection of urogenital system, unspecified: Secondary | ICD-10-CM | POA: Diagnosis present

## 2023-10-06 DIAGNOSIS — J45909 Unspecified asthma, uncomplicated: Secondary | ICD-10-CM | POA: Diagnosis present

## 2023-10-06 DIAGNOSIS — Z8759 Personal history of other complications of pregnancy, childbirth and the puerperium: Secondary | ICD-10-CM

## 2023-10-06 DIAGNOSIS — O2303 Infections of kidney in pregnancy, third trimester: Principal | ICD-10-CM | POA: Diagnosis present

## 2023-10-06 DIAGNOSIS — E538 Deficiency of other specified B group vitamins: Secondary | ICD-10-CM | POA: Diagnosis present

## 2023-10-06 DIAGNOSIS — Z7984 Long term (current) use of oral hypoglycemic drugs: Secondary | ICD-10-CM

## 2023-10-06 DIAGNOSIS — Z3A33 33 weeks gestation of pregnancy: Secondary | ICD-10-CM

## 2023-10-06 DIAGNOSIS — E11319 Type 2 diabetes mellitus with unspecified diabetic retinopathy without macular edema: Secondary | ICD-10-CM | POA: Diagnosis present

## 2023-10-06 DIAGNOSIS — O26893 Other specified pregnancy related conditions, third trimester: Secondary | ICD-10-CM | POA: Diagnosis not present

## 2023-10-06 DIAGNOSIS — E1140 Type 2 diabetes mellitus with diabetic neuropathy, unspecified: Secondary | ICD-10-CM | POA: Diagnosis present

## 2023-10-06 DIAGNOSIS — R10A1 Flank pain, right side: Secondary | ICD-10-CM

## 2023-10-06 DIAGNOSIS — O24112 Pre-existing diabetes mellitus, type 2, in pregnancy, second trimester: Secondary | ICD-10-CM

## 2023-10-06 DIAGNOSIS — O0993 Supervision of high risk pregnancy, unspecified, third trimester: Secondary | ICD-10-CM

## 2023-10-06 DIAGNOSIS — F319 Bipolar disorder, unspecified: Secondary | ICD-10-CM | POA: Diagnosis present

## 2023-10-06 DIAGNOSIS — M62838 Other muscle spasm: Secondary | ICD-10-CM | POA: Diagnosis present

## 2023-10-06 DIAGNOSIS — Z833 Family history of diabetes mellitus: Secondary | ICD-10-CM | POA: Diagnosis not present

## 2023-10-06 DIAGNOSIS — Z8249 Family history of ischemic heart disease and other diseases of the circulatory system: Secondary | ICD-10-CM

## 2023-10-06 DIAGNOSIS — F418 Other specified anxiety disorders: Secondary | ICD-10-CM | POA: Diagnosis present

## 2023-10-06 DIAGNOSIS — D696 Thrombocytopenia, unspecified: Secondary | ICD-10-CM | POA: Diagnosis present

## 2023-10-06 DIAGNOSIS — B009 Herpesviral infection, unspecified: Secondary | ICD-10-CM | POA: Diagnosis present

## 2023-10-06 LAB — POCT URINALYSIS DIPSTICK OB
Glucose, UA: NEGATIVE
Ketones, UA: NEGATIVE
Nitrite, UA: POSITIVE

## 2023-10-06 LAB — CBC
HCT: 28.9 % — ABNORMAL LOW (ref 36.0–46.0)
Hemoglobin: 9.6 g/dL — ABNORMAL LOW (ref 12.0–15.0)
MCH: 28.7 pg (ref 26.0–34.0)
MCHC: 33.2 g/dL (ref 30.0–36.0)
MCV: 86.3 fL (ref 80.0–100.0)
Platelets: 137 K/uL — ABNORMAL LOW (ref 150–400)
RBC: 3.35 MIL/uL — ABNORMAL LOW (ref 3.87–5.11)
RDW: 14.2 % (ref 11.5–15.5)
WBC: 10.9 K/uL — ABNORMAL HIGH (ref 4.0–10.5)
nRBC: 0 % (ref 0.0–0.2)

## 2023-10-06 MED ORDER — SERTRALINE HCL 50 MG PO TABS
50.0000 mg | ORAL_TABLET | Freq: Every day | ORAL | Status: DC
  Administered 2023-10-07 – 2023-10-09 (×3): 50 mg via ORAL
  Filled 2023-10-06 (×3): qty 1

## 2023-10-06 MED ORDER — METFORMIN HCL ER 500 MG PO TB24
1000.0000 mg | ORAL_TABLET | Freq: Two times a day (BID) | ORAL | Status: DC
  Administered 2023-10-07 – 2023-10-09 (×5): 1000 mg via ORAL
  Filled 2023-10-06 (×8): qty 2

## 2023-10-06 MED ORDER — DOCUSATE SODIUM 100 MG PO CAPS
100.0000 mg | ORAL_CAPSULE | Freq: Every day | ORAL | Status: DC
  Administered 2023-10-07 – 2023-10-09 (×3): 100 mg via ORAL
  Filled 2023-10-06 (×3): qty 1

## 2023-10-06 MED ORDER — ONDANSETRON HCL 4 MG/2ML IJ SOLN: 4.0000 mg | Freq: Four times a day (QID) | INTRAMUSCULAR | Status: DC | PRN

## 2023-10-06 MED ORDER — LACTATED RINGERS IV BOLUS
500.0000 mL | Freq: Once | INTRAVENOUS | Status: AC
Start: 2023-10-07 — End: 2023-10-06
  Administered 2023-10-06: 500 mL via INTRAVENOUS

## 2023-10-06 MED ORDER — CALCIUM CARBONATE ANTACID 500 MG PO CHEW
2.0000 | CHEWABLE_TABLET | ORAL | Status: DC | PRN
  Administered 2023-10-08: 400 mg via ORAL
  Filled 2023-10-06 (×2): qty 2

## 2023-10-06 MED ORDER — PRENATAL MULTIVITAMIN CH
1.0000 | ORAL_TABLET | Freq: Every day | ORAL | Status: DC
  Administered 2023-10-07 – 2023-10-08 (×2): 1 via ORAL
  Filled 2023-10-06 (×2): qty 1

## 2023-10-06 MED ORDER — SODIUM CHLORIDE 0.9 % IV SOLN
2.0000 g | INTRAVENOUS | Status: DC
  Administered 2023-10-07 – 2023-10-08 (×3): 2 g via INTRAVENOUS
  Filled 2023-10-06 (×3): qty 20

## 2023-10-06 MED ORDER — GABAPENTIN 400 MG PO CAPS
800.0000 mg | ORAL_CAPSULE | Freq: Three times a day (TID) | ORAL | Status: DC
  Administered 2023-10-06 – 2023-10-09 (×8): 800 mg via ORAL
  Filled 2023-10-06 (×2): qty 8
  Filled 2023-10-06 (×9): qty 2
  Filled 2023-10-06: qty 8
  Filled 2023-10-06 (×2): qty 2

## 2023-10-06 MED ORDER — ONDANSETRON 4 MG PO TBDP: 4.0000 mg | ORAL_TABLET | Freq: Four times a day (QID) | ORAL | Status: DC | PRN

## 2023-10-06 MED ORDER — ACETAMINOPHEN 325 MG PO TABS
650.0000 mg | ORAL_TABLET | ORAL | Status: DC | PRN
  Administered 2023-10-07 – 2023-10-08 (×2): 650 mg via ORAL
  Filled 2023-10-06 (×2): qty 2

## 2023-10-06 MED ORDER — BUPRENORPHINE HCL 8 MG SL SUBL
8.0000 mg | SUBLINGUAL_TABLET | Freq: Four times a day (QID) | SUBLINGUAL | Status: DC
  Administered 2023-10-06 – 2023-10-09 (×10): 8 mg via SUBLINGUAL
  Filled 2023-10-06 (×10): qty 1

## 2023-10-06 MED ORDER — DEXTROSE-SODIUM CHLORIDE 5-0.45 % IV SOLN: INTRAVENOUS | Status: DC

## 2023-10-06 MED ORDER — CLONAZEPAM 0.5 MG PO TABS
1.0000 mg | ORAL_TABLET | Freq: Three times a day (TID) | ORAL | Status: DC
  Administered 2023-10-06 – 2023-10-09 (×8): 1 mg via ORAL
  Filled 2023-10-06 (×9): qty 2

## 2023-10-06 MED ORDER — PRENATAL MULTIVITAMIN CH: 1.0000 | ORAL_TABLET | Freq: Every day | ORAL | Status: DC

## 2023-10-06 MED ORDER — ASPIRIN 81 MG PO TBEC
162.0000 mg | DELAYED_RELEASE_TABLET | Freq: Every day | ORAL | Status: DC
  Administered 2023-10-07 – 2023-10-09 (×3): 162 mg via ORAL
  Filled 2023-10-06 (×3): qty 2

## 2023-10-06 MED ORDER — LACTATED RINGERS IV SOLN: 125.0000 mL/h | INTRAVENOUS | Status: AC

## 2023-10-06 MED ORDER — ONDANSETRON 4 MG PO TBDP
8.0000 mg | ORAL_TABLET | Freq: Three times a day (TID) | ORAL | Status: DC | PRN
  Administered 2023-10-07 – 2023-10-09 (×5): 8 mg via ORAL
  Filled 2023-10-06 (×6): qty 2

## 2023-10-06 NOTE — Progress Notes (Signed)
   NURSE VISIT- NST  SUBJECTIVE:  Viridiana Nicastro is a 28 y.o. [phone] female at [redacted]w[redacted]d, here for a NST for pregnancy complicated by Diabetes: T2DM.  She reports active fetal movement, contractions: none, vaginal bleeding: none, membranes: intact. C/O right flank pain radiating to front lower quadrant. States she had chills last night then started sweating, possibly had a fever but did not check temp. Having flu like symptoms along with urge to go but nothing comes out.    OBJECTIVE:  BP 133/77   Pulse (!) 102   LMP  (LMP Unknown)   Right flank pain  Results for orders placed or performed in visit on 10/06/23 (from the past 24 hours)  POC Urinalysis Dipstick OB   Collection Time: 10/06/23 12:21 PM  Result Value Ref Range   Color, UA     Clarity, UA     Glucose, UA Negative Negative   Bilirubin, UA     Ketones, UA neg    Spec Grav, UA     Blood, UA trace    pH, UA     POC,PROTEIN,UA Small (1+) Negative, Trace, Small (1+), Moderate (2+), Large (3+), 4+   Urobilinogen, UA     Nitrite, UA positive    Leukocytes, UA Moderate (2+) (A) Negative   Appearance     Odor      NST: FHR baseline 150 bpm, Variability: moderate, Accelerations:present, Decelerations:  Absent= Cat 1/reactive Toco: none   ASSESSMENT: H6E9888 at [redacted]w[redacted]d with Diabetes: T2DM NST reactive  PLAN: EFM strip reviewed by Dr. Ozan   Recommendations: To [facility]U for admit and IV antibiotics  Tekeyah Santiago  10/06/2023 12:21 PM

## 2023-10-07 DIAGNOSIS — D696 Thrombocytopenia, unspecified: Secondary | ICD-10-CM | POA: Diagnosis present

## 2023-10-07 LAB — IRON AND TIBC
Iron: 18 ug/dL — ABNORMAL LOW (ref 28–170)
Saturation Ratios: 5 % — ABNORMAL LOW (ref 10.4–31.8)
TIBC: 361 ug/dL (ref 250–450)
UIBC: 343 ug/dL

## 2023-10-07 LAB — CBC
HCT: 29 % — ABNORMAL LOW (ref 36.0–46.0)
Hemoglobin: 9.5 g/dL — ABNORMAL LOW (ref 12.0–15.0)
MCH: 28.1 pg (ref 26.0–34.0)
MCHC: 32.8 g/dL (ref 30.0–36.0)
MCV: 85.8 fL (ref 80.0–100.0)
Platelets: 132 K/uL — ABNORMAL LOW (ref 150–400)
RBC: 3.38 MIL/uL — ABNORMAL LOW (ref 3.87–5.11)
RDW: 14.4 % (ref 11.5–15.5)
WBC: 8.8 K/uL (ref 4.0–10.5)
nRBC: 0 % (ref 0.0–0.2)

## 2023-10-07 LAB — RETICULOCYTES
Immature Retic Fract: 10.5 % (ref 2.3–15.9)
RBC.: 3.55 MIL/uL — ABNORMAL LOW (ref 3.87–5.11)
Retic Count, Absolute: 54.7 K/uL (ref 19.0–186.0)
Retic Ct Pct: 1.5 % (ref 0.4–3.1)

## 2023-10-07 LAB — TYPE AND SCREEN: ABO/RH(D): O POS

## 2023-10-07 LAB — GLUCOSE, CAPILLARY
Glucose-Capillary: 145 mg/dL — ABNORMAL HIGH (ref 70–99)
Glucose-Capillary: 191 mg/dL — ABNORMAL HIGH (ref 70–99)
Glucose-Capillary: 176 mg/dL — ABNORMAL HIGH (ref 70–99)
Glucose-Capillary: 139 mg/dL — ABNORMAL HIGH (ref 70–99)

## 2023-10-07 LAB — FOLATE: Folate: 10.6 ng/mL (ref >5.9)

## 2023-10-07 LAB — FERRITIN: Ferritin: 51 ng/mL (ref 11–307)

## 2023-10-07 LAB — VITAMIN B12: Vitamin B-12: <150 pg/mL — ABNORMAL LOW (ref 180–914)

## 2023-10-07 MED ORDER — INSULIN ASPART 100 UNIT/ML IJ SOLN
0.0000 [IU] | Freq: Three times a day (TID) | INTRAMUSCULAR | Status: DC
  Administered 2023-10-07: 3 [IU] via SUBCUTANEOUS
  Administered 2023-10-07: 4 [IU] via SUBCUTANEOUS
  Administered 2023-10-08 (×3): 3 [IU] via SUBCUTANEOUS
  Administered 2023-10-09: 1 [IU] via SUBCUTANEOUS

## 2023-10-07 MED ORDER — INSULIN ASPART 100 UNIT/ML IJ SOLN
5.0000 [IU] | Freq: Three times a day (TID) | INTRAMUSCULAR | Status: DC
  Administered 2023-10-08 – 2023-10-09 (×4): 5 [IU] via SUBCUTANEOUS

## 2023-10-07 MED ORDER — PANTOPRAZOLE SODIUM 40 MG PO TBEC
40.0000 mg | DELAYED_RELEASE_TABLET | Freq: Every day | ORAL | Status: DC
Start: 2023-10-07 — End: 2023-10-09
  Administered 2023-10-07 – 2023-10-09 (×3): 40 mg via ORAL
  Filled 2023-10-07 (×3): qty 1

## 2023-10-07 MED ORDER — INSULIN NPH (HUMAN) (ISOPHANE) 100 UNIT/ML ~~LOC~~ SUSP
10.0000 [IU] | Freq: Two times a day (BID) | SUBCUTANEOUS | Status: DC
  Administered 2023-10-07 – 2023-10-09 (×4): 10 [IU] via SUBCUTANEOUS
  Filled 2023-10-07: qty 10

## 2023-10-07 MED ORDER — CYCLOBENZAPRINE HCL 10 MG PO TABS
10.0000 mg | ORAL_TABLET | Freq: Three times a day (TID) | ORAL | Status: DC | PRN
  Administered 2023-10-07 – 2023-10-09 (×5): 10 mg via ORAL
  Filled 2023-10-07 (×5): qty 1

## 2023-10-07 MED ORDER — DIPHENHYDRAMINE HCL 25 MG PO CAPS
25.0000 mg | ORAL_CAPSULE | Freq: Four times a day (QID) | ORAL | Status: DC | PRN
  Administered 2023-10-07 – 2023-10-08 (×3): 25 mg via ORAL
  Filled 2023-10-07 (×3): qty 1

## 2023-10-07 NOTE — H&P (Signed)
 FACULTY PRACTICE ANTEPARTUM ADMISSION HISTORY AND PHYSICAL NOTE   History of Present Illness: Heather [name] is a 28 y.o. H6E9888 at [redacted]w[redacted]d admitted for pyelonephritis.  Pt was seen at [facility] and complained of right flank pain.  There is also urinary frequency and dysuria.Pregnancy complicated by type 2 DM and substance use disorder. Patient reports the fetal movement as active. Patient reports uterine contraction  activity as none. Patient reports  vaginal bleeding as none. Patient describes fluid per vagina as None. Fetal presentation is unsure.  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   Supervision of high-risk pregnancy 05/17/2023   Chronic cystitis 10/09/2018   History of gestational hypertension 10/20/2015   History of preterm delivery, currently pregnant 10/20/2015   Depression with anxiety 07/16/2015   Deficiency anemia 07/15/2013   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 CHTN  06/11/2015   Hidradenitis 06/05/2012   History of hiatal hernia    Mononucleosis 01/2012   Obesity (BMI 30-39.9)    Other and unspecified ovarian cyst 07/24/2012   Right  Ovarian cyst 8 cm on US  7/15 in ER   Pneumonia    Schizo affective schizophrenia (HCC)    Seizures (HCC)    febrile seizures as a child   Sleep apnea    no cpap   UTI (urinary tract infection) during pregnancy     Past Surgical History:  Procedure Laterality Date   ADENOIDECTOMY     ANTERIOR CRUCIATE LIGAMENT REPAIR     INCISION AND DRAINAGE ABSCESS     Recurrent abscesses of labia and groin area with I and D in office and ER   TONSILLECTOMY     WISDOM TOOTH EXTRACTION      OB History  Gravida Para Term Preterm AB Living  3 1  1 1 1   SAB IAB Ectopic Multiple Live Births  1    1    # Outcome Date GA Lbr Len/2nd Weight Sex Type Anes PTL Lv  3 Current           2 SAB 2018     SAB     1 Preterm 09/05/15 [redacted]w[redacted]d 06:27 / 03:13 3391 g F Vag-Spont EPI Y LIV    Obstetric Comments  2017 started contracting at 28 wks, PPROM at 36wks, [facility]    Social History   Socioeconomic History   Marital status: Married    Spouse name: Not on file   Number of children: 1  Years of education: Not on file   Highest education level: Not on file  Occupational History   Not on file  Tobacco Use   Smoking status: Never   Smokeless tobacco: Never  Vaping Use   Vaping status: Never Used  Substance and Sexual Activity   Alcohol use: Not Currently   Drug use: Not Currently    Types: Oxycodone , Hydrocodone , Fentanyl     Comment: prescibed meds only   Sexual activity: Yes    Birth control/protection: None  Other Topics Concern   Not on file  Social History Narrative   Not on file   Social Drivers of Health   Financial Resource Strain: Low Risk  (08/10/2023)   Received from [facility]   Overall Financial Resource Strain (CARDIA)    How hard is it for you to pay for the very basics like food, housing, medical care,  and heating?: Not very hard  Recent Concern: Financial Resource Strain - Medium Risk (05/24/2023)   Overall Financial Resource Strain (CARDIA)    Difficulty of Paying Living Expenses: Somewhat hard  Food Insecurity: No Food Insecurity (09/04/2023)   Hunger Vital Sign    Worried About Running Out of Food in the Last Year: Never true    Ran Out of Food in the Last Year: Never true  Transportation Needs: No Transportation Needs (09/04/2023)   PRAPARE - Administrator, Civil Service (Medical): No    Lack of Transportation (Non-Medical): No  Physical Activity: Inactive (05/24/2023)   Exercise Vital Sign    Days of Exercise per Week: 0 days    Minutes of Exercise per Session: 0 min  Stress: Stress Concern Present (05/24/2023)   [employer] of Occupational Health - Occupational Stress Questionnaire    Feeling of Stress : To some extent  Social Connections: Moderately Integrated (05/24/2023)   Social Connection and Isolation Panel    Frequency of Communication with Friends and Family: More than three times a week    Frequency of Social Gatherings with Friends and Family: Twice a week    Attends Religious Services: More than 4 times per year    Active Member of [employer] or Organizations: Yes    Attends Banker Meetings: 1 to 4 times per year    Marital Status: Separated    Family History  Problem Relation Age of Onset   Diabetes Mother    Hypertension Mother    Bipolar disorder Father    Asthma Sister        intrinsic   Bipolar disorder Brother    Anxiety disorder Brother    Stroke Maternal Grandmother    Cancer Other        colon, lung, brain-paternal side    Allergies  Allergen Reactions   Prozac [Fluoxetine Hcl]     Stiffness in neck, stroke like symptoms   Cephalosporins Other (See Comments)    Unknown childhood reaction; states took last month at the hospital with benadryl  and was okay.   Latuda [Lurasidone Hcl]     Tongue swelling, confusion      Medications Prior to Admission  Medication Sig Dispense Refill Last Dose/Taking   aspirin  EC 81 MG tablet Take 2 tablets (162 mg total) by mouth daily. Swallow whole. (Patient not taking: Reported on 09/06/2023) 180 tablet 2    buprenorphine  (SUBUTEX ) 8 MG SUBL SL tablet Take 1 under the tongue 4 times daily 56 tablet 0    clonazePAM  (KLONOPIN ) 1 MG tablet Take 1  tablet (1 mg total) by mouth 3 (three) times daily for 14 days. 42 tablet 0    gabapentin  (NEURONTIN ) 800 MG tablet Take 1 tablet (800 mg total) by mouth 3 (three) times daily. 90 tablet 6    metFORMIN  (GLUCOPHAGE -XR) 500 MG 24 hr tablet Take 2 tablets (1,000 mg total) by mouth 2 (two) times daily with a meal. 120 tablet 11    metoCLOPramide  (REGLAN ) 10 MG tablet Take 1 tablet (10 mg total) by mouth 3 (three) times daily with meals for 10 days. 30 tablet 0    omeprazole  (PRILOSEC) 20 MG capsule Take 1 capsule (20 mg total) by mouth daily. 1 tablet a day 30 capsule 6    ondansetron  (ZOFRAN -ODT) 8 MG disintegrating tablet Take 1 tablet (8 mg total) by mouth every 8 (eight) hours as needed for nausea or vomiting. 90 tablet 1    Prenatal Vit-Fe Fumarate-FA (PRENATAL VITAMIN PLUS LOW IRON ) 27-1 MG TABS Take 1 tablet by mouth daily at 6 (six) AM. 90 tablet 4    sertraline  (ZOLOFT ) 50 MG tablet Take 1 tablet (50 mg total) by mouth daily. 30 tablet 11     Review of Systems - History obtained from the patient  Vitals:  BP (!) 123/49 (BP Location: Left Arm)   Pulse 87   Temp 98.8 F (37.1 C) (Oral)   Resp 16   LMP  (LMP Unknown)   SpO2 96%  Physical Examination: CONSTITUTIONAL: Well-developed, well-nourished female in no acute distress.  HENT:  Normocephalic, atraumatic, External right and left ear normal. Oropharynx is clear and moist EYES: Conjunctivae and EOM are normal.  NECK: Normal range of motion, supple, no masses SKIN: Skin is warm and dry. No rash noted. Not diaphoretic. No erythema. No pallor. NEUROLGIC: Alert and oriented  to person, place, and time. Normal reflexes, muscle tone coordination. No cranial nerve deficit noted. PSYCHIATRIC: Normal mood and affect. Normal behavior. Normal judgment and thought content. CARDIOVASCULAR: Normal heart rate noted, regular rhythm RESPIRATORY: Effort and breath sounds normal, no problems with respiration noted ABDOMEN: Soft, nontender, nondistended, gravid. MUSCULOSKELETAL: Normal range of motion. No edema and no tenderness. 2+ distal pulses. Positive right flank tenderness.  Cervix: Not evaluated. Membranes:intact Fetal Monitoring:Baseline: 140s-150s bpm, Variability: moderate, Accelerations: Non-reactive but appropriate for gestational age, and Decelerations: Absent Tocometer: Flat  Labs:  Results for orders placed or performed during the hospital encounter of 10/06/23 (from the past 24 hours)  CBC   Collection Time: 10/06/23 11:14 PM  Result Value Ref Range   WBC 10.9 (H) 4.0 - 10.5 K/uL   RBC 3.35 (L) 3.87 - 5.11 MIL/uL   Hemoglobin 9.6 (L) 12.0 - 15.0 g/dL   HCT 71.0 (L) 63.9 - 53.9 %   MCV 86.3 80.0 - 100.0 fL   MCH 28.7 26.0 - 34.0 pg   MCHC 33.2 30.0 - 36.0 g/dL   RDW 85.7 88.4 - 84.4 %   Platelets 137 (L) 150 - 400 K/uL   nRBC 0.0 0.0 - 0.2 %  Type and screen [facility]   Collection Time: 10/06/23 11:14 PM  Result Value Ref Range   ABO/RH(D) O POS    Antibody Screen NEG    Sample Expiration      10/09/2023,2359 Performed at [facility], [address] [place], [place] [address]   CBC   Collection Time: 10/07/23  5:26 AM  Result Value Ref Range   WBC 8.8 4.0 - 10.5 K/uL   RBC 3.38 (L)  3.87 - 5.11 MIL/uL   Hemoglobin 9.5 (L) 12.0 - 15.0 g/dL   HCT 70.9 (L) 63.9 - 53.9 %   MCV 85.8 80.0 - 100.0 fL   MCH 28.1 26.0 - 34.0 pg   MCHC 32.8 30.0 - 36.0 g/dL   RDW 85.5 88.4 - 84.4 %   Platelets 132 (L) 150 - 400 K/uL   nRBC 0.0 0.0 - 0.2 %  Glucose, capillary   Collection Time: 10/07/23  5:51 AM  Result Value Ref Range    Glucose-Capillary 145 (H) 70 - 99 mg/dL  Results for orders placed or performed in visit on 10/06/23 (from the past 24 hours)  POC Urinalysis Dipstick OB   Collection Time: 10/06/23 12:21 PM  Result Value Ref Range   Color, UA     Clarity, UA     Glucose, UA Negative Negative   Bilirubin, UA     Ketones, UA neg    Spec Grav, UA     Blood, UA trace    pH, UA     POC,PROTEIN,UA Small (1+) Negative, Trace, Small (1+), Moderate (2+), Large (3+), 4+   Urobilinogen, UA     Nitrite, UA positive    Leukocytes, UA Moderate (2+) (A) Negative   Appearance     Odor      Imaging Studies: US  OB Follow Up Result Date: 09/20/2023 Table formatting from the original result was not included. Images from the original result were not included.  ..an [employer] of Ultrasound Medicine Technical sales engineer) accredited practice Center for [facility] [address] [place] [address] Ordering Provider: Ozan, Jennifer, DO FOLLOW UP SONOGRAM Heather [name] is in the office for a follow up sonogram for EFW. She is a 28 y.o. year old G3P0111 with Estimated Date of Delivery: 11/24/23 by LMP now at  [redacted]w[redacted]d weeks gestation. Thus far the pregnancy has been complicated by Type 2 diabetes,hx of preterm delivery,suboxone  maintenance. GESTATION: SINGLETON PRESENTATION: cephalic FETAL ACTIVITY:          Heart rate         134          The fetus is active. AMNIOTIC FLUID: The amniotic fluid volume is  normal, 15 cm. PLACENTA LOCALIZATION:  anterior GRADE 2 CERVIX: Limited view ADNEXA: The ovaries are normal. GESTATIONAL AGE AND  BIOMETRICS: Gestational criteria: Estimated Date of Delivery: 11/24/23 by LMP now at [redacted]w[redacted]d Previous Scans:6          BIPARIETAL DIAMETER           7.41 cm         29+5 weeks   13% HEAD CIRCUMFERENCE           27.96 cm         30+4 weeks   14% ABDOMINAL CIRCUMFERENCE           27.49 cm         31+4 weeks    72% FEMUR LENGTH           5.60 cm         29+3 weeks   9%                                                        AVERAGE EGA(BY THIS SCAN):  30+3 weeks  ESTIMATED FETAL WEIGHT:       1616  grams, 36 % ANATOMICAL SURVEY                                                                            COMMENTS CEREBRAL VENTRICLES yes normal  CHOROID PLEXUS yes normal  CEREBELLUM yes normal  CISTERNA MAGNA  Yes  normal   CAVUM SEPTI PELLUCIDI YES NORMAL              Nose and lips yes normal  FACIAL PROFILE yes normal  4 CHAMBERED HEART yes normal  OUTFLOW TRACTS YES normaL  3VV YES NORMAL  3VTV YES NORMAL  SITUS YES NORMAL      DIAPHRAGM yes normal  STOMACH yes normal  RENAL REGION yes normal  BLADDER yes normal          3 VESSEL CORD yes normal              GENITALIA yes normal female     SUSPECTED ABNORMALITIES:  no QUALITY OF SCAN: satisfactory TECHNICIAN COMMENTS: US  30+5 wks,cephalic,anterior placenta gr 2,AFI 15 cm,FHR 134 bpm,EFW 1616 g 36% A copy of this report including all images has been saved and backed up to a second source for retrieval if needed. All measures and details of the anatomical scan, placentation, fluid volume and pelvic anatomy are contained in that report. Amber JINNY Pitts 09/20/2023 3:09 PM Clinical Impression and recommendations: I have reviewed the sonogram results above, combined with the patient's current clinical course, below are my impressions and any appropriate recommendations for management based on the sonographic findings. 1.  H6E9888 Estimated Date of Delivery: 11/24/23 by serial sonographic evaluations 2.  Fetal sonographic surveillance findings: a). Normal fluid volume b). Normal growth percentile with appropriate interval growth:  36% 3.  Normal general sonographic findings Recommend continued prenatal evaluations and care based on this sonogram and as clinically indicated from the patient's clinical course. 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   Supervision of high-risk pregnancy 05/17/2023   Chronic cystitis 10/09/2018   History of gestational hypertension 10/20/2015   History of preterm delivery, currently pregnant 10/20/2015   Depression with anxiety 07/16/2015   Deficiency anemia 07/15/2013   Microalbuminuric diabetic nephropathy (HCC) 04/17/2013   Hidradenitis 06/05/2012   Bipolar disorder, unspecified (HCC) 03/21/2012   Type 2 diabetes mellitus affecting pregnancy in second trimester, antepartum 03/21/2012   Asthma, chronic 03/21/2012   Hypercholesteremia 01/04/2012   Palpitations 01/04/2012   Bulimia nervosa, purging type (HCC) 11/05/2010   Moderate mood disorder 11/05/2010   Admit to Antenatal Start IV abx with rocephin  Serial cbc Daily NST Continue outpatient subutex    Jerilynn Buddle, MD Faculty attending Center for [facility]

## 2023-10-07 NOTE — Inpatient Diabetes Management (Addendum)
 Inpatient Diabetes Program Recommendations  AACE/ADA: New Consensus Statement on Inpatient Glycemic Control (2015)  Target Ranges:  Prepandial:   less than 140 mg/dL      Peak postprandial:   less than 180 mg/dL (1-2 hours)      Critically ill patients:  140 - 180 mg/dL   Lab Results  Component Value Date   GLUCAP 145 (H) 10/07/2023   HGBA1C 6.0 (H) 09/20/2023    Review of Glycemic Control  Latest Reference Range & Units 10/07/23 05:51  Glucose-Capillary 70 - 99 mg/dL 854 (H)  (H): Data is abnormally high  Diabetes history: DM2 Outpatient Diabetes medications: Metformin  XR 1000 mg BID Current orders for Inpatient glycemic control: Metformin  1000 mg BID  Inpatient Diabetes Program Recommendations:    Referral received for peripartum DM management.  Fasting CBG was 145 mg/dL this morning.  Please consider:  Novolog  0-14 units TID 2 hrs after meals.    Addendum@12 :09:  CBG was 191 mg/dL at noon.  Please consider:  Novolog  3 units TID with meals if she consumes at least 50%.  Met with patient at bedside.  She has a glucometer at home.  She is interested in using a CGM.  She downloaded the [employer] 3 app on her [employer]  Placed the FSL on the back of her left arm.  Educated her on use, alarms, how to remove and apply new sensor.  Provided her with DM education for pregnancy.  Discussed importance of glucose control during pregnancy, CHO goals per meal, when to check glucose and risks to infant PP with uncontrolled glucose trends.  She has used insulin  in the past with with her previous pregnancy using the insulin  pen.  Discussed hypoglycemia, signs, symptoms and treatment.    Will see pt today.    Thank you, Wyvonna Pinal, MSN, CDCES Diabetes Coordinator Inpatient Diabetes Program [phone] (team pager from 8a-5p)

## 2023-10-07 NOTE — Plan of Care (Signed)
  Problem: Education: Goal: Knowledge of General Education information will improve Description: Including pain rating scale, medication(s)/side effects and non-pharmacologic comfort measures Outcome: Progressing   Problem: Health Behavior/Discharge Planning: Goal: Ability to manage health-related needs will improve Outcome: Progressing   Problem: Clinical Measurements: Goal: Ability to maintain clinical measurements within normal limits will improve Outcome: Progressing Goal: Will remain free from infection Outcome: Progressing Goal: Diagnostic test results will improve Outcome: Progressing Goal: Respiratory complications will improve Outcome: Progressing Goal: Cardiovascular complication will be avoided Outcome: Progressing   Problem: Activity: Goal: Risk for activity intolerance will decrease Outcome: Progressing   Problem: Nutrition: Goal: Adequate nutrition will be maintained Outcome: Progressing   Problem: Coping: Goal: Level of anxiety will decrease Outcome: Progressing   Problem: Elimination: Goal: Will not experience complications related to bowel motility Outcome: Progressing Goal: Will not experience complications related to urinary retention Outcome: Progressing   Problem: Pain Managment: Goal: General experience of comfort will improve and/or be controlled Outcome: Progressing   Problem: Safety: Goal: Ability to remain free from injury will improve Outcome: Progressing   Problem: Skin Integrity: Goal: Risk for impaired skin integrity will decrease Outcome: Progressing   Problem: Education: Goal: Ability to describe self-care measures that may prevent or decrease complications (Diabetes Survival Skills Education) will improve Outcome: Progressing Goal: Individualized Educational Video(s) Outcome: Progressing   Problem: Coping: Goal: Ability to adjust to condition or change in health will improve Outcome: Progressing   Problem: Fluid  Volume: Goal: Ability to maintain a balanced intake and output will improve Outcome: Progressing   Problem: Health Behavior/Discharge Planning: Goal: Ability to identify and utilize available resources and services will improve Outcome: Progressing Goal: Ability to manage health-related needs will improve Outcome: Progressing   Problem: Metabolic: Goal: Ability to maintain appropriate glucose levels will improve Outcome: Progressing   Problem: Nutritional: Goal: Maintenance of adequate nutrition will improve Outcome: Progressing Goal: Progress toward achieving an optimal weight will improve Outcome: Progressing   Problem: Skin Integrity: Goal: Risk for impaired skin integrity will decrease Outcome: Progressing   Problem: Tissue Perfusion: Goal: Adequacy of tissue perfusion will improve Outcome: Progressing   Problem: Education: Goal: Knowledge of disease or condition will improve Outcome: Progressing Goal: Knowledge of the prescribed therapeutic regimen will improve Outcome: Progressing Goal: Individualized Educational Video(s) Outcome: Progressing   Problem: Clinical Measurements: Goal: Complications related to the disease process, condition or treatment will be avoided or minimized Outcome: Progressing

## 2023-10-07 NOTE — Progress Notes (Signed)
 Patient ID: Lionel Cart, female   DOB: Jun 05, 1995, 28 y.o.   MRN: [id number] This a.m. patient had significant nausea and vomiting.  She had low-grade temp to 100.  She is already on IV fluids as well as IV antibiotics.  Urine culture is pending. Patient CBG was elevated at 145. Patient reports right flank pain where there is a knot and it is exquisitely tender. On exam patient has a muscle spasm in the right flank with a knot that is tender.   Plan: Begin sliding scale insulin  in addition to metformin  twice daily Continue IV antibiotics Add Flexeril  and heat to affected area

## 2023-10-08 ENCOUNTER — Other Ambulatory Visit: Payer: Self-pay

## 2023-10-08 DIAGNOSIS — Z3A33 33 weeks gestation of pregnancy: Secondary | ICD-10-CM

## 2023-10-08 DIAGNOSIS — E11319 Type 2 diabetes mellitus with unspecified diabetic retinopathy without macular edema: Secondary | ICD-10-CM | POA: Diagnosis present

## 2023-10-08 DIAGNOSIS — O2303 Infections of kidney in pregnancy, third trimester: Principal | ICD-10-CM

## 2023-10-08 DIAGNOSIS — E538 Deficiency of other specified B group vitamins: Secondary | ICD-10-CM | POA: Diagnosis present

## 2023-10-08 DIAGNOSIS — E611 Iron deficiency: Secondary | ICD-10-CM | POA: Diagnosis present

## 2023-10-08 DIAGNOSIS — M62838 Other muscle spasm: Secondary | ICD-10-CM | POA: Diagnosis present

## 2023-10-08 LAB — GLUCOSE, CAPILLARY
Glucose-Capillary: 136 mg/dL — ABNORMAL HIGH (ref 70–99)
Glucose-Capillary: 160 mg/dL — ABNORMAL HIGH (ref 70–99)

## 2023-10-08 LAB — CBC
HCT: 29.4 % — ABNORMAL LOW (ref 36.0–46.0)
Hemoglobin: 9.6 g/dL — ABNORMAL LOW (ref 12.0–15.0)
MCH: 28.2 pg (ref 26.0–34.0)
MCHC: 32.7 g/dL (ref 30.0–36.0)
MCV: 86.5 fL (ref 80.0–100.0)
Platelets: 146 K/uL — ABNORMAL LOW (ref 150–400)
RBC: 3.4 MIL/uL — ABNORMAL LOW (ref 3.87–5.11)
RDW: 14.2 % (ref 11.5–15.5)
WBC: 5.8 K/uL (ref 4.0–10.5)
nRBC: 0 % (ref 0.0–0.2)

## 2023-10-08 MED ORDER — VALACYCLOVIR HCL 500 MG PO TABS
500.0000 mg | ORAL_TABLET | Freq: Two times a day (BID) | ORAL | Status: DC
  Administered 2023-10-08 – 2023-10-09 (×3): 500 mg via ORAL
  Filled 2023-10-08 (×3): qty 1

## 2023-10-08 MED ORDER — FERROUS SULFATE 325 (65 FE) MG PO TABS
325.0000 mg | ORAL_TABLET | ORAL | Status: DC
  Administered 2023-10-08: 325 mg via ORAL
  Filled 2023-10-08 (×2): qty 1

## 2023-10-08 MED ORDER — CYANOCOBALAMIN 1000 MCG/ML IJ SOLN
1000.0000 ug | Freq: Once | INTRAMUSCULAR | Status: AC
  Administered 2023-10-08: 1000 ug via INTRAMUSCULAR
  Filled 2023-10-08: qty 1

## 2023-10-08 NOTE — Assessment & Plan Note (Signed)
 Class CBGs have not been well-controlled on metformin  1 g twice daily Add insulin  basal 10 units NPH twice daily as well as meal coverage 5 units NovoLog  3 times daily.

## 2023-10-08 NOTE — Assessment & Plan Note (Signed)
 Continue Flexeril  as needed Add heat

## 2023-10-08 NOTE — Assessment & Plan Note (Addendum)
 Likely gestational, platelet count is stable at 146 today No evidence of bleeding

## 2023-10-08 NOTE — Assessment & Plan Note (Signed)
 Begin suppression 500 mg twice daily

## 2023-10-08 NOTE — Assessment & Plan Note (Signed)
 Not currently on any mood stabilization

## 2023-10-08 NOTE — Assessment & Plan Note (Signed)
 History of recurrent UTIs Urine culture has gram-negative rods, last urine culture was positive for E. coli which was pansensitive. Rocephin  2 g daily Await culture and sensitivities and then transition to p.o. and discharge. Will need prophylaxis for the remaining of pregnancy.

## 2023-10-08 NOTE — Assessment & Plan Note (Signed)
 Also noted to be mildly iron  deficient Begin ferrous sulfate  325 every other day.

## 2023-10-08 NOTE — Assessment & Plan Note (Signed)
Inhaler as needed

## 2023-10-08 NOTE — Plan of Care (Signed)
  Problem: Education: Goal: Knowledge of General Education information will improve Description: Including pain rating scale, medication(s)/side effects and non-pharmacologic comfort measures Outcome: Progressing   Problem: Health Behavior/Discharge Planning: Goal: Ability to manage health-related needs will improve Outcome: Progressing   Problem: Clinical Measurements: Goal: Ability to maintain clinical measurements within normal limits will improve Outcome: Progressing Goal: Will remain free from infection Outcome: Progressing Goal: Diagnostic test results will improve Outcome: Progressing Goal: Respiratory complications will improve Outcome: Progressing Goal: Cardiovascular complication will be avoided Outcome: Progressing   Problem: Activity: Goal: Risk for activity intolerance will decrease Outcome: Progressing   Problem: Nutrition: Goal: Adequate nutrition will be maintained Outcome: Progressing   Problem: Coping: Goal: Level of anxiety will decrease Outcome: Progressing   Problem: Elimination: Goal: Will not experience complications related to bowel motility Outcome: Progressing Goal: Will not experience complications related to urinary retention Outcome: Progressing   Problem: Pain Managment: Goal: General experience of comfort will improve and/or be controlled Outcome: Progressing   Problem: Safety: Goal: Ability to remain free from injury will improve Outcome: Progressing   Problem: Skin Integrity: Goal: Risk for impaired skin integrity will decrease Outcome: Progressing   Problem: Education: Goal: Ability to describe self-care measures that may prevent or decrease complications (Diabetes Survival Skills Education) will improve Outcome: Progressing Goal: Individualized Educational Video(s) Outcome: Progressing   Problem: Coping: Goal: Ability to adjust to condition or change in health will improve Outcome: Progressing   Problem: Fluid  Volume: Goal: Ability to maintain a balanced intake and output will improve Outcome: Progressing   Problem: Health Behavior/Discharge Planning: Goal: Ability to identify and utilize available resources and services will improve Outcome: Progressing Goal: Ability to manage health-related needs will improve Outcome: Progressing   Problem: Metabolic: Goal: Ability to maintain appropriate glucose levels will improve Outcome: Progressing   Problem: Nutritional: Goal: Maintenance of adequate nutrition will improve Outcome: Progressing Goal: Progress toward achieving an optimal weight will improve Outcome: Progressing   Problem: Skin Integrity: Goal: Risk for impaired skin integrity will decrease Outcome: Progressing   Problem: Tissue Perfusion: Goal: Adequacy of tissue perfusion will improve Outcome: Progressing   Problem: Education: Goal: Knowledge of disease or condition will improve Outcome: Progressing Goal: Knowledge of the prescribed therapeutic regimen will improve Outcome: Progressing Goal: Individualized Educational Video(s) Outcome: Progressing   Problem: Clinical Measurements: Goal: Complications related to the disease process, condition or treatment will be avoided or minimized Outcome: Progressing   Problem: Urinary Elimination: Goal: Signs and symptoms of infection will decrease Outcome: Progressing

## 2023-10-08 NOTE — Assessment & Plan Note (Signed)
 Per patient report Absolutely needs to see ophthalmology as an outpatient and as quickly as possible.

## 2023-10-08 NOTE — Assessment & Plan Note (Signed)
 Workup related to her anemia reveals low B12 Will give 1000 mg IM injection today

## 2023-10-08 NOTE — Assessment & Plan Note (Signed)
 BPs have been well-controlled On aspirin 

## 2023-10-08 NOTE — Assessment & Plan Note (Signed)
 Currently doing well on Subutex  4 times daily Lengthy discussion had with the patient regarding maintenance therapy, need for this lifelong, what will happen with the baby. Will refer to [facility] Patient needs discussion with NICU team regarding postdelivery care and integrated behavioral health support Patient will need lifelong treatment and has been declined to be seen by her PCP Patient is strongly wanting to transition to Sublocade  if possible postdelivery.

## 2023-10-08 NOTE — Progress Notes (Signed)
 FACULTY PRACTICE ANTEPARTUM(COMPREHENSIVE) NOTE  Heather [name] is a 28 y.o. [id number] at [redacted]w[redacted]d by best clinical estimate who is admitted for pyelonephritis.   Fetal presentation is cephalic. Length of Stay:  2  Days  ASSESSMENT/PLAN: Bipolar disorder, unspecified (HCC) Not currently on any mood stabilization  Type 2 diabetes mellitus affecting pregnancy in second trimester, antepartum Class CBGs have not been well-controlled on metformin  1 g twice daily Add insulin  basal 10 units NPH twice daily as well as meal coverage 5 units NovoLog  3 times daily.  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 coli which was pansensitive. Rocephin  2 g daily Await culture and sensitivities and then transition to p.o. and discharge. Will need prophylaxis for the remaining of pregnancy.  Thrombocytopenia Likely gestational, platelet count is stable at 146 today No evidence of bleeding  Vitamin B 12 deficiency Workup related to her anemia reveals low B12 Will give 1000 mg IM injection today  Iron  deficiency Also noted to be mildly iron  deficient Begin ferrous sulfate  325 every other day.  Diabetic retinopathy associated with type 2 diabetes mellitus (HCC) Per patient report Absolutely needs to see ophthalmology as an outpatient and as quickly as possible.  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 Will refer to [facility] clinic Patient needs  discussion with NICU team regarding postdelivery care and integrated behavioral health support Patient will need lifelong treatment and has been declined to be seen by her PCP Patient is strongly wanting to transition to Sublocade  if possible postdelivery.  Muscle spasm Continue Flexeril  as needed Add heat    Subjective: Feels well today.  Continues to have some pain in her right flank. Patient reports the fetal movement as active. Patient reports uterine contraction  activity as none. Patient reports  vaginal bleeding as scant staining. Patient describes fluid per vagina as None. Patient reports seeing her eye doctor recently and being told there was so much blood in the back of her eyes that they could not take care of it and that she needed to have surgery immediately.  Patient declined to come for this because it was in [place] and she did not have transportation.  Vitals:  Blood pressure (!) 108/43, pulse 99, temperature 98.3 F (36.8 C), temperature source Oral, resp. rate 16, SpO2 100%. Physical Examination:  General appearance - alert, well appearing, and in no distress Chest - normal effort Abdomen - gravid, non-tender Fundal Height:  size equals dates Extremities: extremities normal, atraumatic, no cyanosis or edema  Membranes:intact  Fetal Monitoring:  Baseline: 130 bpm, Variability: Good {> 6 bpm), Accelerations: Reactive, and Decelerations: Absent  Labs:  Results for orders placed or performed during the hospital encounter of 10/06/23 (from the past 24 hours)  Vitamin B12   Collection Time: 10/07/23 11:25 AM  Result Value Ref Range   Vitamin B-12 <150 (L) 180 - 914 pg/mL  Folate   Collection Time: 10/07/23 11:25 AM  Result Value Ref Range   Folate 10.6 >5.9 ng/mL  Iron  and TIBC   Collection Time: 10/07/23  11:25 AM  Result Value Ref Range   Iron  18 (L) 28 - 170 ug/dL   TIBC 638 749 - 549 ug/dL   Saturation Ratios 5 (L) 10.4 - 31.8 %   UIBC 343 ug/dL   Ferritin   Collection Time: 10/07/23 11:25 AM  Result Value Ref Range   Ferritin 51 11 - 307 ng/mL  Reticulocytes   Collection Time: 10/07/23 11:25 AM  Result Value Ref Range   Retic Ct Pct 1.5 0.4 - 3.1 %   RBC. 3.55 (L) 3.87 - 5.11 MIL/uL   Retic Count, Absolute 54.7 19.0 - 186.0 K/uL   Immature Retic Fract 10.5 2.3 - 15.9 %  Glucose, capillary   Collection Time: 10/07/23 11:47 AM  Result Value Ref Range   Glucose-Capillary 191 (H) 70 - 99 mg/dL  Glucose, capillary   Collection Time: 10/07/23  1:47 PM  Result Value Ref Range   Glucose-Capillary 176 (H) 70 - 99 mg/dL  Glucose, capillary   Collection Time: 10/07/23  8:03 PM  Result Value Ref Range   Glucose-Capillary 139 (H) 70 - 99 mg/dL  CBC   Collection Time: 10/08/23  4:17 AM  Result Value Ref Range   WBC 5.8 4.0 - 10.5 K/uL   RBC 3.40 (L) 3.87 - 5.11 MIL/uL   Hemoglobin 9.6 (L) 12.0 - 15.0 g/dL   HCT 70.5 (L) 63.9 - 53.9 %   MCV 86.5 80.0 - 100.0 fL   MCH 28.2 26.0 - 34.0 pg   MCHC 32.7 30.0 - 36.0 g/dL   RDW 85.7 88.4 - 84.4 %   Platelets 146 (L) 150 - 400 K/uL   nRBC 0.0 0.0 - 0.2 %  Glucose, capillary   Collection Time: 10/08/23  9:45 AM  Result Value Ref Range   Glucose-Capillary 136 (H) 70 - 99 mg/dL    Imaging Studies:    US  OB Follow Up Result Date: 09/20/2023 Table formatting from the original result was not included. Images from the original result were not included.  ..an [employer] of Ultrasound Medicine Technical sales engineer) accredited practice Center for [facility] [address] [place] [address] Ordering Provider: Ozan, Jennifer, DO FOLLOW UP SONOGRAM Heather [name] is in the office for a follow up sonogram for [facility]. She is a 28 y.o. year old [id number] with Estimated Date of Delivery: 11/24/23 by LMP now at  108w5d weeks gestation. Thus far the pregnancy has been complicated by Type 2 diabetes,hx of preterm delivery,suboxone  maintenance. GESTATION: SINGLETON PRESENTATION: cephalic  FETAL ACTIVITY:          Heart rate         134          The fetus is active. AMNIOTIC FLUID: The amniotic fluid volume is  normal, 15 cm. PLACENTA LOCALIZATION:  anterior GRADE 2 CERVIX: Limited view ADNEXA: The ovaries are normal. GESTATIONAL AGE AND  BIOMETRICS: Gestational criteria: Estimated Date of Delivery: 11/24/23 by LMP now at [redacted]w[redacted]d Previous Scans:6          BIPARIETAL DIAMETER           7.41 cm         29+5 weeks   13% HEAD CIRCUMFERENCE           27.96 cm         30+4 weeks   14% ABDOMINAL CIRCUMFERENCE           27.49 cm         31+4 weeks  72% FEMUR LENGTH           5.60 cm         29+3 weeks   9%                                                       AVERAGE EGA(BY THIS SCAN):  30+3 weeks                                                 ESTIMATED FETAL WEIGHT:       1616  grams, 36 % ANATOMICAL SURVEY                                                                            COMMENTS CEREBRAL VENTRICLES yes normal  CHOROID PLEXUS yes normal  CEREBELLUM yes normal  CISTERNA MAGNA  Yes  normal   CAVUM SEPTI PELLUCIDI YES NORMAL              Nose and lips yes normal  FACIAL PROFILE yes normal  4 CHAMBERED HEART yes normal  OUTFLOW TRACTS YES normaL  3VV YES NORMAL  3VTV YES NORMAL  SITUS YES NORMAL      DIAPHRAGM yes normal  STOMACH yes normal  RENAL REGION yes normal  BLADDER yes normal          3 VESSEL CORD yes normal              GENITALIA yes normal female     SUSPECTED ABNORMALITIES:  no QUALITY OF SCAN: satisfactory TECHNICIAN COMMENTS: US  30+5 wks,cephalic,anterior placenta gr 2,AFI 15 cm,FHR 134 bpm,EFW 1616 g 36% A copy of this report including all images has been saved and backed up to a second source for retrieval if needed. All measures and details of the anatomical scan, placentation, fluid volume and pelvic anatomy are contained in that report. Amber JINNY Pitts 09/20/2023 3:09 PM Clinical Impression and recommendations: I have reviewed the sonogram results above, combined with the patient's  current clinical course, below are my impressions and any appropriate recommendations for management based on the sonographic findings. 1.  [id number] Estimated Date of Delivery: 11/24/23 by serial sonographic evaluations 2.  Fetal sonographic surveillance findings: a). Normal fluid volume b). Normal growth percentile with appropriate interval growth:  36% 3.  Normal general sonographic findings Recommend continued prenatal evaluations and care based on this sonogram and as clinically indicated from the patient's clinical course. Luther H Eure 09/20/2023 4:12 PM      Medications:  Scheduled  aspirin  EC  162 mg Oral Daily   buprenorphine   8 mg Sublingual QID   clonazePAM   1 mg Oral TID   cyanocobalamin  1,000 mcg Intramuscular Once   docusate sodium   100 mg Oral Daily   ferrous sulfate   325 mg Oral QODAY   gabapentin   800 mg Oral Q8H   insulin  aspart  0-16 Units Subcutaneous TID  PC   insulin  aspart  5 Units Subcutaneous TID WC   insulin  NPH Human  10 Units Subcutaneous BID AC & HS   metFORMIN   1,000 mg Oral BID WC   pantoprazole   40 mg Oral Daily   prenatal multivitamin  1 tablet Oral Q1200   sertraline   50 mg Oral Daily   valACYclovir   500 mg Oral BID   I have reviewed the patient's current medications.   Glenys GORMAN Birk, MD 10/08/2023,10:42 AM

## 2023-10-08 NOTE — Assessment & Plan Note (Signed)
 Continue Zoloft  50 mg Klonopin  1 mg 3 times daily

## 2023-10-09 ENCOUNTER — Other Ambulatory Visit (HOSPITAL_COMMUNITY): Payer: Self-pay

## 2023-10-09 LAB — URINE CULTURE: Culture: >=100000 — AB

## 2023-10-09 LAB — CBC
HCT: 29.8 % — ABNORMAL LOW (ref 36.0–46.0)
Hemoglobin: 9.7 g/dL — ABNORMAL LOW (ref 12.0–15.0)
MCH: 28.3 pg (ref 26.0–34.0)
MCHC: 32.6 g/dL (ref 30.0–36.0)
MCV: 86.9 fL (ref 80.0–100.0)
Platelets: 169 K/uL (ref 150–400)
RBC: 3.43 MIL/uL — ABNORMAL LOW (ref 3.87–5.11)
RDW: 14.5 % (ref 11.5–15.5)
WBC: 6.3 K/uL (ref 4.0–10.5)
nRBC: 0 % (ref 0.0–0.2)

## 2023-10-09 LAB — GLUCOSE, CAPILLARY: Glucose-Capillary: 135 mg/dL — ABNORMAL HIGH (ref 70–99)

## 2023-10-09 MED ORDER — INSULIN NPH (HUMAN) (ISOPHANE) 100 UNIT/ML ~~LOC~~ SUSP
15.0000 [IU] | Freq: Two times a day (BID) | SUBCUTANEOUS | 11 refills | Status: DC
  Filled 2023-10-09: qty 10, 33d supply, fill #0

## 2023-10-09 MED ORDER — INSULIN ASPART 100 UNIT/ML IJ SOLN
8.0000 [IU] | Freq: Three times a day (TID) | INTRAMUSCULAR | 11 refills | Status: DC
  Filled 2023-10-09: qty 10, 42d supply, fill #0

## 2023-10-09 MED ORDER — CEFDINIR 300 MG PO CAPS
300.0000 mg | ORAL_CAPSULE | Freq: Two times a day (BID) | ORAL | 0 refills | Status: DC
  Filled 2023-10-09: qty 24, 12d supply, fill #0

## 2023-10-09 MED ORDER — CEFADROXIL 500 MG PO CAPS
1000.0000 mg | ORAL_CAPSULE | Freq: Two times a day (BID) | ORAL | 0 refills | Status: AC
  Filled 2023-10-09: qty 48, 12d supply, fill #0

## 2023-10-09 MED ORDER — CEFADROXIL 500 MG PO CAPS
1000.0000 mg | ORAL_CAPSULE | Freq: Two times a day (BID) | ORAL | 0 refills | Status: DC
  Filled 2023-10-09: qty 48, 12d supply, fill #0

## 2023-10-09 MED ORDER — VALACYCLOVIR HCL 500 MG PO TABS
500.0000 mg | ORAL_TABLET | Freq: Two times a day (BID) | ORAL | 3 refills | Status: DC
  Filled 2023-10-09: qty 46, 23d supply, fill #0

## 2023-10-09 MED ORDER — FERROUS SULFATE 325 (65 FE) MG PO TABS
325.0000 mg | ORAL_TABLET | ORAL | 3 refills | Status: DC
  Filled 2023-10-09: qty 45, 90d supply, fill #0

## 2023-10-09 MED ORDER — NITROFURANTOIN MONOHYD MACRO 100 MG PO CAPS
100.0000 mg | ORAL_CAPSULE | Freq: Every day | ORAL | 1 refills | Status: DC
  Filled 2023-10-09: qty 30, 30d supply, fill #0

## 2023-10-09 MED ORDER — INSULIN SYRINGE-NEEDLE U-100 30G X 1/2" 0.5 ML MISC
1.0000 [IU] | 3 refills | Status: DC
  Filled 2023-10-09: qty 100, 30d supply, fill #0

## 2023-10-09 NOTE — Plan of Care (Signed)
  Problem: Education: Goal: Knowledge of General Education information will improve Description: Including pain rating scale, medication(s)/side effects and non-pharmacologic comfort measures Outcome: Completed/Met   Problem: Health Behavior/Discharge Planning: Goal: Ability to manage health-related needs will improve Outcome: Completed/Met   Problem: Clinical Measurements: Goal: Ability to maintain clinical measurements within normal limits will improve Outcome: Completed/Met Goal: Will remain free from infection Outcome: Completed/Met Goal: Diagnostic test results will improve Outcome: Completed/Met Goal: Respiratory complications will improve Outcome: Completed/Met Goal: Cardiovascular complication will be avoided Outcome: Completed/Met   Problem: Activity: Goal: Risk for activity intolerance will decrease Outcome: Completed/Met   Problem: Nutrition: Goal: Adequate nutrition will be maintained Outcome: Completed/Met   Problem: Coping: Goal: Level of anxiety will decrease Outcome: Completed/Met   Problem: Elimination: Goal: Will not experience complications related to bowel motility Outcome: Completed/Met Goal: Will not experience complications related to urinary retention Outcome: Completed/Met   Problem: Pain Managment: Goal: General experience of comfort will improve and/or be controlled Outcome: Completed/Met   Problem: Safety: Goal: Ability to remain free from injury will improve Outcome: Completed/Met   Problem: Skin Integrity: Goal: Risk for impaired skin integrity will decrease Outcome: Completed/Met   Problem: Education: Goal: Ability to describe self-care measures that may prevent or decrease complications (Diabetes Survival Skills Education) will improve Outcome: Completed/Met Goal: Individualized Educational Video(s) Outcome: Completed/Met   Problem: Coping: Goal: Ability to adjust to condition or change in health will improve Outcome:  Completed/Met   Problem: Fluid Volume: Goal: Ability to maintain a balanced intake and output will improve Outcome: Completed/Met   Problem: Health Behavior/Discharge Planning: Goal: Ability to identify and utilize available resources and services will improve Outcome: Completed/Met Goal: Ability to manage health-related needs will improve Outcome: Completed/Met   Problem: Metabolic: Goal: Ability to maintain appropriate glucose levels will improve Outcome: Completed/Met   Problem: Nutritional: Goal: Maintenance of adequate nutrition will improve Outcome: Completed/Met Goal: Progress toward achieving an optimal weight will improve Outcome: Completed/Met   Problem: Skin Integrity: Goal: Risk for impaired skin integrity will decrease Outcome: Completed/Met   Problem: Tissue Perfusion: Goal: Adequacy of tissue perfusion will improve Outcome: Completed/Met   Problem: Education: Goal: Knowledge of disease or condition will improve Outcome: Completed/Met Goal: Knowledge of the prescribed therapeutic regimen will improve Outcome: Completed/Met Goal: Individualized Educational Video(s) Outcome: Completed/Met   Problem: Clinical Measurements: Goal: Complications related to the disease process, condition or treatment will be avoided or minimized Outcome: Completed/Met   Problem: Urinary Elimination: Goal: Signs and symptoms of infection will decrease Outcome: Completed/Met

## 2023-10-09 NOTE — Discharge Summary (Addendum)
 Physician Discharge Summary  Patient ID: Heather [name] MRN: [id number] DOB/AGE: Apr 01, 1995 28 y.o.  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, antepartum (HCC)   HSV-2 infection   Thrombocytopenia   Vitamin B 12 deficiency   Iron  deficiency   Diabetic retinopathy associated with type 2 diabetes mellitus (HCC)   Muscle spasm   Consults: None  Significant Diagnostic Studies:  Results for orders placed or performed during the hospital encounter of 10/06/23 (from the past 72 hours)  Urine Culture     Status: Abnormal   Collection Time: 10/06/23 10:07 PM   Specimen: Urine, Clean Catch  Result Value Ref Range   Specimen Description URINE, CLEAN CATCH    Special Requests      NONE Performed at [facility], [address] [place], [place] [address]    Culture >=100,000 COLONIES/mL ESCHERICHIA COLI (A)    Report Status 10/09/2023 FINAL    Organism ID, Bacteria ESCHERICHIA COLI (A)       Susceptibility   Escherichia coli - MIC*    AMPICILLIN <=2 SENSITIVE Sensitive     CEFAZOLIN (URINE) Value in next row Sensitive      <=1 SENSITIVEThis is a modified FDA-approved test that has been validated and its performance characteristics determined by the reporting laboratory.  This laboratory is certified under the Clinical Laboratory Improvement Amendments CLIA as qualified to perform high complexity clinical laboratory testing.    CEFEPIME Value in next row Sensitive      <=1 SENSITIVEThis is a modified FDA-approved test that has been validated and its performance characteristics determined by the reporting  laboratory.  This laboratory is certified under the Clinical Laboratory Improvement Amendments CLIA as qualified to perform high complexity clinical laboratory testing.    ERTAPENEM Value in next row Sensitive      <=1 SENSITIVEThis is a modified FDA-approved test that has been validated and its performance characteristics determined by the reporting laboratory.  This laboratory is certified under the Clinical Laboratory Improvement Amendments CLIA as qualified to perform high complexity clinical laboratory testing.    CEFTRIAXONE  Value in next row Sensitive      <=1 SENSITIVEThis is a modified FDA-approved test that has been validated and its performance characteristics determined by the reporting laboratory.  This laboratory is certified under the Clinical Laboratory Improvement Amendments CLIA as qualified to perform high complexity clinical laboratory testing.    CIPROFLOXACIN Value in next row Sensitive      <=1 SENSITIVEThis is a modified FDA-approved test that has been validated and its performance characteristics determined by the reporting laboratory.  This laboratory is certified under the Clinical Laboratory Improvement Amendments CLIA as qualified to perform high complexity clinical laboratory testing.    GENTAMICIN Value in next row Sensitive      <=1 SENSITIVEThis is a modified FDA-approved test that has been validated and its performance characteristics determined by the reporting laboratory.  This laboratory is certified under the Clinical Laboratory Improvement Amendments CLIA as qualified to perform high complexity clinical laboratory testing.    NITROFURANTOIN  Value in next row Sensitive      <=1 SENSITIVEThis is a modified FDA-approved test that has been validated and  its performance characteristics determined by the reporting laboratory.  This laboratory is certified under the Clinical Laboratory Improvement Amendments CLIA as qualified to perform high complexity clinical laboratory  testing.    TRIMETH /SULFA  Value in next row Sensitive      <=1 SENSITIVEThis is a modified FDA-approved test that has been validated and its performance characteristics determined by the reporting laboratory.  This laboratory is certified under the Clinical Laboratory Improvement Amendments CLIA as qualified to perform high complexity clinical laboratory testing.    AMPICILLIN/SULBACTAM Value in next row Sensitive      <=1 SENSITIVEThis is a modified FDA-approved test that has been validated and its performance characteristics determined by the reporting laboratory.  This laboratory is certified under the Clinical Laboratory Improvement Amendments CLIA as qualified to perform high complexity clinical laboratory testing.    PIP/TAZO Value in next row Sensitive      <=4 SENSITIVEThis is a modified FDA-approved test that has been validated and its performance characteristics determined by the reporting laboratory.  This laboratory is certified under the Clinical Laboratory Improvement Amendments CLIA as qualified to perform high complexity clinical laboratory testing.    MEROPENEM Value in next row Sensitive      <=4 SENSITIVEThis is a modified FDA-approved test that has been validated and its performance characteristics determined by the reporting laboratory.  This laboratory is certified under the Clinical Laboratory Improvement Amendments CLIA as qualified to perform high complexity clinical laboratory testing.    * >=100,000 COLONIES/mL ESCHERICHIA COLI  Type and screen [facility]     Status: None   Collection Time: 10/06/23 11:14 PM  Result Value Ref Range   ABO/RH(D) O POS    Antibody Screen NEG    Sample Expiration      10/09/2023,2359 Performed at [facility], [address], [place], [place] [address]   CBC     Status: Abnormal   Collection Time: 10/06/23 11:14 PM  Result Value Ref Range   WBC 10.9 (H) 4.0 - 10.5 K/uL   RBC 3.35 (L) 3.87 - 5.11 MIL/uL    Hemoglobin 9.6 (L) 12.0 - 15.0 g/dL   HCT 71.0 (L) 63.9 - 53.9 %   MCV 86.3 80.0 - 100.0 fL   MCH 28.7 26.0 - 34.0 pg   MCHC 33.2 30.0 - 36.0 g/dL   RDW 85.7 88.4 - 84.4 %   Platelets 137 (L) 150 - 400 K/uL   nRBC 0.0 0.0 - 0.2 %    Comment: Performed at [facility], [address] [place], [place] [address]  CBC     Status: Abnormal   Collection Time: 10/07/23  5:26 AM  Result Value Ref Range   WBC 8.8 4.0 - 10.5 K/uL   RBC 3.38 (L) 3.87 - 5.11 MIL/uL   Hemoglobin 9.5 (L) 12.0 - 15.0 g/dL   HCT 70.9 (L) 63.9 - 53.9 %   MCV 85.8 80.0 - 100.0 fL   MCH 28.1 26.0 - 34.0 pg   MCHC 32.8 30.0 - 36.0 g/dL   RDW 85.5 88.4 - 84.4 %   Platelets 132 (L) 150 - 400 K/uL   nRBC 0.0 0.0 - 0.2 %    Comment: Performed at [facility], [address] [place], [place] [address]  Glucose, capillary     Status: Abnormal   Collection Time: 10/07/23  5:51 AM  Result Value Ref Range   Glucose-Capillary 145 (H) 70 - 99 mg/dL    Comment:  Glucose reference range applies only to samples taken after fasting for at least 8 hours.  Vitamin B12     Status: Abnormal   Collection Time: 10/07/23 11:25 AM  Result Value Ref Range   Vitamin B-12 <150 (L) 180 - 914 pg/mL    Comment: (NOTE) This assay is not validated for testing neonatal or myeloproliferative syndrome specimens for Vitamin B12 levels. Performed at [facility], [address] [place], [place] [address]   Folate     Status: None   Collection Time: 10/07/23 11:25 AM  Result Value Ref Range   Folate 10.6 >5.9 ng/mL    Comment: Performed at [facility], [address], [place], [place] [address]  Iron  and TIBC     Status: Abnormal   Collection Time: 10/07/23 11:25 AM  Result Value Ref Range   Iron  18 (L) 28 - 170 ug/dL   TIBC 638 749 - 549 ug/dL   Saturation Ratios 5 (L) 10.4 - 31.8 %   UIBC 343 ug/dL    Comment: Performed at [facility], [address], [place], [place] [address]  Ferritin     Status:  None   Collection Time: 10/07/23 11:25 AM  Result Value Ref Range   Ferritin 51 11 - 307 ng/mL    Comment: Performed at [facility], [address] [place], [place] [address]  Reticulocytes     Status: Abnormal   Collection Time: 10/07/23 11:25 AM  Result Value Ref Range   Retic Ct Pct 1.5 0.4 - 3.1 %   RBC. 3.55 (L) 3.87 - 5.11 MIL/uL   Retic Count, Absolute 54.7 19.0 - 186.0 K/uL   Immature Retic Fract 10.5 2.3 - 15.9 %    Comment: Performed at [facility], [address] [place], [place] [address]  Glucose, capillary     Status: Abnormal   Collection Time: 10/07/23 11:47 AM  Result Value Ref Range   Glucose-Capillary 191 (H) 70 - 99 mg/dL    Comment: Glucose reference range applies only to samples taken after fasting for at least 8 hours.  Glucose, capillary     Status: Abnormal   Collection Time: 10/07/23  1:47 PM  Result Value Ref Range   Glucose-Capillary 176 (H) 70 - 99 mg/dL    Comment: Glucose reference range applies only to samples taken after fasting for at least 8 hours.  Glucose, capillary     Status: Abnormal   Collection Time: 10/07/23  8:03 PM  Result Value Ref Range   Glucose-Capillary 139 (H) 70 - 99 mg/dL    Comment: Glucose reference range applies only to samples taken after fasting for at least 8 hours.  CBC     Status: Abnormal   Collection Time: 10/08/23  4:17 AM  Result Value Ref Range   WBC 5.8 4.0 - 10.5 K/uL   RBC 3.40 (L) 3.87 - 5.11 MIL/uL   Hemoglobin 9.6 (L) 12.0 - 15.0 g/dL   HCT 70.5 (L) 63.9 - 53.9 %   MCV 86.5 80.0 - 100.0 fL   MCH 28.2 26.0 - 34.0 pg   MCHC 32.7 30.0 - 36.0 g/dL   RDW 85.7 88.4 - 84.4 %   Platelets 146 (L) 150 - 400 K/uL   nRBC 0.0 0.0 - 0.2 %    Comment: Performed at [facility], [address] [place], [place] [address]  Glucose, capillary     Status: Abnormal   Collection  Time: 10/08/23  9:45 AM  Result Value Ref Range   Glucose-Capillary 136 (H) 70 - 99 mg/dL    Comment: Glucose reference  range applies only to samples taken after fasting for at least 8 hours.  Glucose, capillary     Status: Abnormal   Collection Time: 10/08/23 10:14 PM  Result Value Ref Range   Glucose-Capillary 160 (H) 70 - 99 mg/dL    Comment: Glucose reference range applies only to samples taken after fasting for at least 8 hours.  CBC     Status: Abnormal   Collection Time: 10/09/23  5:03 AM  Result Value Ref Range   WBC 6.3 4.0 - 10.5 K/uL   RBC 3.43 (L) 3.87 - 5.11 MIL/uL   Hemoglobin 9.7 (L) 12.0 - 15.0 g/dL   HCT 70.1 (L) 63.9 - 53.9 %   MCV 86.9 80.0 - 100.0 fL   MCH 28.3 26.0 - 34.0 pg   MCHC 32.6 30.0 - 36.0 g/dL   RDW 85.4 88.4 - 84.4 %   Platelets 169 150 - 400 K/uL   nRBC 0.0 0.0 - 0.2 %    Comment: Performed at [facility], [address], [place], [place] [address]  Glucose, capillary     Status: Abnormal   Collection Time: 10/09/23  7:46 AM  Result Value Ref Range   Glucose-Capillary 135 (H) 70 - 99 mg/dL    Comment: Glucose reference range applies only to samples taken after fasting for at least 8 hours.     US  OB Follow Up Result Date: 09/20/2023 Table formatting from the original result was not included. Images from the original result were not included.  ..an [employer]trasound Medicine Technical sales engineer) accredited practice Center for [facility] [address] [place] [address] Ordering Provider: Ozan, Jennifer, DO FOLLOW UP SONOGRAM Miguel Prats is in the office for a follow up sonogram for EFW. She is a 28 y.o. year old [id number] with Estimated Date of Delivery: 11/24/23 by LMP now at  [redacted]w[redacted]d weeks gestation. Thus far the pregnancy has been complicated by Type 2 diabetes,hx of preterm delivery,suboxone  maintenance. GESTATION: SINGLETON PRESENTATION: cephalic FETAL ACTIVITY:          Heart rate         134          The fetus is active. AMNIOTIC FLUID: The amniotic fluid volume is  normal, 15 cm. PLACENTA LOCALIZATION:  anterior GRADE 2 CERVIX:  Limited view ADNEXA: The ovaries are normal. GESTATIONAL AGE AND  BIOMETRICS: Gestational criteria: Estimated Date of Delivery: 11/24/23 by LMP now at [redacted]w[redacted]d Previous Scans:6          BIPARIETAL DIAMETER           7.41 cm         29+5 weeks   13% HEAD CIRCUMFERENCE           27.96 cm         30+4 weeks   14% ABDOMINAL CIRCUMFERENCE           27.49 cm         31+4 weeks    72% FEMUR LENGTH           5.60 cm         29+3 weeks   9%  AVERAGE EGA(BY THIS SCAN):  30+3 weeks                                                 ESTIMATED FETAL WEIGHT:       1616  grams, 36 % ANATOMICAL SURVEY                                                                            COMMENTS CEREBRAL VENTRICLES yes normal  CHOROID PLEXUS yes normal  CEREBELLUM yes normal  CISTERNA MAGNA  Yes  normal   CAVUM SEPTI PELLUCIDI YES NORMAL              Nose and lips yes normal  FACIAL PROFILE yes normal  4 CHAMBERED HEART yes normal  OUTFLOW TRACTS YES normaL  3VV YES NORMAL  3VTV YES NORMAL  SITUS YES NORMAL      DIAPHRAGM yes normal  STOMACH yes normal  RENAL REGION yes normal  BLADDER yes normal          3 VESSEL CORD yes normal              GENITALIA yes normal female     SUSPECTED ABNORMALITIES:  no QUALITY OF SCAN: satisfactory TECHNICIAN COMMENTS: US  30+5 wks,cephalic,anterior placenta gr 2,AFI 15 cm,FHR 134 bpm,EFW 1616 g 36% A copy of this report including all images has been saved and backed up to a second source for retrieval if needed. All measures and details of the anatomical scan, placentation, fluid volume and pelvic anatomy are contained in that report. Amber JINNY Pitts 09/20/2023 3:09 PM Clinical Impression and recommendations: I have reviewed the sonogram results above, combined with the patient's current clinical course, below are my impressions and any appropriate recommendations for management based on the sonographic findings. 1.  [id number] Estimated Date of Delivery: 11/24/23 by  serial sonographic evaluations 2.  Fetal sonographic surveillance findings: a). Normal fluid volume b). Normal growth percentile with appropriate interval growth:  36% 3.  Normal general sonographic findings Recommend continued prenatal evaluations and care based on this sonogram and as clinically indicated from the patient's clinical course. Vonn VEAR Inch 09/20/2023 4:12 PM      Hospital Course: Patient was admitted with flank pain and generally not feeling well and a history with multiple UTIs this pregnancy.  The patient had low-grade fever at 100.0 and flank pain.  White count was initially 10.9, but was down to 6.3 at discharge.  Urine culture grew E. coli that was pansensitive.  Patient was placed on IV Rocephin .  She had diabetes education consultation and was started on basal and meal coverage insulin  in addition to her metformin .  She continued to have poor glycemic control.  Patient was noted to be anemic as well and had workup that showed she was B12 deficient and got 1 dose of IM B12 prior to discharge.  She was also mildly iron  deficient and iron  was started for her as well.  In conversation with the patient was noted that she had diabetic retinopathy that needs follow-up.  Patient was without  significant fever and was tolerating p.o.  She was continued on her Klonopin  and Subutex  and these medications were stable.  Please see problem-based charting.   Bipolar disorder, unspecified (HCC) Not currently on any mood stabilization  Type 2 diabetes mellitus affecting pregnancy in second trimester, antepartum Class R/F CBGs have not been well-controlled on metformin  1 g twice daily Add insulin  basal 10 units NPH twice daily as well as meal coverage 5 units NovoLog  3 times daily. At discharge this was increased to 15 units NPH twice daily as well as meal coverage 8 units NovoLog  3 times daily.  Asthma, chronic Inhaler as needed  Depression with anxiety Continue Zoloft  50 mg Klonopin  1 mg 3  times daily  History of gestational hypertension BPs have been well-controlled On aspirin   HSV-2 infection Begin suppression 500 mg twice daily  Pyelonephritis complicating pregnancy in third trimester History of recurrent UTIs Urine culture has gram-negative rods, last urine culture was positive for E. coli which was pansensitive. Rocephin  2 g daily transition to Omnicef 300 mg twice daily for completion of 14 days total Will need prophylaxis with Macrobid  nightly for the remaining of pregnancy.  Thrombocytopenia Likely gestational, platelet count is stable at 146 today No evidence of bleeding  Vitamin B 12 deficiency Workup related to her anemia reveals low B12 Status post 1000 mg IM injection   Iron  deficiency Also noted to be mildly iron  deficient Begin ferrous sulfate  325 every other day.  Diabetic retinopathy associated with type 2 diabetes mellitus (HCC) Per patient report Absolutely needs to see ophthalmology as an outpatient and as quickly as possible.  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 Will refer to [facility] clinic Patient needs discussion with NICU team regarding postdelivery care and integrated behavioral health support Patient will need lifelong treatment and has been declined to be seen by her PCP Patient is strongly wanting to transition to Sublocade  if possible postdelivery.  Muscle spasm Continue Flexeril  as needed Add heat  Discharge exam: Vitals:   10/08/23 2005 10/08/23 2356 10/09/23 0628 10/09/23 0746  BP: (!) 122/59 (!) 119/53 117/61 (!) 106/45  Pulse: 88 85 90 91  Resp: 18 16 17 17   Temp: 98 F (36.7 C) 98.3 F (36.8 C) 98 F (36.7 C) 98.4 F (36.9 C)  TempSrc: Oral Oral Oral Oral  SpO2: 96% 96% 96% 94%    Physical Examination: General  appearance - alert, well appearing, and in no distress Chest -normal effort Heart - normal rate, regular rhythm, normal S1, S2, no murmurs, rubs, clicks or gallops Abdomen -gravid, nontender Musculoskeletal - no joint tenderness, deformity or swelling, muscle spasm noted in right flank decreasing in size  NST:  Baseline: 130 bpm, Variability: Good {> 6 bpm), Accelerations: Reactive, and Decelerations: Absent  Disposition: Discharge disposition: 01-Home or Self Care       Discharged Condition: fair  Discharge Instructions     Discharge activity:  Bathroom / Shower only   Complete by: As directed    Discharge activity: Bedrest   Complete by: As directed    Discharge diet:  No restrictions   Complete by: As directed    Fetal Kick Count:  Lie on our left side for one hour after a meal, and count the number of times your baby kicks.  If it is less than 5 times, get up,  move around and drink some juice.  Repeat the test 30 minutes later.  If it is still less than 5 kicks in an hour, notify your doctor.   Complete by: As directed    Notify physician for a general feeling that something is not right   Complete by: As directed    Notify physician for increase or change in vaginal discharge   Complete by: As directed    Notify physician for intestinal cramps, with or without diarrhea, sometimes described as gas pain   Complete by: As directed    Notify physician for leaking of fluid   Complete by: As directed    Notify physician for low, dull backache, unrelieved by heat or Tylenol    Complete by: As directed    Notify physician for menstrual like cramps   Complete by: As directed    Notify physician for pelvic pressure   Complete by: As directed    Notify physician for uterine contractions.  These may be painless and feel like the uterus is tightening or the baby is  balling up   Complete by: As directed    Notify physician for vaginal bleeding   Complete by: As directed     PRETERM LABOR:  Includes any of the follwing symptoms that occur between 20 - [redacted] weeks gestation.  If these symptoms are not stopped, preterm labor can result in preterm delivery, placing your baby at risk   Complete by: As directed       Allergies as of 10/09/2023       Reactions   Prozac [fluoxetine Hcl]    Stiffness in neck, stroke like symptoms   Cephalosporins Other (See Comments)   Unknown childhood reaction; states took last month at the hospital with benadryl  and was okay.   Latuda [lurasidone Hcl]    Tongue swelling, confusion         Medication List     TAKE these medications    aspirin  EC 81 MG tablet Take 2 tablets (162 mg total) by mouth daily. Swallow whole.   buprenorphine  8 MG Subl SL tablet Commonly known as: SUBUTEX  Take 1 under the tongue 4 times daily   cefadroxil  500 MG capsule Commonly known as: DURICEF Take 2 capsules (1,000 mg total) by mouth 2 (two) times daily for 12 days.   cefdinir 300 MG capsule Commonly known as: OMNICEF Take 1 capsule (300 mg total) by mouth 2 (two) times daily for 12 days.   clonazePAM  1 MG tablet Commonly known as: KLONOPIN  Take 1 tablet (1 mg total) by mouth 3 (three) times daily for 14 days.   FeroSul 325 (65 Fe) MG tablet Generic drug: ferrous sulfate  Take 1 tablet (325 mg total) by mouth every other day. Start taking on: October 10, 2023   gabapentin  800 MG tablet Commonly known as: NEURONTIN  Take 1 tablet (800 mg total) by mouth 3 (three) times daily.   HumuLIN N 100 UNIT/ML injection Generic drug: insulin  NPH Human Inject 0.15 mLs (15 Units total) into the skin 2 (two) times daily at 8 am and 10 pm.   metFORMIN  500 MG 24 hr tablet Commonly known as: GLUCOPHAGE -XR Take 2 tablets (1,000 mg total) by mouth 2 (two) times daily with a meal.   metoCLOPramide  10 MG tablet Commonly known as: REGLAN  Take 1 tablet (10 mg total) by mouth 3 (three) times daily with meals for 10 days.   nitrofurantoin   (macrocrystal-monohydrate) 100 MG capsule Commonly known as: Macrobid  Take 1 capsule (100  mg total) by mouth at bedtime. Begin after completion of Omnicef (cefdinir)   NovoLOG  100 UNIT/ML injection Generic drug: insulin  aspart Inject 8 Units into the skin 3 (three) times daily with meals.   omeprazole  20 MG capsule Commonly known as: PRILOSEC Take 1 capsule (20 mg total) by mouth daily. 1 tablet a day   ondansetron  8 MG disintegrating tablet Commonly known as: ZOFRAN -ODT Take 1 tablet (8 mg total) by mouth every 8 (eight) hours as needed for nausea or vomiting.   Prenatal Vitamin Plus Low Iron  27-1 MG Tabs Take 1 tablet by mouth daily at 6 (six) AM.   sertraline  50 MG tablet Commonly known as: Zoloft  Take 1 tablet (50 mg total) by mouth daily.   UltiCare Insulin  Syringe 30G X 1/2 0.5 ML Misc Generic drug: Insulin  Syringe-Needle U-100 Use to inject insulin  as directed (1 Units by Does not apply route as directed.)   valACYclovir  500 MG tablet Commonly known as: VALTREX  Take 1 tablet (500 mg total) by mouth 2 (two) times daily.        Follow-up Information     [facility] Follow up in 1 week(s).   Specialty: Obstetrics and Gynecology Why: OB check up Contact information: [address] [place]  [address] [phone]               Time spent on discharge approximately 36 minutes Signed: Glenys GORMAN Birk 10/09/2023, 11:50 AM

## 2023-10-09 NOTE — Plan of Care (Signed)
  Problem: Education: Goal: Knowledge of General Education information will improve Description: Including pain rating scale, medication(s)/side effects and non-pharmacologic comfort measures Outcome: Progressing   Problem: Health Behavior/Discharge Planning: Goal: Ability to manage health-related needs will improve Outcome: Progressing   Problem: Clinical Measurements: Goal: Ability to maintain clinical measurements within normal limits will improve Outcome: Progressing Goal: Will remain free from infection Outcome: Progressing Goal: Diagnostic test results will improve Outcome: Progressing Goal: Respiratory complications will improve Outcome: Progressing Goal: Cardiovascular complication will be avoided Outcome: Progressing   Problem: Activity: Goal: Risk for activity intolerance will decrease Outcome: Progressing   Problem: Nutrition: Goal: Adequate nutrition will be maintained Outcome: Progressing   Problem: Coping: Goal: Level of anxiety will decrease Outcome: Progressing   Problem: Elimination: Goal: Will not experience complications related to bowel motility Outcome: Progressing Goal: Will not experience complications related to urinary retention Outcome: Progressing   Problem: Pain Managment: Goal: General experience of comfort will improve and/or be controlled Outcome: Progressing   Problem: Safety: Goal: Ability to remain free from injury will improve Outcome: Progressing   Problem: Skin Integrity: Goal: Risk for impaired skin integrity will decrease Outcome: Progressing   Problem: Education: Goal: Ability to describe self-care measures that may prevent or decrease complications (Diabetes Survival Skills Education) will improve Outcome: Progressing Goal: Individualized Educational Video(s) Outcome: Progressing   Problem: Coping: Goal: Ability to adjust to condition or change in health will improve Outcome: Progressing   Problem: Fluid  Volume: Goal: Ability to maintain a balanced intake and output will improve Outcome: Progressing   Problem: Health Behavior/Discharge Planning: Goal: Ability to identify and utilize available resources and services will improve Outcome: Progressing Goal: Ability to manage health-related needs will improve Outcome: Progressing   Problem: Metabolic: Goal: Ability to maintain appropriate glucose levels will improve Outcome: Progressing   Problem: Nutritional: Goal: Maintenance of adequate nutrition will improve Outcome: Progressing Goal: Progress toward achieving an optimal weight will improve Outcome: Progressing   Problem: Skin Integrity: Goal: Risk for impaired skin integrity will decrease Outcome: Progressing   Problem: Tissue Perfusion: Goal: Adequacy of tissue perfusion will improve Outcome: Progressing   Problem: Education: Goal: Knowledge of disease or condition will improve Outcome: Progressing Goal: Knowledge of the prescribed therapeutic regimen will improve Outcome: Progressing Goal: Individualized Educational Video(s) Outcome: Progressing   Problem: Clinical Measurements: Goal: Complications related to the disease process, condition or treatment will be avoided or minimized Outcome: Progressing   Problem: Urinary Elimination: Goal: Signs and symptoms of infection will decrease Outcome: Progressing

## 2023-10-10 ENCOUNTER — Other Ambulatory Visit (HOSPITAL_COMMUNITY): Payer: Self-pay

## 2023-10-10 ENCOUNTER — Encounter: Payer: MEDICAID | Admitting: Obstetrics & Gynecology

## 2023-10-10 ENCOUNTER — Other Ambulatory Visit: Payer: MEDICAID

## 2023-10-11 LAB — URINE DRUGS OF ABUSE SCREEN W ALC, ROUTINE (REF LAB)
Amphetamines, Urine: NEGATIVE ng/mL
Barbiturate, Ur: NEGATIVE ng/mL
Cannabinoid Quant, Ur: NEGATIVE ng/mL
Cocaine (Metab.): NEGATIVE ng/mL
Creatinine, Urine: 79.1 mg/dL (ref 20.0–300.0)
Ethanol U, Quan: NEGATIVE %
Methadone Screen, Urine: NEGATIVE ng/mL
Nitrite Urine, Quantitative: NEGATIVE ug/mL
OPIATE SCREEN URINE: NEGATIVE ng/mL
Phencyclidine, Ur: NEGATIVE ng/mL
Propoxyphene, Urine: NEGATIVE ng/mL
pH, Urine: 7.1 (ref 4.5–8.9)

## 2023-10-11 LAB — DRUG PROFILE 799031: BENZODIAZEPINES: NEGATIVE

## 2023-10-12 ENCOUNTER — Ambulatory Visit: Payer: Self-pay | Admitting: Obstetrics and Gynecology

## 2023-10-13 ENCOUNTER — Encounter: Payer: MEDICAID | Admitting: Obstetrics & Gynecology

## 2023-10-13 ENCOUNTER — Other Ambulatory Visit: Payer: MEDICAID

## 2023-10-17 ENCOUNTER — Encounter: Payer: Self-pay | Admitting: Women's Health

## 2023-10-17 ENCOUNTER — Other Ambulatory Visit: Payer: MEDICAID

## 2023-10-17 ENCOUNTER — Encounter: Payer: Self-pay | Admitting: Obstetrics & Gynecology

## 2023-10-17 ENCOUNTER — Other Ambulatory Visit: Payer: Self-pay | Admitting: Obstetrics & Gynecology

## 2023-10-17 DIAGNOSIS — F418 Other specified anxiety disorders: Secondary | ICD-10-CM

## 2023-10-17 DIAGNOSIS — F1190 Opioid use, unspecified, uncomplicated: Secondary | ICD-10-CM

## 2023-10-17 MED ORDER — CLONAZEPAM 1 MG PO TABS
1.0000 mg | ORAL_TABLET | Freq: Three times a day (TID) | ORAL | 0 refills | Status: DC
Start: 2023-10-17 — End: 2023-10-31

## 2023-10-17 MED ORDER — BUPRENORPHINE HCL 8 MG SL SUBL: SUBLINGUAL_TABLET | SUBLINGUAL | 0 refills | Status: DC

## 2023-10-18 ENCOUNTER — Ambulatory Visit (INDEPENDENT_AMBULATORY_CARE_PROVIDER_SITE_OTHER): Payer: MEDICAID | Admitting: Obstetrics and Gynecology

## 2023-10-18 ENCOUNTER — Ambulatory Visit: Payer: MEDICAID

## 2023-10-18 VITALS — BP 124/86 | HR 71 | Wt 205.6 lb

## 2023-10-18 DIAGNOSIS — Z8759 Personal history of other complications of pregnancy, childbirth and the puerperium: Secondary | ICD-10-CM

## 2023-10-18 DIAGNOSIS — B009 Herpesviral infection, unspecified: Secondary | ICD-10-CM

## 2023-10-18 DIAGNOSIS — O24113 Pre-existing diabetes mellitus, type 2, in pregnancy, third trimester: Secondary | ICD-10-CM | POA: Diagnosis not present

## 2023-10-18 DIAGNOSIS — O99323 Drug use complicating pregnancy, third trimester: Secondary | ICD-10-CM | POA: Diagnosis not present

## 2023-10-18 DIAGNOSIS — O0993 Supervision of high risk pregnancy, unspecified, third trimester: Secondary | ICD-10-CM | POA: Diagnosis not present

## 2023-10-18 DIAGNOSIS — Z3A34 34 weeks gestation of pregnancy: Secondary | ICD-10-CM

## 2023-10-18 DIAGNOSIS — F1190 Opioid use, unspecified, uncomplicated: Secondary | ICD-10-CM

## 2023-10-18 DIAGNOSIS — O24112 Pre-existing diabetes mellitus, type 2, in pregnancy, second trimester: Secondary | ICD-10-CM

## 2023-10-18 DIAGNOSIS — E119 Type 2 diabetes mellitus without complications: Secondary | ICD-10-CM

## 2023-10-18 DIAGNOSIS — E1121 Type 2 diabetes mellitus with diabetic nephropathy: Secondary | ICD-10-CM | POA: Diagnosis not present

## 2023-10-18 DIAGNOSIS — Z794 Long term (current) use of insulin: Secondary | ICD-10-CM

## 2023-10-18 DIAGNOSIS — F1990 Other psychoactive substance use, unspecified, uncomplicated: Secondary | ICD-10-CM

## 2023-10-18 DIAGNOSIS — O24312 Unspecified pre-existing diabetes mellitus in pregnancy, second trimester: Secondary | ICD-10-CM | POA: Diagnosis not present

## 2023-10-18 DIAGNOSIS — N390 Urinary tract infection, site not specified: Secondary | ICD-10-CM

## 2023-10-18 DIAGNOSIS — O0992 Supervision of high risk pregnancy, unspecified, second trimester: Secondary | ICD-10-CM

## 2023-10-18 DIAGNOSIS — F418 Other specified anxiety disorders: Secondary | ICD-10-CM | POA: Diagnosis not present

## 2023-10-18 NOTE — Progress Notes (Signed)
 US  34+5 wks,cephalic,anterior placenta gr 3,BPP 8/8,FHR 152 bpm,AFI 17 cm,EFW 2352 g 30%

## 2023-10-18 NOTE — Progress Notes (Signed)
 PRENATAL VISIT NOTE  Subjective:  Heather [name] is a 28 y.o. H6E9888 at [redacted]w[redacted]d being seen today for ongoing prenatal care.  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  Patient reports questions about insulin  not sure if she is dosing correctly. She previously had dexcom and freestyle libre, neither were sticking right. .  Contractions: Not present. Vag. Bleeding: None.  Movement: Present. Denies leaking of fluid.   The following portions of the patient's history were reviewed and updated as appropriate: allergies, current medications, past family history, past medical history, past social history, past surgical history and problem list.   Objective:    Vitals:   10/18/23 1504  BP: 124/86  Pulse: 71  Weight: 205 lb 9.6 oz (93.3 kg)    Fetal Status:      Movement: Present    General: Alert, oriented and cooperative. Patient is in no acute distress.  Skin: Skin is warm and dry. No rash noted.   Cardiovascular: Normal heart rate  noted  Respiratory: Normal respiratory effort, no problems with respiration noted  Abdomen: Soft, gravid, appropriate for gestational age.  Pain/Pressure: Absent     Pelvic: Cervical exam deferred        Extremities: Normal range of motion.     Mental Status: Normal mood and affect. Normal behavior. Normal judgment and thought content.   Fetal surveillance:   US  34+5 wks,cephalic,anterior placenta gr 3,BPP 8/8,FHR 152 bpm,AFI 17 cm,EFW 2352 g 30%   Assessment and Plan:  Pregnancy: H6E9888 at [redacted]w[redacted]d 1. Supervision of high risk pregnancy in third trimester (Primary)   2. Opioid use disorder Subutex  and klonopin  Scheduled reach clinic 10/21, encouraged to keep appt   3. Type 2 diabetes mellitus affecting pregnancy in second trimester, antepartum On metformin  1000mg  BID  NPH 15u BID  Novolog  with meals  Not checking sugar, does not currently have monitors, encouraged her to check fingersticks so we can see her sugar control CBG 91 today  Encouraged her to bring her insulin  with her Friday to go over drawing it up  10/17 NST, 10/20 ultrasound follow up   4. Depression with anxiety Zoloft  50mg  daily  Klonopin   5. History of gestational hypertension Continue ASA  6. Recurrent UTI Has not started macrobid  from hospital, encouraged her to start macrobid  nightly   7. [redacted] weeks gestation of pregnancy   8. HSV-2 infection On valtrex  daily    Preterm labor symptoms and general obstetric precautions including but not limited to vaginal bleeding, contractions, leaking of fluid and fetal movement were reviewed in detail with the patient. Please refer to After Visit Summary for other counseling recommendations.     Future Appointments  Date Time Provider Department Center  10/21/2023 11:10 AM [facility] NURSE [facility]  10/24/2023 10:45 AM [facility] US  [facility] None  10/24/2023 11:30 AM Jayne Vonn DEL, MD [facility]  10/25/2023  1:55 PM Lola Donnice HERO, MD  [facility]  10/27/2023 11:10 AM [facility] NURSE [facility]  10/31/2023 10:45 AM [facility] US  [facility] None  10/31/2023 11:30 AM Marilynn Nest, DO [facility]  11/03/2023 11:10 AM [facility] NURSE [facility]  11/07/2023 10:45 AM [facility] US  [facility] None  11/07/2023 11:30 AM Marilynn Nest, DO [facility]  11/10/2023 11:10 AM [facility] NURSE [facility]  11/14/2023 10:45 AM [facility] US  [facility] None  11/14/2023 11:50 AM Jayne Vonn DEL, MD [facility]  11/17/2023 11:10 AM [facility] NURSE [facility]  11/21/2023 10:45 AM [facility] US  [facility] None  11/21/2023 11:50 AM Jayne Vonn DEL, MD [facility]  11/24/2023 11:10 AM [facility] NURSE [facility]    Nidia Daring, FNP

## 2023-10-21 ENCOUNTER — Other Ambulatory Visit: Payer: MEDICAID

## 2023-10-24 ENCOUNTER — Ambulatory Visit: Payer: MEDICAID | Admitting: Obstetrics & Gynecology

## 2023-10-24 ENCOUNTER — Ambulatory Visit (INDEPENDENT_AMBULATORY_CARE_PROVIDER_SITE_OTHER): Payer: MEDICAID

## 2023-10-24 VITALS — BP 137/82 | HR 110 | Wt 203.0 lb

## 2023-10-24 DIAGNOSIS — F1190 Opioid use, unspecified, uncomplicated: Secondary | ICD-10-CM | POA: Diagnosis not present

## 2023-10-24 DIAGNOSIS — Z3A35 35 weeks gestation of pregnancy: Secondary | ICD-10-CM

## 2023-10-24 DIAGNOSIS — E119 Type 2 diabetes mellitus without complications: Secondary | ICD-10-CM

## 2023-10-24 DIAGNOSIS — Z794 Long term (current) use of insulin: Secondary | ICD-10-CM

## 2023-10-24 DIAGNOSIS — O09293 Supervision of pregnancy with other poor reproductive or obstetric history, third trimester: Secondary | ICD-10-CM

## 2023-10-24 DIAGNOSIS — F410 Panic disorder [episodic paroxysmal anxiety] without agoraphobia: Secondary | ICD-10-CM

## 2023-10-24 DIAGNOSIS — O0993 Supervision of high risk pregnancy, unspecified, third trimester: Secondary | ICD-10-CM

## 2023-10-24 DIAGNOSIS — O0992 Supervision of high risk pregnancy, unspecified, second trimester: Secondary | ICD-10-CM

## 2023-10-24 DIAGNOSIS — O24113 Pre-existing diabetes mellitus, type 2, in pregnancy, third trimester: Secondary | ICD-10-CM | POA: Diagnosis not present

## 2023-10-24 DIAGNOSIS — Z79899 Other long term (current) drug therapy: Secondary | ICD-10-CM

## 2023-10-24 DIAGNOSIS — O0990 Supervision of high risk pregnancy, unspecified, unspecified trimester: Secondary | ICD-10-CM

## 2023-10-24 MED ORDER — PAROXETINE HCL 20 MG PO TABS
ORAL_TABLET | ORAL | 2 refills | Status: AC
Start: 2023-10-24 — End: ?

## 2023-10-24 NOTE — Progress Notes (Signed)
 US  35+4 wks,cephalic,BPP 8/8,anterior placenta gr 3,FHR 139 bpm,AFI 14 cm

## 2023-10-24 NOTE — Progress Notes (Signed)
 HIGH-RISK PREGNANCY VISIT Patient name: Heather [name] MRN [id number]  Date of birth: [birth year] Chief Complaint:   Routine Prenatal Visit  History of Present Illness:   Heather [name] is a 28 y.o. G3P0111 female at [redacted]w[redacted]d with an Estimated Date of Delivery: 11/24/23 being seen today for ongoing management of a high-risk pregnancy complicated by     ICD-10-CM   1. Supervision of high risk pregnancy, antepartum  O09.90 CANCELED: POC Urinalysis Dipstick OB    2. Type 2 diabetes mellitus treated with insulin  (HCC)  E11.9 CANCELED: POC Urinalysis Dipstick OB   Z79.4     3. [redacted] weeks gestation of pregnancy  Z3A.35 CANCELED: POC Urinalysis Dipstick OB    4. Opioid use disorder: Suboxone  8 mg 4 x per day  F11.90     5. Chronic prescription benzodiazepine use: klonipen 1 mg TID  Z79.899     6. Panic attacks: start Paxl 20 mg qhs  F41.0      .    Today she reports no complaints. Contractions: Not present. Vag. Bleeding: None.  Movement: Present. denies leaking of fluid.      05/24/2023    2:24 PM 10/18/2016   11:25 AM 10/04/2016    2:43 PM 02/02/2016    9:45 AM  Depression screen PHQ 2/9  Decreased Interest 1 0 0 0  Down, Depressed, Hopeless 0 0 0 0  PHQ - 2 Score 1 0 0 0  Altered sleeping 1     Tired, decreased energy 3     Change in appetite 0     Feeling bad or failure about yourself  1     Trouble concentrating 1     Moving slowly or fidgety/restless 0     Suicidal thoughts 0     PHQ-9 Score 7           05/24/2023    2:25 PM  GAD 7 : Generalized Anxiety Score  Nervous, Anxious, on Edge 0  Control/stop worrying 0  Worry too much - different things 2  Trouble relaxing 1  Restless 0  Easily annoyed or irritable 3  Afraid - awful might happen 0  Total GAD 7 Score 6     Review of Systems:   Pertinent items are noted in HPI Denies abnormal vaginal discharge w/ itching/odor/irritation, headaches, visual changes, shortness of breath, chest pain, abdominal pain,  severe nausea/vomiting, or problems with urination or bowel movements unless otherwise stated above. Pertinent History Reviewed:  Reviewed past medical,surgical, social, obstetrical and family history.  Reviewed problem list, medications and allergies. Physical Assessment:   Vitals:   10/24/23 1119 10/24/23 1137  BP: (!) 152/93 137/82  Pulse: 98 (!) 110  Weight: 203 lb (92.1 kg)   Body mass index is 33.78 kg/m.           Physical Examination:   General appearance: alert, well appearing, and in no distress  Mental status: alert, oriented to person, place, and time  Skin: warm & dry   Extremities:      Cardiovascular: normal heart rate noted  Respiratory: normal respiratory effort, no distress  Abdomen: gravid, soft, non-tender  Pelvic: Cervical exam deferred         Fetal Status:     Movement: Present    Fetal Surveillance Testing today: BPP 8/8   Chaperone:     No results found for this or any previous visit (from the past 24 hours).  Assessment & Plan:  High-risk pregnancy: H6E9888 at  [redacted]w[redacted]d with an Estimated Date of Delivery: 11/24/23      ICD-10-CM   1. Supervision of high risk pregnancy, antepartum  O09.90 CANCELED: POC Urinalysis Dipstick OB    2. Type 2 diabetes mellitus treated with insulin  (HCC)  E11.9 CANCELED: POC Urinalysis Dipstick OB   Z79.4     3. [redacted] weeks gestation of pregnancy  Z3A.35 CANCELED: POC Urinalysis Dipstick OB    4. Opioid use disorder: Suboxone  8 mg 4 x per day  F11.90     5. Chronic prescription benzodiazepine use: klonipen 1 mg TID  Z79.899     6. Panic attacks: start Paxl 20 mg qhs  F41.0          Meds:  Meds ordered this encounter  Medications   PARoxetine (PAXIL) 20 MG tablet    Sig: 1 daily in the evening    Dispense:  30 tablet    Refill:  2    Orders: No orders of the defined types were placed in this encounter.    Labs/procedures today: U/S  Treatment Plan:  keep scheduled    Follow-up: Return for keep  scheduled.   Future Appointments  Date Time Provider Department Center  10/25/2023  1:55 PM Lola Donnice HERO, MD [facility]  10/27/2023 11:10 AM [facility] NURSE [facility]  10/31/2023 10:45 AM [facility] US  [facility] None  10/31/2023 11:30 AM Ozan, Jennifer, DO [facility]  11/03/2023 11:10 AM [facility] NURSE [facility]  11/07/2023 10:45 AM [facility] US  [facility] None  11/07/2023 11:30 AM Marilynn Nest, DO [facility]  11/10/2023 11:10 AM [facility] NURSE [facility]  11/14/2023 10:45 AM [facility] US  [facility] None  11/14/2023 11:50 AM Jayne Vonn DEL, MD [facility]  11/17/2023 11:10 AM [facility] NURSE [facility]  11/21/2023 10:45 AM [facility] US  [facility] None  11/21/2023 11:50 AM Jayne Vonn DEL, MD [facility]  11/24/2023 11:10 AM [facility] NURSE [facility]    No orders of the defined types were placed in this encounter.  Vonn DEL Jayne  Attending Physician for the Center for [facility] 10/24/2023 12:14 PM

## 2023-10-25 ENCOUNTER — Encounter: Payer: MEDICAID | Admitting: Family Medicine

## 2023-10-27 ENCOUNTER — Other Ambulatory Visit: Payer: MEDICAID

## 2023-10-31 ENCOUNTER — Ambulatory Visit (INDEPENDENT_AMBULATORY_CARE_PROVIDER_SITE_OTHER): Payer: MEDICAID

## 2023-10-31 ENCOUNTER — Ambulatory Visit: Payer: MEDICAID | Admitting: Obstetrics & Gynecology

## 2023-10-31 ENCOUNTER — Other Ambulatory Visit (HOSPITAL_COMMUNITY)
Admission: RE | Admit: 2023-10-31 | Discharge: 2023-10-31 | Disposition: A | Discharge status: Home / Self Care | Payer: MEDICAID | Source: Ambulatory Visit | Attending: Obstetrics & Gynecology | Admitting: Obstetrics & Gynecology

## 2023-10-31 VITALS — BP 124/75 | HR 73 | Wt 200.2 lb

## 2023-10-31 DIAGNOSIS — O99323 Drug use complicating pregnancy, third trimester: Secondary | ICD-10-CM

## 2023-10-31 DIAGNOSIS — Z3A36 36 weeks gestation of pregnancy: Secondary | ICD-10-CM | POA: Insufficient documentation

## 2023-10-31 DIAGNOSIS — E119 Type 2 diabetes mellitus without complications: Secondary | ICD-10-CM

## 2023-10-31 DIAGNOSIS — O0990 Supervision of high risk pregnancy, unspecified, unspecified trimester: Secondary | ICD-10-CM

## 2023-10-31 DIAGNOSIS — Z794 Long term (current) use of insulin: Secondary | ICD-10-CM

## 2023-10-31 DIAGNOSIS — F418 Other specified anxiety disorders: Secondary | ICD-10-CM

## 2023-10-31 DIAGNOSIS — O0993 Supervision of high risk pregnancy, unspecified, third trimester: Secondary | ICD-10-CM

## 2023-10-31 DIAGNOSIS — F1190 Opioid use, unspecified, uncomplicated: Secondary | ICD-10-CM

## 2023-10-31 DIAGNOSIS — O24113 Pre-existing diabetes mellitus, type 2, in pregnancy, third trimester: Secondary | ICD-10-CM | POA: Diagnosis not present

## 2023-10-31 DIAGNOSIS — F1120 Opioid dependence, uncomplicated: Secondary | ICD-10-CM | POA: Diagnosis not present

## 2023-10-31 DIAGNOSIS — N898 Other specified noninflammatory disorders of vagina: Secondary | ICD-10-CM

## 2023-10-31 DIAGNOSIS — O26893 Other specified pregnancy related conditions, third trimester: Secondary | ICD-10-CM | POA: Insufficient documentation

## 2023-10-31 DIAGNOSIS — O0992 Supervision of high risk pregnancy, unspecified, second trimester: Secondary | ICD-10-CM

## 2023-10-31 LAB — GLUCOSE, POCT (MANUAL RESULT ENTRY): POC Glucose: 90 mg/dL (ref 70–99)

## 2023-10-31 MED ORDER — BUPRENORPHINE HCL 8 MG SL SUBL: SUBLINGUAL_TABLET | SUBLINGUAL | 0 refills | Status: DC

## 2023-10-31 MED ORDER — CLONAZEPAM 1 MG PO TABS: 1.0000 mg | ORAL_TABLET | Freq: Three times a day (TID) | ORAL | 0 refills | Status: DC

## 2023-10-31 NOTE — Progress Notes (Signed)
 US  36+4 wks,cephalic,BPP 8/8,anterior placenta gr 3,AFI 12 cm,FHR 151 bpm

## 2023-10-31 NOTE — Progress Notes (Signed)
 HIGH-RISK PREGNANCY VISIT Patient name: Heather [name] MRN [id number]  Date of birth: 1995-04-14 Chief Complaint:   Routine Prenatal Visit  History of Present Illness:   Heather [name] is a 28 y.o. G3P0111 female at [redacted]w[redacted]d with an Estimated Date of Delivery: 11/24/23 being seen today for ongoing management of a high-risk pregnancy complicated by:  -Non-compliant T2DM Pt does not have log Asked pt how much insulin  she is currently using and she wasn't sure.  Reports it used to the pens, but switch to where she has to draw on her own and feeling confused -sounds like she is doing NPH 15units at night   and Humalog 6-8u TID  OUD, anxiety - Some improvement with the Paxil at night  Notes some pelvic pressure, but denies regular contractions. Contractions: Not present. Vag. Bleeding: None.  Movement: Present. denies leaking of fluid.      05/24/2023    2:24 PM 10/18/2016   11:25 AM 10/04/2016    2:43 PM 02/02/2016    9:45 AM  Depression screen PHQ 2/9  Decreased Interest 1 0 0 0  Down, Depressed, Hopeless 0 0 0 0  PHQ - 2 Score 1 0 0 0  Altered sleeping 1     Tired, decreased energy 3     Change in appetite 0     Feeling bad or failure about yourself  1     Trouble concentrating 1     Moving slowly or fidgety/restless 0     Suicidal thoughts 0     PHQ-9 Score 7        Current Outpatient Medications  Medication Instructions   aspirin  EC 162 mg, Oral, Daily, Swallow whole.   buprenorphine  (SUBUTEX ) 8 MG SUBL SL tablet Take 1 under the tongue 4 times daily   clonazePAM  (KLONOPIN ) 1 mg, Oral, 3 times daily   FeroSul 325 mg, Oral, Every other day   gabapentin  (NEURONTIN ) 800 mg, Oral, 3 times daily   HumuLIN N 15 Units, Subcutaneous, 2 times daily before breakfast and at bedtime   metFORMIN  (GLUCOPHAGE -XR) 1,000 mg, Oral, 2 times daily with meals   metoCLOPramide  (REGLAN ) 10 mg, Oral, 3 times daily with meals   nitrofurantoin  (macrocrystal-monohydrate) (MACROBID ) 100 mg, Oral,  Daily at bedtime, Begin after completion of Omnicef (cefdinir)   NovoLOG  8 Units, Subcutaneous, 3 times daily with meals   omeprazole  (PRILOSEC) 20 mg, Oral, Daily, 1 tablet a day   ondansetron  (ZOFRAN -ODT) 8 mg, Oral, Every 8 hours PRN   PARoxetine (PAXIL) 20 MG tablet 1 daily in the evening   Prenatal Vit-Fe Fumarate-FA (PRENATAL VITAMIN PLUS LOW IRON ) 27-1 MG TABS 1 tablet, Oral, Daily   sertraline  (ZOLOFT ) 50 mg, Oral, Daily   UltiCare Insulin  Syringe 1 Units, Does not apply, As directed   valACYclovir  (VALTREX ) 500 mg, Oral, 2 times daily     Review of Systems:   Pertinent items are noted in HPI Denies abnormal vaginal discharge w/ itching/odor/irritation, headaches, visual changes, shortness of breath, chest pain, abdominal pain, severe nausea/vomiting, or problems with urination or bowel movements unless otherwise stated above. Pertinent History Reviewed:  Reviewed past medical,surgical, social, obstetrical and family history.  Reviewed problem list, medications and allergies. Physical Assessment:   Vitals:   10/31/23 1125  BP: 124/75  Pulse: 73  Weight: 200 lb 3.2 oz (90.8 kg)  Body mass index is 33.32 kg/m.           Physical Examination:   General appearance: alert, no acute distress  Mental status: normal mood, behavior, speech, dress, motor activity, and thought processes  Skin: warm & dry   Extremities:   no eema   Cardiovascular: normal heart rate noted  Respiratory: normal respiratory effort, no distress  Abdomen: gravid, soft, non-tender  Pelvic: Cervical exam performed closed/soft/-3, GBS obtained  Thick white discharge noted        Fetal Status:     Movement: Present    Fetal Surveillance Testing today: cephalic,BPP 8/8,anterior placenta gr 3,AFI 12 cm,FHR 151 bpm    Chaperone: Aleck Blase    Results for orders placed or performed in visit on 10/31/23 (from the past 24 hours)  POCT glucose (manual entry)   Collection Time: 10/31/23 11:52 AM  Result  Value Ref Range   POC Glucose 90 70 - 99 mg/dl     Assessment & Plan:  High-risk pregnancy: H6E9888 at [redacted]w[redacted]d with an Estimated Date of Delivery: 11/24/23   1) OUD,anxiety - Continue current medication Klonopin  and Suboxone  sent in  -plan for [facility] clinic postdelivery - Continue Zoloft  and Paxil  2) T2DM -fasting sugar normal this am- pt states she has not eaten since yesterday evening -discussed my concerns regarding non-compliance and recommendation to proceed with delivery ~ 37-38wks -scheduled IOL 11/3 (orders placed []  will needs to add PCN if GBS positive)  -continue antepartum testing  3) Recurrent UTI On suppression  4) HSV -asymptomatic -taking valtrex   Meds:  Meds ordered this encounter  Medications   clonazePAM  (KLONOPIN ) 1 MG tablet    Sig: Take 1 tablet (1 mg total) by mouth 3 (three) times daily for 14 days.    Dispense:  42 tablet    Refill:  0   buprenorphine  (SUBUTEX ) 8 MG SUBL SL tablet    Sig: Take 1 under the tongue 4 times daily    Dispense:  56 tablet    Refill:  0    Labs/procedures today: GBS, GC/C, BPP 8/8  Treatment Plan:  as outlined above  Reviewed: Preterm labor symptoms and general obstetric precautions including but not limited to vaginal bleeding, contractions, leaking of fluid and fetal movement were reviewed in detail with the patient.  All questions were answered.   Follow-up: Return for weekly as scheduled, HROB visit.   Future Appointments  Date Time Provider Department Center  11/03/2023 11:10 AM [facility] NURSE [facility]  11/07/2023  7:00 AM [facility] SCHED ROOM [facility] None    Orders Placed This Encounter  Procedures   Culture, beta strep (group b only)   POCT glucose (manual entry)    Heather Cajuste, DO Attending Obstetrician & Gynecologist, [facility] Center for [employer], [facility]

## 2023-11-01 ENCOUNTER — Other Ambulatory Visit: Payer: Self-pay

## 2023-11-01 ENCOUNTER — Ambulatory Visit: Payer: Self-pay | Admitting: Obstetrics & Gynecology

## 2023-11-01 ENCOUNTER — Telehealth (HOSPITAL_COMMUNITY): Payer: Self-pay | Admitting: *Deleted

## 2023-11-01 ENCOUNTER — Encounter (HOSPITAL_COMMUNITY): Payer: Self-pay | Admitting: Obstetrics and Gynecology

## 2023-11-01 ENCOUNTER — Encounter (HOSPITAL_COMMUNITY): Payer: Self-pay

## 2023-11-01 ENCOUNTER — Inpatient Hospital Stay (HOSPITAL_COMMUNITY): Payer: MEDICAID

## 2023-11-01 ENCOUNTER — Inpatient Hospital Stay (HOSPITAL_COMMUNITY)
Admission: AD | Admit: 2023-11-01 | Discharge: 2023-11-04 | DRG: 806 | Disposition: A | Discharge status: Home / Self Care | Payer: MEDICAID | Attending: Family Medicine | Admitting: Family Medicine

## 2023-11-01 DIAGNOSIS — B9689 Other specified bacterial agents as the cause of diseases classified elsewhere: Secondary | ICD-10-CM

## 2023-11-01 DIAGNOSIS — Z79899 Other long term (current) drug therapy: Secondary | ICD-10-CM | POA: Diagnosis not present

## 2023-11-01 DIAGNOSIS — O2412 Pre-existing diabetes mellitus, type 2, in childbirth: Secondary | ICD-10-CM | POA: Diagnosis present

## 2023-11-01 DIAGNOSIS — F1190 Opioid use, unspecified, uncomplicated: Secondary | ICD-10-CM | POA: Diagnosis present

## 2023-11-01 DIAGNOSIS — O99324 Drug use complicating childbirth: Secondary | ICD-10-CM | POA: Diagnosis present

## 2023-11-01 DIAGNOSIS — O2343 Unspecified infection of urinary tract in pregnancy, third trimester: Secondary | ICD-10-CM | POA: Diagnosis present

## 2023-11-01 DIAGNOSIS — N390 Urinary tract infection, site not specified: Secondary | ICD-10-CM | POA: Diagnosis present

## 2023-11-01 DIAGNOSIS — F319 Bipolar disorder, unspecified: Secondary | ICD-10-CM | POA: Diagnosis present

## 2023-11-01 DIAGNOSIS — E1165 Type 2 diabetes mellitus with hyperglycemia: Secondary | ICD-10-CM | POA: Diagnosis present

## 2023-11-01 DIAGNOSIS — K219 Gastro-esophageal reflux disease without esophagitis: Secondary | ICD-10-CM | POA: Diagnosis present

## 2023-11-01 DIAGNOSIS — Z794 Long term (current) use of insulin: Secondary | ICD-10-CM | POA: Diagnosis not present

## 2023-11-01 DIAGNOSIS — O99344 Other mental disorders complicating childbirth: Secondary | ICD-10-CM | POA: Diagnosis present

## 2023-11-01 DIAGNOSIS — A6000 Herpesviral infection of urogenital system, unspecified: Secondary | ICD-10-CM | POA: Diagnosis present

## 2023-11-01 DIAGNOSIS — Z3A36 36 weeks gestation of pregnancy: Secondary | ICD-10-CM

## 2023-11-01 DIAGNOSIS — O24112 Pre-existing diabetes mellitus, type 2, in pregnancy, second trimester: Secondary | ICD-10-CM | POA: Diagnosis present

## 2023-11-01 DIAGNOSIS — O24424 Gestational diabetes mellitus in childbirth, insulin controlled: Secondary | ICD-10-CM | POA: Diagnosis not present

## 2023-11-01 DIAGNOSIS — O0993 Supervision of high risk pregnancy, unspecified, third trimester: Secondary | ICD-10-CM

## 2023-11-01 DIAGNOSIS — Z833 Family history of diabetes mellitus: Secondary | ICD-10-CM | POA: Diagnosis not present

## 2023-11-01 DIAGNOSIS — O9962 Diseases of the digestive system complicating childbirth: Secondary | ICD-10-CM | POA: Diagnosis present

## 2023-11-01 DIAGNOSIS — F419 Anxiety disorder, unspecified: Secondary | ICD-10-CM | POA: Diagnosis present

## 2023-11-01 DIAGNOSIS — O0992 Supervision of high risk pregnancy, unspecified, second trimester: Principal | ICD-10-CM

## 2023-11-01 DIAGNOSIS — Z3490 Encounter for supervision of normal pregnancy, unspecified, unspecified trimester: Secondary | ICD-10-CM

## 2023-11-01 DIAGNOSIS — O368130 Decreased fetal movements, third trimester, not applicable or unspecified: Secondary | ICD-10-CM | POA: Diagnosis not present

## 2023-11-01 DIAGNOSIS — O99214 Obesity complicating childbirth: Secondary | ICD-10-CM | POA: Diagnosis present

## 2023-11-01 DIAGNOSIS — O9832 Other infections with a predominantly sexual mode of transmission complicating childbirth: Secondary | ICD-10-CM | POA: Diagnosis present

## 2023-11-01 DIAGNOSIS — O9902 Anemia complicating childbirth: Secondary | ICD-10-CM | POA: Diagnosis present

## 2023-11-01 DIAGNOSIS — Z91199 Patient's noncompliance with other medical treatment and regimen due to unspecified reason: Secondary | ICD-10-CM | POA: Diagnosis not present

## 2023-11-01 DIAGNOSIS — O9912 Other diseases of the blood and blood-forming organs and certain disorders involving the immune mechanism complicating childbirth: Secondary | ICD-10-CM | POA: Diagnosis not present

## 2023-11-01 DIAGNOSIS — E119 Type 2 diabetes mellitus without complications: Secondary | ICD-10-CM

## 2023-11-01 DIAGNOSIS — O4202 Full-term premature rupture of membranes, onset of labor within 24 hours of rupture: Secondary | ICD-10-CM | POA: Diagnosis not present

## 2023-11-01 DIAGNOSIS — B009 Herpesviral infection, unspecified: Secondary | ICD-10-CM | POA: Diagnosis present

## 2023-11-01 LAB — URINALYSIS, ROUTINE W REFLEX MICROSCOPIC
Bilirubin Urine: NEGATIVE
Glucose, UA: NEGATIVE mg/dL
Hgb urine dipstick: NEGATIVE
Ketones, ur: NEGATIVE mg/dL
Nitrite: POSITIVE — AB
Protein, ur: 30 mg/dL — AB
Specific Gravity, Urine: 1.016 (ref 1.005–1.030)
WBC, UA: >50 WBC/hpf (ref 0–5)
pH: 5 (ref 5.0–8.0)

## 2023-11-01 LAB — RUPTURE OF MEMBRANE (ROM)PLUS: Rom Plus: NEGATIVE

## 2023-11-01 LAB — CERVICOVAGINAL ANCILLARY ONLY
Bacterial Vaginitis (gardnerella): POSITIVE — AB
Candida Glabrata: NEGATIVE
Candida Vaginitis: NEGATIVE
Chlamydia: NEGATIVE
Comment: NEGATIVE
Comment: NEGATIVE
Comment: NEGATIVE
Comment: NEGATIVE
Comment: NORMAL
Neisseria Gonorrhea: NEGATIVE

## 2023-11-01 LAB — WET PREP, GENITAL
Sperm: NONE SEEN
Trich, Wet Prep: NONE SEEN
WBC, Wet Prep HPF POC: <10 (ref <10)
Yeast Wet Prep HPF POC: NONE SEEN

## 2023-11-01 LAB — GLUCOSE, CAPILLARY: Glucose-Capillary: 111 mg/dL — ABNORMAL HIGH (ref 70–99)

## 2023-11-01 MED ORDER — METRONIDAZOLE 0.75 % VA GEL: 1.0000 | Freq: Every day | VAGINAL | 0 refills | Status: DC

## 2023-11-01 MED ORDER — ACETAMINOPHEN 325 MG PO TABS: 650.0000 mg | ORAL_TABLET | ORAL | Status: DC | PRN

## 2023-11-01 MED ORDER — LACTATED RINGERS IV SOLN: INTRAVENOUS | Status: DC

## 2023-11-01 MED ORDER — ONDANSETRON HCL 4 MG/2ML IJ SOLN
4.0000 mg | Freq: Four times a day (QID) | INTRAMUSCULAR | Status: DC | PRN
  Administered 2023-11-02: 4 mg via INTRAVENOUS
  Filled 2023-11-01: qty 2

## 2023-11-01 MED ORDER — HYDROXYZINE HCL 50 MG PO TABS: 50.0000 mg | ORAL_TABLET | Freq: Four times a day (QID) | ORAL | Status: DC | PRN

## 2023-11-01 MED ORDER — OXYCODONE-ACETAMINOPHEN 5-325 MG PO TABS: 2.0000 | ORAL_TABLET | ORAL | Status: DC | PRN

## 2023-11-01 MED ORDER — OXYTOCIN-SODIUM CHLORIDE 30-0.9 UT/500ML-% IV SOLN: 2.5000 [IU]/h | INTRAVENOUS | Status: DC

## 2023-11-01 MED ORDER — OXYCODONE-ACETAMINOPHEN 5-325 MG PO TABS: 1.0000 | ORAL_TABLET | ORAL | Status: DC | PRN

## 2023-11-01 MED ORDER — SOD CITRATE-CITRIC ACID 500-334 MG/5ML PO SOLN: 30.0000 mL | ORAL | Status: DC | PRN

## 2023-11-01 MED ORDER — LACTATED RINGERS IV SOLN: 500.0000 mL | INTRAVENOUS | Status: DC | PRN

## 2023-11-01 MED ORDER — LIDOCAINE HCL (PF) 1 % IJ SOLN: 30.0000 mL | INTRAMUSCULAR | Status: DC | PRN

## 2023-11-01 MED ORDER — OXYTOCIN BOLUS FROM INFUSION
333.0000 mL | Freq: Once | INTRAVENOUS | Status: AC
  Administered 2023-11-02: 333 mL via INTRAVENOUS

## 2023-11-01 NOTE — MAU Note (Signed)
 Heather [name] is a 28 y.o. at [redacted]w[redacted]d here in MAU reporting: foul smell in urine, vaginal pressure, LOF after urinating, SOB, and dizzy.  Decreased FM reported- last FM yesterday 2255, blood in urine from [facility] in urine- on macrobid .  States- MD wanted to induce me yesterday but earliest it could be done is Monday due to type 1 DM. LMP:  Onset of complaint: days Pain score: 10/10 pressure, 7/10 back Vitals:   11/01/23 1733  BP: 117/70  Pulse: 78  Resp: 16  Temp: 98 F (36.7 C)  SpO2: 100%     FHT: 134  Lab orders placed from triage: ua

## 2023-11-01 NOTE — Telephone Encounter (Signed)
 Preadmission screen

## 2023-11-01 NOTE — H&P (Incomplete)
 OBSTETRIC ADMISSION HISTORY AND PHYSICAL  Heather [name] is a 28 y.o. female [phone] with IUP at [redacted]w[redacted]d by US  at 7wks presenting for IOL of poorly controlled diabetes. She came into the MAU for evaluation of SROM. She reports +FMs, LOF, no VB, no blurry vision, headaches or peripheral edema, and RUQ pain.  She plans on breast feeding and bottle feeding. She request PP IUD for birth control. She received her prenatal care at [facility].  Dating: By priscilla U/S --->  Estimated Date of Delivery: 11/24/23  Sono:    @[redacted]w[redacted]d , CWD, normal anatomy, cephalic presentation, anterior placenta, 2352g, 30% EFW   Prenatal History/Complications:   Past Medical History: Past Medical History:  Diagnosis Date   ADHD (attention deficit hyperactivity disorder)    Anemia    Anxiety    Asthma    as a child   Bipolar 1 disorder (HCC)    Depression    Diabetes mellitus    type 2   Diabetic neuropathy (HCC)    Fatty liver    GHTN vs. CHTN 06/11/2015   Hidradenitis 06/05/2012   History of hiatal hernia    Mononucleosis 01/2012   Obesity (BMI 30-39.9)    Other and unspecified ovarian cyst 07/24/2012   Right  Ovarian cyst 8 cm on US  7/15 in ER   Pneumonia    Schizo affective schizophrenia (HCC)    Seizures (HCC)    febrile seizures as a child   Sleep apnea    no cpap   UTI (urinary tract infection) during pregnancy     Past Surgical History: Past Surgical History:  Procedure Laterality Date   ADENOIDECTOMY     ANTERIOR CRUCIATE LIGAMENT REPAIR     INCISION AND DRAINAGE ABSCESS     Recurrent abscesses of labia and groin area with I and D in office and ER   TONSILLECTOMY     WISDOM TOOTH EXTRACTION      Obstetrical History: OB History     Gravida  3   Para  1   Term      Preterm  1   AB  1   Living  1      SAB  1   IAB      Ectopic      Multiple      Live Births  1        Obstetric Comments  2017 started contracting at 4 wks, PPROM at 59wks, [facility]         Social  History Social History   Socioeconomic History   Marital status: Married    Spouse name: Not on file   Number of children: 1   Years of education: Not on file   Highest education level: Not on file  Occupational History   Not on file  Tobacco Use   Smoking status: Never   Smokeless tobacco: Never  Vaping Use   Vaping status: Never Used  Substance and Sexual Activity   Alcohol use: Not Currently   Drug use: Not Currently    Types: Oxycodone , Hydrocodone , Fentanyl     Comment: prescibed meds only   Sexual activity: Yes    Birth control/protection: None  Other Topics Concern   Not on file  Social History Narrative   Not on file   Social Drivers of Health   Financial Resource Strain: Low Risk  (08/10/2023)   Received from [facility]   Overall Financial Resource Strain (CARDIA)    How hard is it for  you to pay for the very basics like food, housing, medical care, and heating?: Not very hard  Recent Concern: Financial Resource Strain - Medium Risk (05/24/2023)   Overall Financial Resource Strain (CARDIA)    Difficulty of Paying Living Expenses: Somewhat hard  Food Insecurity: No Food Insecurity (10/08/2023)   Hunger Vital Sign    Worried About Running Out of Food in the Last Year: Never true    Ran Out of Food in the Last Year: Never true  Transportation Needs: No Transportation Needs (10/08/2023)   PRAPARE - Administrator, Civil Service (Medical): No    Lack of Transportation (Non-Medical): No  Physical Activity: Inactive (05/24/2023)   Exercise Vital Sign    Days of Exercise per Week: 0 days    Minutes of Exercise per Session: 0 min  Stress: Stress Concern Present (05/24/2023)   [employer] of Occupational Health - Occupational Stress Questionnaire    Feeling of Stress : To some extent  Social Connections: Moderately Integrated (05/24/2023)   Social Connection and Isolation Panel    Frequency of Communication with Friends and Family: More than three  times a week    Frequency of Social Gatherings with Friends and Family: Twice a week    Attends Religious Services: More than 4 times per year    Active Member of [employer] or Organizations: Yes    Attends Banker Meetings: 1 to 4 times per year    Marital Status: Separated    Family History: Family History  Problem Relation Age of Onset   Diabetes Mother    Bipolar disorder Father    Post-traumatic stress disorder Father    Asthma Sister        intrinsic   Bipolar disorder Brother    Anxiety disorder Brother    Stroke Maternal Grandmother    Cancer Other        colon, lung, brain-paternal side    Allergies: Allergies  Allergen Reactions   Prozac [Fluoxetine Hcl]     Stiffness in neck, stroke like symptoms   Cephalosporins Other (See Comments)    Unknown childhood reaction; states took last month at the hospital with benadryl  and was okay.   Latuda [Lurasidone Hcl]     Tongue swelling, confusion     Medications Prior to Admission  Medication Sig Dispense Refill Last Dose/Taking   buprenorphine  (SUBUTEX ) 8 MG SUBL SL tablet Take 1 under the tongue 4 times daily 56 tablet 0 11/01/2023   clonazePAM  (KLONOPIN ) 1 MG tablet Take 1 tablet (1 mg total) by mouth 3 (three) times daily for 14 days. 42 tablet 0 11/01/2023   ferrous sulfate  325 (65 FE) MG tablet Take 1 tablet (325 mg total) by mouth every other day. 45 tablet 3 11/01/2023   gabapentin  (NEURONTIN ) 800 MG tablet Take 1 tablet (800 mg total) by mouth 3 (three) times daily. 90 tablet 6 11/01/2023   insulin  aspart (NOVOLOG ) 100 UNIT/ML injection Inject 8 Units into the skin 3 (three) times daily with meals. 10 mL 11 11/01/2023 at  1:30 PM   insulin  NPH Human (NOVOLIN N) 100 UNIT/ML injection Inject 0.15 mLs (15 Units total) into the skin 2 (two) times daily at 8 am and 10 pm. 10 mL 11 11/01/2023   metFORMIN  (GLUCOPHAGE -XR) 500 MG 24 hr tablet Take 2 tablets (1,000 mg total) by mouth 2 (two) times daily with a meal.  120 tablet 11 11/01/2023   nitrofurantoin , macrocrystal-monohydrate, (MACROBID ) 100 MG capsule Take 1 capsule (  100 mg total) by mouth at bedtime. Begin after completion of Omnicef (cefdinir) 90 capsule 1 11/01/2023   omeprazole  (PRILOSEC) 20 MG capsule Take 1 capsule (20 mg total) by mouth daily. 1 tablet a day 30 capsule 6 11/01/2023   ondansetron  (ZOFRAN -ODT) 8 MG disintegrating tablet Take 1 tablet (8 mg total) by mouth every 8 (eight) hours as needed for nausea or vomiting. 90 tablet 1 11/01/2023   PARoxetine (PAXIL) 20 MG tablet 1 daily in the evening 30 tablet 2 11/01/2023   Prenatal Vit-Fe Fumarate-FA (PRENATAL VITAMIN PLUS LOW IRON ) 27-1 MG TABS Take 1 tablet by mouth daily at 6 (six) AM. 90 tablet 4 11/01/2023   aspirin  EC 81 MG tablet Take 2 tablets (162 mg total) by mouth daily. Swallow whole. (Patient not taking: Reported on 10/31/2023) 180 tablet 2    Insulin  Syringe-Needle U-100 30G X 1/2 0.5 ML MISC 1 Units by Does not apply route as directed. 100 each 3    metoCLOPramide  (REGLAN ) 10 MG tablet Take 1 tablet (10 mg total) by mouth 3 (three) times daily with meals for 10 days. 30 tablet 0    metroNIDAZOLE  (METROGEL ) 0.75 % vaginal gel Place 1 Applicatorful vaginally at bedtime for 5 days. 70 g 0    sertraline  (ZOLOFT ) 50 MG tablet Take 1 tablet (50 mg total) by mouth daily. 30 tablet 11    valACYclovir  (VALTREX ) 500 MG tablet Take 1 tablet (500 mg total) by mouth 2 (two) times daily. 60 tablet 3      Review of Systems   All systems reviewed and negative except as stated in HPI  Blood pressure 131/67, pulse 73, temperature 98 F (36.7 C), temperature source Oral, resp. rate 16, height 5' 5 (1.651 m), weight 95.3 kg, SpO2 97%. General appearance: alert, cooperative, and appears stated age Lungs: clear to auscultation bilaterally Heart: regular rate and rhythm Abdomen: soft, non-tender; bowel sounds normal Pelvic: Done by the MAU, no active HSV outbreak noted Extremities: no  edema, no sign of DVT Presentation: cephalic Fetal monitoringBaseline: 140 bpm, Variability: Good {> 6 bpm), Accelerations: Reactive, and Decelerations: Absent Uterine activity not noted     Prenatal labs: ABO, Rh: --/--/O POS (10/02 2314) Antibody: NEG (10/02 2314) Rubella: 12.10 (05/20 1452) RPR: Non Reactive (09/16 1410)  HBsAg: Negative (05/20 1452)  HIV: Non Reactive (05/20 1452)  GBS:      Lab Results  Component Value Date   GBS NEGATIVE 09/03/2015   GTT: A1c 7.2. Genetic screening:  NIPS LR female Anatomy US  completed, no abnormalities  Immunization History  Administered Date(s) Administered   DTaP 08/15/1996, 10/16/1996, 12/19/1996, 02/13/1998, 07/18/2001   Fluzone Influenza virus vaccine,trivalent (IIV3), split virus 11/04/2010   H1N1 11/09/2007   HIB (PRP-OMP) 08/15/1996, 10/16/1996, 02/13/1998   HPV Quadrivalent 11/03/2010, 05/17/2013   Hepatitis A, Ped/Adol-2 Dose 05/17/2013   Hepatitis B 1996[birth year]03[birth year]07, 08/15/1996, 12/19/1996   IPV 08/15/1996, 10/16/1996, 02/13/1998, 07/18/2001   Influenza Inj Mdck Quad Pf 10/14/2011   Influenza Whole 11/03/2005, 11/15/2006, 11/09/2007, 09/13/2008, 09/12/2009, 11/03/2010   Influenza, Seasonal, Injecte, Preservative Fre 10/14/2011   Influenza,inj,Quad PF,6+ Mos 10/16/2013   Influenza,inj,quad, With Preservative 12/30/2020   Influenza-Unspecified 11/04/2010, 10/23/2012   MMR 07/15/1997, 07/18/2001, 01/02/2021   Meningococcal Conjugate 09/13/2008   PPD Test 12/30/2020   Pneumococcal Conjugate-13 07/31/1999   Td 08/22/2007   Tdap 08/22/2007, 06/27/2014, 12/30/2020   Varicella 07/15/1997, 09/13/2008      Results for orders placed or performed during the hospital encounter of 11/01/23 (from the past 24 hours)  Urinalysis, Routine w reflex microscopic -Urine, Clean Catch   Collection Time: 11/01/23  9:20 PM  Result Value Ref Range   Color, Urine YELLOW YELLOW   APPearance CLOUDY (A) CLEAR   Specific Gravity, Urine 1.016 1.005  - 1.030   pH 5.0 5.0 - 8.0   Glucose, UA NEGATIVE NEGATIVE mg/dL   Hgb urine dipstick NEGATIVE NEGATIVE   Bilirubin Urine NEGATIVE NEGATIVE   Ketones, ur NEGATIVE NEGATIVE mg/dL   Protein, ur 30 (A) NEGATIVE mg/dL   Nitrite POSITIVE (A) NEGATIVE   Leukocytes,Ua LARGE (A) NEGATIVE   RBC / HPF 0-5 0 - 5 RBC/hpf   WBC, UA >50 0 - 5 WBC/hpf   Bacteria, UA MANY (A) NONE SEEN   Squamous Epithelial / HPF 0-5 0 - 5 /HPF   Mucus PRESENT   Glucose, capillary   Collection Time: 11/01/23 10:06 PM  Result Value Ref Range   Glucose-Capillary 111 (H) 70 - 99 mg/dL    Patient Active Problem List   Diagnosis Date Noted   [redacted] weeks gestation of pregnancy 10/24/2023   Opioid use disorder 10/24/2023   Chronic prescription benzodiazepine use 10/24/2023   Panic attacks: start Paxl 20 mg qhs 10/24/2023   Vitamin B 12 deficiency 10/08/2023   Iron  deficiency 10/08/2023   Diabetic retinopathy associated with type 2 diabetes mellitus (HCC) 10/08/2023   Muscle spasm 10/08/2023   Thrombocytopenia 10/07/2023   Pyelonephritis complicating pregnancy in third trimester 10/06/2023   Type 2 diabetes mellitus treated with insulin  (HCC) 08/23/2023   UTI (urinary tract infection) 07/28/2023   Abnormal Pap smear of cervix 05/31/2023   Pregnancy complicated by Suboxone  maintenance, antepartum (HCC) 05/25/2023   Diabetic neuropathy (HCC) 05/25/2023   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   Supervision of high-risk pregnancy 05/17/2023   Chronic cystitis 10/09/2018   History of gestational hypertension 10/20/2015   History of preterm delivery, currently pregnant 10/20/2015   Depression with anxiety 07/16/2015   Fatty liver 09/21/2013   Deficiency anemia 07/15/2013   Microalbuminuric diabetic nephropathy (HCC) 04/17/2013   Hidradenitis 06/05/2012   Bipolar disorder, unspecified (HCC) 03/21/2012   Type 2 diabetes mellitus affecting pregnancy in second trimester, antepartum 03/21/2012   Asthma, chronic 03/21/2012    Hypercholesteremia 01/04/2012   Palpitations 01/04/2012   Bulimia nervosa, purging type (HCC) 11/05/2010   Moderate mood disorder 11/05/2010    Assessment/Plan:  Heather [name] is a 28 y.o. H6E9888 at [redacted]w[redacted]d here for IOL for non-compliant T2 DM, OUD, HSV, recurrent UTIs, and anxiety.   #Labor: Patient felt increased pain and fluid loss when she presented to the MAU, results pending to see if patient has SROM'ed. Due to history, will bring in for induction.  #Pain: Wants an epidural #FWB: CAT I #GBS status:  negative #Feeding: Formula #Reproductive Life planning: IUD PP #Circ:  not applicable  #OUD: Continue Klonapin 1mg  TID, Subutex  8mg  QID, gabapentin  800mg  TID  #Anxiety: Continue paxil 20mg  daily  #Type II DM: Patient takes long-acting insulin  15u every morning and evening, 8u short-acting x3 with meals (non-compliant), and metformin  1000mg  BID. - Hold metformin  during induction and labor - Q2 hour glucose checks  #Recurrent HSV: Has not been taking Valtrex . No lesions noted on exam and she has not had an outbreak in 2 years. Will not do any prophylaxis for labor.   #Recurrent UTI: Continue nitrofurantoin  100mg  nightly  [name], MD  11/01/2023, 10:09 PM  Midwife Attestation:  I personally saw and evaluated the patient, performing the key elements of the service. I  developed and verified the management plan that is described in the resident's/student's note, and I agree with the content with my edits above. VSS, HRR&R, Resp unlabored, Legs neg.    Olam Boards, CNM 5:22 AM

## 2023-11-01 NOTE — MAU Provider Note (Signed)
 Admit to L+D

## 2023-11-02 ENCOUNTER — Inpatient Hospital Stay (HOSPITAL_COMMUNITY): Payer: MEDICAID | Admitting: Anesthesiology

## 2023-11-02 ENCOUNTER — Other Ambulatory Visit: Payer: Self-pay

## 2023-11-02 ENCOUNTER — Encounter (HOSPITAL_COMMUNITY): Payer: Self-pay | Admitting: Obstetrics & Gynecology

## 2023-11-02 DIAGNOSIS — O4202 Full-term premature rupture of membranes, onset of labor within 24 hours of rupture: Secondary | ICD-10-CM

## 2023-11-02 DIAGNOSIS — O24424 Gestational diabetes mellitus in childbirth, insulin controlled: Secondary | ICD-10-CM

## 2023-11-02 DIAGNOSIS — O99324 Drug use complicating childbirth: Secondary | ICD-10-CM

## 2023-11-02 DIAGNOSIS — O99344 Other mental disorders complicating childbirth: Secondary | ICD-10-CM

## 2023-11-02 DIAGNOSIS — O9832 Other infections with a predominantly sexual mode of transmission complicating childbirth: Secondary | ICD-10-CM

## 2023-11-02 DIAGNOSIS — O9912 Other diseases of the blood and blood-forming organs and certain disorders involving the immune mechanism complicating childbirth: Secondary | ICD-10-CM

## 2023-11-02 DIAGNOSIS — Z3A36 36 weeks gestation of pregnancy: Secondary | ICD-10-CM

## 2023-11-02 LAB — CBC
HCT: 33.6 % — ABNORMAL LOW (ref 36.0–46.0)
Hemoglobin: 11.2 g/dL — ABNORMAL LOW (ref 12.0–15.0)
MCH: 28.2 pg (ref 26.0–34.0)
MCHC: 33.3 g/dL (ref 30.0–36.0)
MCV: 84.6 fL (ref 80.0–100.0)
Platelets: 135 K/uL — ABNORMAL LOW (ref 150–400)
RBC: 3.97 MIL/uL (ref 3.87–5.11)
RDW: 14 % (ref 11.5–15.5)
WBC: 7.2 K/uL (ref 4.0–10.5)
nRBC: 0 % (ref 0.0–0.2)

## 2023-11-02 LAB — COMPREHENSIVE METABOLIC PANEL WITH GFR
ALT: 9 U/L (ref 0–44)
AST: 13 U/L — ABNORMAL LOW (ref 15–41)
Albumin: 2.3 g/dL — ABNORMAL LOW (ref 3.5–5.0)
Alkaline Phosphatase: 105 U/L (ref 38–126)
Anion gap: 16 — ABNORMAL HIGH (ref 5–15)
BUN: 5 mg/dL — ABNORMAL LOW (ref 6–20)
CO2: 20 mmol/L — ABNORMAL LOW (ref 22–32)
Calcium: 8 mg/dL — ABNORMAL LOW (ref 8.9–10.3)
Chloride: 102 mmol/L (ref 98–111)
Creatinine, Ser: 0.49 mg/dL (ref 0.44–1.00)
GFR, Estimated: >60 mL/min (ref >60)
Glucose, Bld: 80 mg/dL (ref 70–99)
Potassium: 3.3 mmol/L — ABNORMAL LOW (ref 3.5–5.1)
Sodium: 138 mmol/L (ref 135–145)
Total Bilirubin: 0.4 mg/dL (ref 0.0–1.2)
Total Protein: 5.7 g/dL — ABNORMAL LOW (ref 6.5–8.1)

## 2023-11-02 LAB — GLUCOSE, CAPILLARY
Glucose-Capillary: 102 mg/dL — ABNORMAL HIGH (ref 70–99)
Glucose-Capillary: 86 mg/dL (ref 70–99)
Glucose-Capillary: 82 mg/dL (ref 70–99)
Glucose-Capillary: 83 mg/dL (ref 70–99)

## 2023-11-02 LAB — TYPE AND SCREEN
ABO/RH(D): O POS
Antibody Screen: NEGATIVE

## 2023-11-02 LAB — RPR: RPR Ser Ql: NONREACTIVE

## 2023-11-02 MED ORDER — OXYTOCIN-SODIUM CHLORIDE 30-0.9 UT/500ML-% IV SOLN: 2.5000 [IU]/h | INTRAVENOUS | Status: DC | PRN

## 2023-11-02 MED ORDER — TERBUTALINE SULFATE 1 MG/ML IJ SOLN: 0.2500 mg | Freq: Once | INTRAMUSCULAR | Status: DC | PRN

## 2023-11-02 MED ORDER — PRENATAL MULTIVITAMIN CH
1.0000 | ORAL_TABLET | Freq: Every day | ORAL | Status: DC
Start: 2023-11-03 — End: 2023-11-04
  Administered 2023-11-03 – 2023-11-04 (×2): 1 via ORAL
  Filled 2023-11-02 (×2): qty 1

## 2023-11-02 MED ORDER — SIMETHICONE 80 MG PO CHEW: 80.0000 mg | CHEWABLE_TABLET | ORAL | Status: DC | PRN

## 2023-11-02 MED ORDER — WITCH HAZEL-GLYCERIN EX PADS: 1.0000 | MEDICATED_PAD | CUTANEOUS | Status: DC | PRN

## 2023-11-02 MED ORDER — PHENYLEPHRINE 80 MCG/ML (10ML) SYRINGE FOR IV PUSH (FOR BLOOD PRESSURE SUPPORT)
80.0000 ug | PREFILLED_SYRINGE | INTRAVENOUS | Status: DC | PRN
  Filled 2023-11-02: qty 10

## 2023-11-02 MED ORDER — ONDANSETRON HCL 4 MG PO TABS: 4.0000 mg | ORAL_TABLET | ORAL | Status: DC | PRN

## 2023-11-02 MED ORDER — BUPRENORPHINE HCL 8 MG SL SUBL: 8.0000 mg | SUBLINGUAL_TABLET | Freq: Four times a day (QID) | SUBLINGUAL | Status: DC

## 2023-11-02 MED ORDER — OXYCODONE HCL 5 MG PO TABS: 5.0000 mg | ORAL_TABLET | ORAL | Status: DC | PRN

## 2023-11-02 MED ORDER — ACETAMINOPHEN 500 MG PO TABS
1000.0000 mg | ORAL_TABLET | Freq: Four times a day (QID) | ORAL | Status: DC | PRN
  Administered 2023-11-04: 1000 mg via ORAL
  Filled 2023-11-02: qty 2

## 2023-11-02 MED ORDER — LACTATED RINGERS IV SOLN
500.0000 mL | Freq: Once | INTRAVENOUS | Status: DC
Start: 2023-11-02 — End: 2023-11-02

## 2023-11-02 MED ORDER — DIPHENHYDRAMINE HCL 25 MG PO CAPS: 25.0000 mg | ORAL_CAPSULE | Freq: Four times a day (QID) | ORAL | Status: DC | PRN

## 2023-11-02 MED ORDER — NITROFURANTOIN MONOHYD MACRO 100 MG PO CAPS
100.0000 mg | ORAL_CAPSULE | Freq: Every day | ORAL | Status: DC
  Administered 2023-11-02: 100 mg via ORAL
  Filled 2023-11-02 (×2): qty 1

## 2023-11-02 MED ORDER — MISOPROSTOL 25 MCG QUARTER TABLET
25.0000 ug | ORAL_TABLET | ORAL | Status: DC
  Administered 2023-11-02: 25 ug via VAGINAL
  Filled 2023-11-02: qty 1

## 2023-11-02 MED ORDER — MISOPROSTOL 50MCG HALF TABLET
50.0000 ug | ORAL_TABLET | Freq: Once | ORAL | Status: AC
  Administered 2023-11-02: 50 ug via ORAL
  Filled 2023-11-02: qty 1

## 2023-11-02 MED ORDER — FAMOTIDINE IN NACL 20-0.9 MG/50ML-% IV SOLN
20.0000 mg | Freq: Two times a day (BID) | INTRAVENOUS | Status: DC
  Administered 2023-11-02 (×2): 20 mg via INTRAVENOUS
  Filled 2023-11-02 (×3): qty 50

## 2023-11-02 MED ORDER — COCONUT OIL OIL: 1.0000 | TOPICAL_OIL | Status: DC | PRN

## 2023-11-02 MED ORDER — SODIUM CHLORIDE 0.9 % IV SOLN
5.0000 10*6.[IU] | Freq: Once | INTRAVENOUS | Status: AC
  Administered 2023-11-02: 5 10*6.[IU] via INTRAVENOUS
  Filled 2023-11-02: qty 5

## 2023-11-02 MED ORDER — GABAPENTIN 300 MG PO CAPS
800.0000 mg | ORAL_CAPSULE | Freq: Three times a day (TID) | ORAL | Status: DC
  Administered 2023-11-02 – 2023-11-04 (×8): 800 mg via ORAL
  Filled 2023-11-02 (×9): qty 2

## 2023-11-02 MED ORDER — ZOLPIDEM TARTRATE 5 MG PO TABS: 5.0000 mg | ORAL_TABLET | Freq: Every evening | ORAL | Status: DC | PRN

## 2023-11-02 MED ORDER — BUPRENORPHINE HCL 2 MG SL SUBL: 8.0000 mg | SUBLINGUAL_TABLET | Freq: Four times a day (QID) | SUBLINGUAL | Status: DC

## 2023-11-02 MED ORDER — IBUPROFEN 600 MG PO TABS
600.0000 mg | ORAL_TABLET | Freq: Four times a day (QID) | ORAL | Status: DC
  Administered 2023-11-02 – 2023-11-04 (×7): 600 mg via ORAL
  Filled 2023-11-02 (×8): qty 1

## 2023-11-02 MED ORDER — EPHEDRINE 5 MG/ML INJ: 10.0000 mg | INTRAVENOUS | Status: DC | PRN

## 2023-11-02 MED ORDER — DIPHENHYDRAMINE HCL 50 MG/ML IJ SOLN: 12.5000 mg | INTRAMUSCULAR | Status: DC | PRN

## 2023-11-02 MED ORDER — MISOPROSTOL 25 MCG QUARTER TABLET
25.0000 ug | ORAL_TABLET | Freq: Once | ORAL | Status: AC
  Administered 2023-11-02: 25 ug via VAGINAL
  Filled 2023-11-02: qty 1

## 2023-11-02 MED ORDER — OXYTOCIN-SODIUM CHLORIDE 30-0.9 UT/500ML-% IV SOLN
1.0000 m[IU]/min | INTRAVENOUS | Status: DC
  Administered 2023-11-02: 2 m[IU]/min via INTRAVENOUS
  Filled 2023-11-02: qty 500

## 2023-11-02 MED ORDER — PENICILLIN G POT IN DEXTROSE 60000 UNIT/ML IV SOLN
3.0000 10*6.[IU] | INTRAVENOUS | Status: DC
  Administered 2023-11-02 (×3): 3 10*6.[IU] via INTRAVENOUS
  Filled 2023-11-02 (×6): qty 50

## 2023-11-02 MED ORDER — BUPRENORPHINE HCL 2 MG SL SUBL
8.0000 mg | SUBLINGUAL_TABLET | Freq: Four times a day (QID) | SUBLINGUAL | Status: DC
  Administered 2023-11-02 (×4): 8 mg via SUBLINGUAL
  Filled 2023-11-02 (×4): qty 1

## 2023-11-02 MED ORDER — PAROXETINE HCL 20 MG PO TABS
20.0000 mg | ORAL_TABLET | Freq: Every day | ORAL | Status: DC
Start: 2023-11-02 — End: 2023-11-04
  Administered 2023-11-02 – 2023-11-04 (×2): 20 mg via ORAL
  Filled 2023-11-02 (×3): qty 1

## 2023-11-02 MED ORDER — LIDOCAINE-EPINEPHRINE (PF) 1.5 %-1:200000 IJ SOLN
INTRAMUSCULAR | Status: DC | PRN
  Administered 2023-11-02: 5 mL via EPIDURAL

## 2023-11-02 MED ORDER — CLONAZEPAM 1 MG PO TABS
1.0000 mg | ORAL_TABLET | Freq: Three times a day (TID) | ORAL | Status: DC
  Administered 2023-11-02 – 2023-11-04 (×8): 1 mg via ORAL
  Filled 2023-11-02 (×8): qty 1

## 2023-11-02 MED ORDER — BENZOCAINE-MENTHOL 20-0.5 % EX AERO: 1.0000 | INHALATION_SPRAY | CUTANEOUS | Status: DC | PRN

## 2023-11-02 MED ORDER — CALCIUM CARBONATE ANTACID 500 MG PO CHEW
4.0000 | CHEWABLE_TABLET | Freq: Once | ORAL | Status: AC
  Administered 2023-11-02: 800 mg via ORAL
  Filled 2023-11-02: qty 4

## 2023-11-02 MED ORDER — DIBUCAINE (PERIANAL) 1 % EX OINT: 1.0000 | TOPICAL_OINTMENT | CUTANEOUS | Status: DC | PRN

## 2023-11-02 MED ORDER — FENTANYL-BUPIVACAINE-NACL 0.5-0.125-0.9 MG/250ML-% EP SOLN
12.0000 mL/h | EPIDURAL | Status: DC | PRN
  Administered 2023-11-02: 12 mL/h via EPIDURAL
  Filled 2023-11-02: qty 250

## 2023-11-02 MED ORDER — ACETAMINOPHEN 325 MG PO TABS: 650.0000 mg | ORAL_TABLET | ORAL | Status: DC | PRN

## 2023-11-02 MED ORDER — BUPRENORPHINE HCL 2 MG SL SUBL
8.0000 mg | SUBLINGUAL_TABLET | Freq: Four times a day (QID) | SUBLINGUAL | Status: DC
  Administered 2023-11-02 – 2023-11-04 (×6): 8 mg via SUBLINGUAL
  Filled 2023-11-02 (×6): qty 4

## 2023-11-02 MED ORDER — SENNOSIDES-DOCUSATE SODIUM 8.6-50 MG PO TABS
2.0000 | ORAL_TABLET | ORAL | Status: DC
Start: 2023-11-03 — End: 2023-11-04
  Administered 2023-11-03 – 2023-11-04 (×2): 2 via ORAL
  Filled 2023-11-02 (×2): qty 2

## 2023-11-02 MED ORDER — ONDANSETRON HCL 4 MG/2ML IJ SOLN: 4.0000 mg | INTRAMUSCULAR | Status: DC | PRN

## 2023-11-02 MED ORDER — MEASLES, MUMPS & RUBELLA VAC ~~LOC~~ SUSR: 0.5000 mL | Freq: Once | SUBCUTANEOUS | Status: DC

## 2023-11-02 MED ORDER — TETANUS-DIPHTH-ACELL PERTUSSIS 5-2-15.5 LF-MCG/0.5 IM SUSP: 0.5000 mL | Freq: Once | INTRAMUSCULAR | Status: DC

## 2023-11-02 MED ORDER — METFORMIN HCL ER 500 MG PO TB24
1000.0000 mg | ORAL_TABLET | Freq: Two times a day (BID) | ORAL | Status: DC
  Administered 2023-11-03 – 2023-11-04 (×3): 1000 mg via ORAL
  Filled 2023-11-02 (×3): qty 2

## 2023-11-02 MED ORDER — PHENYLEPHRINE 80 MCG/ML (10ML) SYRINGE FOR IV PUSH (FOR BLOOD PRESSURE SUPPORT): 80.0000 ug | PREFILLED_SYRINGE | INTRAVENOUS | Status: DC | PRN

## 2023-11-02 NOTE — Inpatient Diabetes Management (Signed)
 ADA Standards of Care 2025 Diabetes in Pregnancy Target Glucose Ranges:  Fasting: 70 - 95 mg/dL 1 hr postprandial:  889 - 140mg /dL (from first bite of meal) 2 hr postprandial:  100 - 120 mg/dL (from first bit of meal)    DM History: DM 2 (on Metformin  prior to pregnancy)  Home meds:  Novolog  8 units tid with meals NPH 15 units bid  Hospital meds:  CBG's ordered q 4 hours  Recommendations:  If CBG>120 mg/dL x2, consider IV Insulin /EndoTool.   Thanks,  Randall Bullocks, RN, BC-ADM Inpatient Diabetes Coordinator Pager [phone]  (8a-5p)

## 2023-11-02 NOTE — Progress Notes (Signed)
 28 yo g3p0111 @ 36+6, iol for uncontrolled t2dm, also OUD on suboxone , now 3 dm and cyotec given by rn, cat 1 tracing, will plan on starting pitocin  in 4 hours, arm when safe to do so. Glucose well controlled currently.

## 2023-11-02 NOTE — Progress Notes (Signed)
 28 yo g3p0111 @ 36+6, iol for uncontrolled t2dm, also OUD on suboxone , now s/p cytotec  and epidural, cat 1 tracing, 4 cm and vertex well applied, after verbal informed consent including discussion of risk of cord prolapse arom performed, scant clear fluid, cat 1 tracing after, will start pitocin .

## 2023-11-02 NOTE — Progress Notes (Addendum)
 Labor Progress Note Elandra Powell is a 28 y.o. H6E9888 at [redacted]w[redacted]d presented for IOL. S:  Patient sitting up comfortably in bed, going through baby outfits. Not in much pain.   O:  BP 123/62   Pulse (!) 57   Temp 98.7 F (37.1 C) (Oral)   Resp 14   Ht 5' 5 (1.651 m)   Wt 95.3 kg   LMP  (LMP Unknown)   SpO2 97%   BMI 34.95 kg/m  EFM: 120/mod variability/- accels, - decels  CVE: Dilation: Fingertip Presentation: Vertex Exam by:: Lum Sharps RN   A&P: 28 y.o. H6E9888 [redacted]w[redacted]d here for IOL.  #Labor: Patient has not progressed since admission and her cervix remains 0.5cm dilated, will place a foley balloon on next visit to the room.  #Pain: Will maybe use epidural, unsure #FWB: CAT I #GBS negative  [name], MD 6:54 AM

## 2023-11-02 NOTE — Anesthesia Procedure Notes (Signed)
 Epidural Patient location during procedure: OB Start time: 11/02/2023 8:53 AM End time: 11/02/2023 9:03 AM  Staffing Anesthesiologist: Dorethea Cordella SQUIBB, DO Performed: anesthesiologist   Preanesthetic Checklist Completed: patient identified, IV checked, site marked, risks and benefits discussed, surgical consent, monitors and equipment checked, pre-op evaluation and timeout performed  Epidural Patient position: sitting Prep: ChloraPrep Patient monitoring: heart rate, continuous pulse ox and blood pressure Approach: midline Location: L3-L4 Injection technique: LOR saline  Needle:  Needle type: Tuohy  Needle gauge: 17 G Needle length: 9 cm Needle insertion depth: 8 cm Catheter type: closed end flexible Catheter size: 20 Guage Catheter at skin depth: 14 cm Test dose: negative and 1.5% lidocaine  with Epi 1:200 K  Assessment Events: blood not aspirated, no cerebrospinal fluid, injection not painful, no injection resistance and no paresthesia  Additional Notes Patient identified. Risks/Benefits/Options discussed with patient including but not limited to bleeding, infection, nerve damage, paralysis, failed block, incomplete pain control, headache, blood pressure changes, nausea, vomiting, reactions to medications, itching and postpartum back pain. Confirmed with bedside nurse the patient's most recent platelet count. Confirmed with patient that they are not currently taking any anticoagulation, have any bleeding history or any family history of bleeding disorders. Patient expressed understanding and wished to proceed. All questions were answered. Sterile technique was used throughout the entire procedure. Please see nursing notes for vital signs. Test dose was given through epidural catheter and negative prior to continuing to dose epidural or start infusion. Warning signs of high block given to the patient including shortness of breath, tingling/numbness in hands, complete motor block,  or any concerning symptoms with instructions to call for help. Patient was given instructions on fall risk and not to get out of bed. All questions and concerns addressed with instructions to call with any issues or inadequate analgesia.    Reason for block:procedure for pain

## 2023-11-02 NOTE — Discharge Summary (Shared)
 Postpartum Discharge Summary  Date of Service updated***     Patient Name: Tysha Grismore DOB: 07/15/1995 MRN: [id number]  Date of admission: 11/01/2023 Delivery date:11/02/2023 Delivering provider:   Date of discharge: 11/02/2023  Admitting diagnosis: Intrauterine normal pregnancy [Z34.90] Intrauterine pregnancy: [redacted]w[redacted]d     Secondary diagnosis:  Principal Problem:   Intrauterine normal pregnancy Active Problems:   Bipolar disorder, unspecified (HCC)   Type 2 diabetes mellitus affecting pregnancy in second trimester, antepartum   HSV-2 infection   Opioid use disorder  Additional problems: ***    Discharge diagnosis: Preterm Pregnancy Delivered                                              Post partum procedures:{Postpartum procedures:23558} Augmentation: AROM, Pitocin , and Cytotec  Complications: None  Hospital course: Induction of Labor With Vaginal Delivery   28 y.o. yo H6E9888 at [redacted]w[redacted]d was admitted to the hospital 11/01/2023 for induction of labor.  Indication for induction: uncontrolled type 2 diabetes.  Patient had an labor course complicated by nothing. Membrane Rupture Time/Date: 2:33 PM,11/02/2023  Delivery Method: vaginal Operative Delivery:N/A Episiotomy:  n/a Lacerations:   none Details of delivery can be found in separate delivery note.  Patient had a postpartum course complicated by***. Patient is discharged home 11/02/23.  Newborn Data: Birth date:11/02/2023 Birth time:4:35 PM Gender:Female Living status:  Apgars: ,  Weight:   Magnesium  Sulfate received: No BMZ received: No Rhophylac:N/A MMR:{MMR:30440033} T-DaP:{Tdap:23962} Flu: {Qol:76036} RSV Vaccine received: {RSV:31013} Transfusion:{Transfusion received:30440034}  Immunizations received: Immunization History  Administered Date(s) Administered   DTaP 08/15/1996, 10/16/1996, 12/19/1996, 02/13/1998, 07/18/2001   Fluzone Influenza virus vaccine,trivalent (IIV3), split virus 11/04/2010    H1N1 11/09/2007   HIB (PRP-OMP) 08/15/1996, 10/16/1996, 02/13/1998   HPV Quadrivalent 11/03/2010, 05/17/2013   Hepatitis A, Ped/Adol-2 Dose 05/17/2013   Hepatitis B 28-May-1996, 08/15/1996, 12/19/1996   IPV 08/15/1996, 10/16/1996, 02/13/1998, 07/18/2001   Influenza Inj Mdck Quad Pf 10/14/2011   Influenza Whole 11/03/2005, 11/15/2006, 11/09/2007, 09/13/2008, 09/12/2009, 11/03/2010   Influenza, Seasonal, Injecte, Preservative Fre 10/14/2011   Influenza,inj,Quad PF,6+ Mos 10/16/2013   Influenza,inj,quad, With Preservative 12/30/2020   Influenza-Unspecified 11/04/2010, 10/23/2012   MMR 07/15/1997, 07/18/2001, 01/02/2021   Meningococcal Conjugate 09/13/2008   PPD Test 12/30/2020   Pneumococcal Conjugate-13 07/31/1999   Td 08/22/2007   Tdap 08/22/2007, 06/27/2014, 12/30/2020   Varicella 07/15/1997, 09/13/2008    Physical exam  Vitals:   11/02/23 1400 11/02/23 1430 11/02/23 1500 11/02/23 1535  BP: (!) 109/59 (!) 119/52 127/62 (!) 128/46  Pulse: 80 96 69 75  Resp:      Temp:    98.2 F (36.8 C)  TempSrc:    Oral  SpO2:      Weight:      Height:       General: {Exam; general:21111117} Lochia: {Desc; appropriate/inappropriate:30686::appropriate} Uterine Fundus: {Desc; firm/soft:30687} Incision: {Exam; incision:21111123} DVT Evaluation: {Exam; icu:7888877} Labs: Lab Results  Component Value Date   WBC 7.2 11/02/2023   HGB 11.2 (L) 11/02/2023   HCT 33.6 (L) 11/02/2023   MCV 84.6 11/02/2023   PLT 135 (L) 11/02/2023      Latest Ref Rng & Units 11/02/2023   12:03 AM  CMP  Glucose 70 - 99 mg/dL 80   BUN 6 - 20 mg/dL 5   Creatinine 9.55 - 8.99 mg/dL 9.50   Sodium 864 - 854 mmol/L 138  Potassium 3.5 - 5.1 mmol/L 3.3   Chloride 98 - 111 mmol/L 102   CO2 22 - 32 mmol/L 20   Calcium  8.9 - 10.3 mg/dL 8.0   Total Protein 6.5 - 8.1 g/dL 5.7   Total Bilirubin 0.0 - 1.2 mg/dL 0.4   Alkaline Phos 38 - 126 U/L 105   AST 15 - 41 U/L 13   ALT 0 - 44 U/L 9    Edinburgh Score:      No data to display         No data recorded  After visit meds:  Allergies as of 11/02/2023       Reactions   Prozac [fluoxetine Hcl]    Stiffness in neck, stroke like symptoms   Cephalosporins Other (See Comments)   Unknown childhood reaction; states took last month at the hospital with benadryl  and was okay.   Latuda [lurasidone Hcl]    Tongue swelling, confusion      Med Rec must be completed prior to using this [facility]***        Discharge home in stable condition Infant Feeding: {Baby feeding:23562} Infant Disposition:{CHL IP OB HOME WITH FNUYZM:76418} Discharge instruction: per After Visit Summary and Postpartum booklet. Activity: Advance as tolerated. Pelvic rest for 6 weeks.  Diet: {OB ipzu:78888878} Future Appointments:No future appointments. Follow up Visit:   Please schedule this patient for a In person postpartum visit in 4 weeks with the following provider: MD. Additional Postpartum F/U:{PP Procedure:23957}  High risk pregnancy complicated by: t2dm, opioid use disorder Delivery mode:   vaginal Anticipated Birth Control:  interval iud   11/02/2023 Devaughn KATHEE Ban, MD

## 2023-11-02 NOTE — Progress Notes (Signed)
 Just talked to 1st call L&D Worley, MD) re: patient would like oxycodone  orders discontinued.  She does not want them to be available to her.  She states she is trying to stay clean.  I talked to patient about her available pain medications (tylenol , gabapentin , ibuprofen ) to make sure she knows her available options that are not an opioid.  Called Trudy to talk to her about patient request, she increased her tylenol  dose to 1000mg  PO q6hrs.  Also talked to her about the suboxone  schedule, there is a 12 hour gap, she changed it to [phone].  After talking to dr Trudy, pharmacy secure chatted and asked us  to ask the patient when she typically takes her suboxone .  The patient stated 8a, 12p, 1600, 2200.  Told pharmacy.

## 2023-11-02 NOTE — Anesthesia Preprocedure Evaluation (Addendum)
 Anesthesia Evaluation  Patient identified by MRN, date of birth, ID band Patient awake    Reviewed: Allergy & Precautions, NPO status , Patient's Chart, lab work & pertinent test results  Airway Mallampati: II  TM Distance: >3 FB Neck ROM: Full    Dental no notable dental hx.    Pulmonary asthma , sleep apnea    Pulmonary exam normal        Cardiovascular hypertension,  Rhythm:Regular Rate:Normal     Neuro/Psych Seizures -, Well Controlled,   Anxiety Depression Bipolar Disorder Schizophrenia     GI/Hepatic Neg liver ROS, hiatal hernia,GERD  Medicated,,  Endo/Other  diabetes, Type 2    Renal/GU   negative genitourinary   Musculoskeletal   Abdominal Normal abdominal exam  (+)   Peds  Hematology  (+) Blood dyscrasia, anemia   Anesthesia Other Findings   Reproductive/Obstetrics                              Anesthesia Physical Anesthesia Plan  ASA: 3  Anesthesia Plan: Epidural   Post-op Pain Management:    Induction:   PONV Risk Score and Plan: 2 and Treatment may vary due to age or medical condition  Airway Management Planned: Natural Airway  Additional Equipment: None  Intra-op Plan:   Post-operative Plan:   Informed Consent: I have reviewed the patients History and Physical, chart, labs and discussed the procedure including the risks, benefits and alternatives for the proposed anesthesia with the patient or authorized representative who has indicated his/her understanding and acceptance.     Dental advisory given  Plan Discussed with:   Anesthesia Plan Comments:         Anesthesia Quick Evaluation

## 2023-11-02 NOTE — Lactation Note (Signed)
 This note was copied from a baby's chart. Lactation Consultation Note  Patient Name: Heather [name] Today's Date: 11/02/2023 Age:27 hours Reason for consult: Initial assessment;Mother's request;1st time breastfeeding;Late-preterm 34-36.6wks;Maternal endocrine disorder;Breastfeeding assistance (subutex  use and hx HSV)  P2- Infant was born at 36w 6d GA weighing 3210g. MOB plans to offer both breast milk and formula. MOB has a hx of polysubstance abuse (testing positive 08/01/23) with cocaine, amphetamines and benzos. MOB reported wanted to really breastfeed for the first 6 months of life to help with withdrawal symptoms. MOB became tearful when talking about infant and not wanting to do anything to hurt her. Infant had just had a bottle feeding (10 mL) right before LC's consult. Infant was sleepy, but LC offered to assist with latching if MOB wanted to. Infant was placed on the left breast in the football hold. Infant was asleep and did not want to nurse. With permission, LC hand expressed MOB's rolling colostrum into infant's mouth. Infant was receptive to this. MOB was excited that infant was able to get a little of her colostrum.   LC reviewed the first 24 hr birthday nap, day 2 cluster feeding, feeding infant on cue 8-12x in 24 hrs, not allowing infant to go over 3 hrs without a feeding, CDC milk storage guidelines, LC services handout and engorgement/breast care. LC provided MOB with a manual pump and reviewed how to order a DEBP through insurance. LC encouraged MOB to call for further assistance as needed.  Maternal Data Has patient been taught Hand Expression?: Yes Does the patient have breastfeeding experience prior to this delivery?: No  Feeding Mother's Current Feeding Choice: Breast Milk and Formula Nipple Type: Slow - flow  LATCH Score Latch: Too sleepy or reluctant, no latch achieved, no sucking elicited.  Audible Swallowing: None  Type of Nipple: Everted at rest and after  stimulation  Comfort (Breast/Nipple): Soft / non-tender  Hold (Positioning): Full assist, staff holds infant at breast  LATCH Score: 4   Lactation Tools Discussed/Used Tools: Pump Breast pump type: Manual Pump Education: Setup, frequency, and cleaning;Milk Storage Reason for Pumping: no home pump Pumping frequency: 15-20 min every 3 hrs as needed  Interventions Interventions: Breast feeding basics reviewed;Assisted with latch;Hand express;Breast compression;Adjust position;Support pillows;Position options;Hand pump;Education;LC Services brochure  Discharge Discharge Education: Engorgement and breast care;Warning signs for feeding baby Pump: Manual;Advised to call insurance company  Consult Status Consult Status: Follow-up Date: 11/03/23 Follow-up type: In-patient    Recardo Hoit BS, IBCLC 11/02/2023, 10:13 PM

## 2023-11-03 ENCOUNTER — Other Ambulatory Visit: Payer: MEDICAID

## 2023-11-03 LAB — GLUCOSE, CAPILLARY
Glucose-Capillary: 104 mg/dL — ABNORMAL HIGH (ref 70–99)
Glucose-Capillary: 105 mg/dL — ABNORMAL HIGH (ref 70–99)
Glucose-Capillary: 88 mg/dL (ref 70–99)
Glucose-Capillary: 93 mg/dL (ref 70–99)

## 2023-11-03 NOTE — Progress Notes (Signed)
 Attempted to collect cbg, patient had eaten breafast.

## 2023-11-03 NOTE — Anesthesia Postprocedure Evaluation (Signed)
 Anesthesia Post Note  Patient: Heather [name]  Procedure(s) Performed: AN AD HOC LABOR EPIDURAL     Patient location during evaluation: Mother Baby Anesthesia Type: Epidural Level of consciousness: awake Pain management: satisfactory to patient Vital Signs Assessment: post-procedure vital signs reviewed and stable Respiratory status: spontaneous breathing Cardiovascular status: stable Anesthetic complications: no   No notable events documented.  Last Vitals:  Vitals:   11/03/23 0247 11/03/23 0557  BP: 122/69 (!) 124/58  Pulse: 86 68  Resp: 18 18  Temp: 36.7 C 36.7 C  SpO2: 99% 97%    Last Pain:  Vitals:   11/03/23 0758  TempSrc:   PainSc: 0-No pain   Pain Goal:                Epidural/Spinal Function Cutaneous sensation: Normal sensation (11/03/23 0758), Patient able to flex knees: Yes (11/03/23 0758), Patient able to lift hips off bed: Yes (11/03/23 0758), Back pain beyond tenderness at insertion site: No (11/03/23 0758), Progressively worsening motor and/or sensory loss: No (11/03/23 0758), Bowel and/or bladder incontinence post epidural: No (11/03/23 0758)  JEANENNE HANDING

## 2023-11-03 NOTE — Progress Notes (Addendum)
 POSTPARTUM PROGRESS NOTE  Post Partum Day 1  Subjective:  Heather [name] is a 28 y.o. H6E9787 s/p SVD at [redacted]w[redacted]d.  She reports she is doing well. No acute events overnight. She denies any problems with ambulating, voiding or po intake. Denies nausea or vomiting.  Pain is well controlled.  Lochia is Normal.  Objective: Blood pressure 122/69, pulse 86, temperature 98.1 F (36.7 C), temperature source Oral, resp. rate 18, height 5' 5 (1.651 m), weight 95.3 kg, SpO2 99%, unknown if currently breastfeeding.  BP Readings from Last 3 Encounters:  11/03/23 122/69  10/31/23 124/75  10/24/23 137/82    Physical Exam:  General: alert, cooperative and no distress Chest: no respiratory distress Heart:regular rate Uterine Fundus: firm, appropriately tender Extremities: no edema Skin: warm, dry  Recent Labs    11/02/23 0003  HGB 11.2*  HCT 33.6*    Assessment/Plan: Heather [name] is a 28 y.o. H6E9787 s/p NSVD at [redacted]w[redacted]d   PPD# 1 - Doing well  Routine postpartum care  Delivery Complications: none and gestational diabetes class A 2. Has not required insulin  or metformin  since delivery, and her glucose readings have remained in the normal or minimally elevated range.  Blood Pressure: normal Anemia/Hb Status: Stable, appropriate postpartum Hb, no intervention indicated Contraception: Outpatient IUD Feeding: breast feeding and bottle feeding   Dispo: Plan for discharge tomorrow. Infant staying for NAS monitoring and treatment   LOS: 2 days   [name], MD OB Fellow  11/03/2023, 5:56 AM   GME ATTESTATION:  Evaluation and management procedures were performed by the [facility] Medicine Resident under my supervision. I was immediately available for direct supervision, assistance and direction throughout this encounter.  I also confirm that I have verified the information documented in the resident's note, and that I have also personally reperformed the pertinent components of the  physical exam and all of the medical decision making activities.  I have also made any necessary editorial changes.  Leeroy KATHEE Pouch, MD OB Fellow, Faculty Practice [facility], Center for [facility] 11/03/2023 9:06 AM   .

## 2023-11-03 NOTE — Lactation Note (Signed)
 This note was copied from a baby's chart. Lactation Consultation Note  Patient Name: Heather [name] Today's Date: 11/03/2023 Age:27 hours Reason for consult: Follow-up assessment;1st time breastfeeding;Late-preterm 34-36.6wks;Maternal endocrine disorder;Breastfeeding assistance;RN request  P2- RN requested assistance with feeding infant due to a low glucose reading. As the RN was attempting to bottle feed formula to infant, LC assisted MOB with hand expressing. LC was able to collect 7 mL of EBM. Infant would only take 2 mL of formula for the RN, so this LC took over the feeding. Infant was not interested in the bottle. Infant was limpish and would not suck. LC finger fed the entire 7 mL of EBM to infant. Infant tolerated feeding well. Infant did not suck my finger. LC had to rub infant's chest to get her to swallow the EBM. Infant did not spit up after feeding.  During this hour consult, MOB openly discussed her life with this LC. MOB informed LC that her and her husband have been married for 10 years. Their first child (94 years old) is biologically his, but this new infant is not. MOB reports that FOB was in jail when she became pregnant. MOB reports that when she told the biological FOB about the pregnancy, he said I hope you both die. MOB reports that her husband (the one present in the room) wants this new infant to be his and plans to sign the birth certificate. MOB also informed this LC that she started using fentanyl  about a year ago and this was her drug of choice. MOB reports that she found out she was pregnant at [redacted] weeks gestation and she immediately went to the doctor to get onto Subutex . MOB admitted to this [facility] that once during the pregnancy, she missed her Subutex  pickup and got violently ill so she went out and did as many drugs as possible. MOB reports this was a one time incident. MOB told LC that she wants to breastfeed to help infant with her withdrawal symptoms, but to also  help deter MOB from drugs. LC thanked MOB for being honest with me. LC reviewed how if she decides to actively start using drugs again, she can absolutely not breastfeed anymore. MOB verbalized understanding and agreement. LC encouraged MOB to call for further assistance as needed. LC left infant STS on MOB's chest. LC informed RN and Consulting Civil Engineer of this consult.  Maternal Data Has patient been taught Hand Expression?: Yes Does the patient have breastfeeding experience prior to this delivery?: No  Feeding Mother's Current Feeding Choice: Breast Milk and Formula Nipple Type: Slow - flow  Lactation Tools Discussed/Used Pump Education: Milk Storage  Interventions Interventions: Breast feeding basics reviewed;Hand express;Expressed milk;Education;LC Services brochure;Pace feeding  Discharge Discharge Education: Engorgement and breast care;Warning signs for feeding baby Pump: Manual;Advised to call insurance company  Consult Status Consult Status: Follow-up Date: 11/03/23 Follow-up type: In-patient    Recardo Hoit BS, IBCLC 11/03/2023, 1:27 AM

## 2023-11-03 NOTE — Clinical Social Work Maternal (Signed)
 CLINICAL SOCIAL WORK MATERNAL/CHILD NOTE  Patient Details  Name: Heather [name] MRN: [id number] Date of Birth: 1995-12-15  Date:  11/03/2023  Clinical Social Worker Initiating Note:  Vernal Rutan Date/Time: Initiated:  11/03/23/1451     Child's Name:  Heather [name] 11/02/2023   Biological Parents:  Mother, Father Heather [name] 08-04-1996, Heather [name] 12/28/1991)   Need for Interpreter:  None   Reason for Referral:  Current Substance Use/Substance Use During Pregnancy     Address:  [address] [place] [address]    Phone number:  [phone] (home)     Additional phone number:   Household Members/Support Persons (HM/SP):   Household Member/Support Person 1, Household Member/Support Person 2   HM/SP Name Relationship DOB or Age  HM/SP -1 Ronnie Falkner spouse 12/28/1991  HM/SP -2 Genesis daughter 8  HM/SP -3        HM/SP -4        HM/SP -5        HM/SP -6        HM/SP -7        HM/SP -8          Natural Supports (not living in the home):      Professional Supports: None   Employment: Unemployed   Type of Work:     Education:  Some Materials Engineer arranged:    Surveyor, Quantity Resources:  Medicaid   Other Resources:  Sales Executive  , WIC   Cultural/Religious Considerations Which May Impact Care:    Strengths:  Ability to meet basic needs  , Home prepared for child  , Psychotropic Medications   Psychotropic Medications:  Klonopin , Subutex       Pediatrician:       Pediatrician List:   Radiographer, Therapeutic    [place]      Pediatrician Fax Number:    Risk Factors/Current Problems:  None   Cognitive State:  Alert  , Able to Concentrate     Mood/Affect:  Calm  , Comfortable     CSW Assessment: CSW received a consult for Hx of anxiety depression, bipolar and schizophrenia as well as a hx of substance use. CSW met with MOB to complete an assessment and offer support. CSW  entered the room and observed MOB resting in bed and the infant in the bassinet. CSW introduced self, CSW role and reason for visit. Mob was agreeable to visit. CSW inquired about how MOB was feeling, MOB reported good. CSW confirmed MOB demographic information, MOB verified the information on file was correct. CSW inquired about noted MH hx, MOB reported she was diagnosed as a young age, MOB reported a possible misdiagnosis due to her being diagnosed at such a young age. MOB reported she was prescribed Benzos at age 28 and she felt she was too young to be taking that type of medication, MOB reported no current concerns with her mental health. CSW inquired about treatment, MOB reported MOB is currently taking  Klonopin . CSW assessed for safety, MOB denied any  SI or HI. CSW provided education regarding the baby blues period vs. perinatal mood disorders, discussed treatment and gave resources for mental health follow up if concerns arise.  CSW recommends self-evaluation during the postpartum time period using the New Mom Checklist from Postpartum Progress and encouraged MOB to contact a medical professional if symptoms are noted at any time. MOB identified  FOB as her primary support.   CSW inquired about how MOB substance use, MOB reported she started Subutex  treatment in November 2024, MOB reported she was doing great and in July she was not able to get her prescription and she was sick due to withdrawals that she used cocaine just to feel better. MOB reported she has been back on her Subutex  and has been doing great ever since, MOB reported she plans to follow up with the [facility] postpartum. CSW congratulated MOB on her journey and encouraged her to continue treatment. CSW explained the hospital drug screen policy, MOB verbalized understanding.     CSW provided review of Sudden Infant Death Syndrome (SIDS) precautions.  Mob identified [facility] in [place] for infants follow up care. MOB reported she has  all essential items for the infant including a bassinet, crib and car seat.  CSW identifies no further need for intervention and no barriers to discharge at this time.  CSW Plan/Description:  No Further Intervention Required/No Barriers to Discharge, Sudden Infant Death Syndrome (SIDS) Education, Perinatal Mood and Anxiety Disorder (PMADs) Education, CSW Will Continue to Monitor Umbilical Cord Tissue Drug Screen Results and Make Report if [facility] Drug Screen Policy Information    Eliazar CHRISTELLA Gave, [place] 11/03/2023, 2:57 PM

## 2023-11-04 LAB — CULTURE, BETA STREP (GROUP B ONLY): Strep Gp B Culture: NEGATIVE

## 2023-11-04 MED ORDER — ACETAMINOPHEN 500 MG PO TABS: 1000.0000 mg | ORAL_TABLET | Freq: Four times a day (QID) | ORAL | Status: AC | PRN

## 2023-11-04 MED ORDER — IBUPROFEN 600 MG PO TABS: 600.0000 mg | ORAL_TABLET | Freq: Four times a day (QID) | ORAL | Status: DC

## 2023-11-04 MED ORDER — OXYCODONE HCL 5 MG PO TABS
5.0000 mg | ORAL_TABLET | ORAL | Status: DC | PRN
  Administered 2023-11-04 (×2): 10 mg via ORAL
  Filled 2023-11-04 (×2): qty 2

## 2023-11-04 MED ORDER — POLYETHYLENE GLYCOL 3350 17 GM/SCOOP PO POWD: 17.0000 g | Freq: Every day | ORAL | 0 refills | Status: AC

## 2023-11-04 NOTE — Patient Instructions (Signed)

## 2023-11-04 NOTE — Progress Notes (Signed)
 Per pt, she has already eaten breakfast. Administered her morning metformin  dose and asked her to call out and let us  know when her lunch tray arrives so that we can obtain POCT glucose. Pt expressed understanding.

## 2023-11-04 NOTE — Progress Notes (Signed)
 Previously (10/29) was requested by RN to take off oxycodone  from medications orders as an option (see RN note from that date). Now RN is calling as patient is reporting 9 out of 10 pain level and has taken tylenol , ibuprofen , gabapentin , subutex  and requesting oxycodone  to be added for pain control. She is aware that she previously requested it to be removed as an option to take, but is ok with taking it now.  Oxycodone  ordered. I recommended that RN trial heating pad first for pain as well.  Leeroy KATHEE Pouch, MD FMOB Fellow, Faculty practice [facility], Center for [facility] 11/04/2023, 1:43 AM

## 2023-11-07 ENCOUNTER — Encounter: Payer: Self-pay | Admitting: *Deleted

## 2023-11-07 ENCOUNTER — Inpatient Hospital Stay (HOSPITAL_COMMUNITY): Admission: RE | Admit: 2023-11-07 | Payer: MEDICAID | Source: Home / Self Care | Admitting: Family Medicine

## 2023-11-07 ENCOUNTER — Other Ambulatory Visit: Payer: MEDICAID

## 2023-11-07 ENCOUNTER — Inpatient Hospital Stay (HOSPITAL_COMMUNITY): Payer: MEDICAID

## 2023-11-07 ENCOUNTER — Encounter: Payer: MEDICAID | Admitting: Obstetrics & Gynecology

## 2023-11-10 ENCOUNTER — Other Ambulatory Visit: Payer: MEDICAID

## 2023-11-14 ENCOUNTER — Encounter: Payer: MEDICAID | Admitting: Obstetrics & Gynecology

## 2023-11-14 ENCOUNTER — Ambulatory Visit (INDEPENDENT_AMBULATORY_CARE_PROVIDER_SITE_OTHER): Payer: MEDICAID | Admitting: Obstetrics & Gynecology

## 2023-11-14 ENCOUNTER — Other Ambulatory Visit: Payer: MEDICAID

## 2023-11-14 DIAGNOSIS — F1190 Opioid use, unspecified, uncomplicated: Secondary | ICD-10-CM

## 2023-11-14 DIAGNOSIS — F418 Other specified anxiety disorders: Secondary | ICD-10-CM

## 2023-11-14 DIAGNOSIS — Z760 Encounter for issue of repeat prescription: Secondary | ICD-10-CM

## 2023-11-14 MED ORDER — BUPRENORPHINE HCL 8 MG SL SUBL: SUBLINGUAL_TABLET | SUBLINGUAL | 0 refills | Status: DC

## 2023-11-14 MED ORDER — CLONAZEPAM 1 MG PO TABS: 1.0000 mg | ORAL_TABLET | Freq: Three times a day (TID) | ORAL | 0 refills | Status: DC

## 2023-11-14 NOTE — Progress Notes (Signed)
 Meds will need to be refilled for 2 months postpartum  Meds ordered this encounter  Medications   buprenorphine  (SUBUTEX ) 8 MG SUBL SL tablet    Sig: Take 1 under the tongue 4 times daily    Dispense:  56 tablet    Refill:  0   clonazePAM  (KLONOPIN ) 1 MG tablet    Sig: Take 1 tablet (1 mg total) by mouth 3 (three) times daily for 14 days.    Dispense:  42 tablet    Refill:  0   Pt is doing well No other problems Baby is doing well alsoTHEORA Vonn VEAR Jayne, MD 11/14/2023 11:16 AM

## 2023-11-17 ENCOUNTER — Other Ambulatory Visit: Payer: MEDICAID

## 2023-11-21 ENCOUNTER — Encounter: Payer: MEDICAID | Admitting: Obstetrics & Gynecology

## 2023-11-21 ENCOUNTER — Other Ambulatory Visit: Payer: MEDICAID

## 2023-11-24 ENCOUNTER — Other Ambulatory Visit: Payer: MEDICAID

## 2023-11-28 ENCOUNTER — Encounter: Payer: Self-pay | Admitting: Obstetrics & Gynecology

## 2023-11-28 ENCOUNTER — Other Ambulatory Visit: Payer: Self-pay | Admitting: Obstetrics & Gynecology

## 2023-11-28 ENCOUNTER — Ambulatory Visit (INDEPENDENT_AMBULATORY_CARE_PROVIDER_SITE_OTHER): Payer: MEDICAID | Admitting: Obstetrics & Gynecology

## 2023-11-28 DIAGNOSIS — F1190 Opioid use, unspecified, uncomplicated: Secondary | ICD-10-CM

## 2023-11-28 DIAGNOSIS — F418 Other specified anxiety disorders: Secondary | ICD-10-CM

## 2023-11-28 MED ORDER — BUPRENORPHINE HCL 8 MG SL SUBL: SUBLINGUAL_TABLET | SUBLINGUAL | 0 refills | Status: DC

## 2023-11-28 MED ORDER — CLONAZEPAM 1 MG PO TABS: 1.0000 mg | ORAL_TABLET | Freq: Three times a day (TID) | ORAL | 0 refills | Status: DC

## 2023-11-28 NOTE — Progress Notes (Signed)
 GYN VISIT Patient name: Jennavecia Schwier MRN [id number]  Date of birth: [birth year] Chief Complaint:   Postpartum Care  History of Present Illness:   Krystiana Fornes is a 28 y.o. G3P0212 female being seen today for OUD followup  Status post delivery 10/29 Today she notes that things have been going well.  The medication is still working well for her and wishes to continue.  Denies fevers or chills.  Lochia has significantly improved.  Briefly discussed contraceptive options and will likely plan for IUD at next 6-week postpartum visit.     No LMP recorded.    Review of Systems:   Pertinent items are noted in HPI Denies fever/chills, dizziness, headaches, visual disturbances, fatigue, shortness of breath, chest pain, abdominal pain, vomiting. Pertinent History Reviewed:   Past Surgical History:  Procedure Laterality Date   ADENOIDECTOMY     ANTERIOR CRUCIATE LIGAMENT REPAIR     INCISION AND DRAINAGE ABSCESS     Recurrent abscesses of labia and groin area with I and D in office and ER   TONSILLECTOMY     WISDOM TOOTH EXTRACTION      Past Medical History:  Diagnosis Date   ADHD (attention deficit hyperactivity disorder)    Anemia    Anxiety    Asthma    as a child   Bipolar 1 disorder (HCC)    Depression    Diabetes mellitus    type 2   Diabetic neuropathy (HCC)    Fatty liver    GHTN vs. CHTN 06/11/2015   Hidradenitis 06/05/2012   History of hiatal hernia    Mononucleosis 01/2012   Obesity (BMI 30-39.9)    Other and unspecified ovarian cyst 07/24/2012   Right  Ovarian cyst 8 cm on US  7/15 in ER   Pneumonia    Schizo affective schizophrenia (HCC)    Seizures (HCC)    febrile seizures as a child   Sleep apnea    no cpap   UTI (urinary tract infection) during pregnancy    Reviewed problem list, medications and allergies. Physical Assessment:   Vitals:   11/28/23 1343  BP: 136/86  Pulse: 82  Weight: 187 lb (84.8 kg)  Height: 5' 5 (1.651 m)  Body mass index  is 31.12 kg/m.       Physical Examination:   General appearance: alert, well appearing, and in no distress  Psych: mood appropriate, normal affect  Skin: warm & dry   Cardiovascular: normal heart rate noted  Respiratory: normal respiratory effort, no distress  Pelvic: examination not indicated  Extremities: no edema   Chaperone: N/A    Assessment & Plan:  1) OUD -refill current medication - Referral to behavioral health created -plan to transition to [facility] clinic after her next visit   2)Postpartum visit - Meeting milestones appropriately, next visit will be her final 6-week postpartum visit []  plan for IUD   Meds ordered this encounter  Medications   clonazePAM  (KLONOPIN ) 1 MG tablet    Sig: Take 1 tablet (1 mg total) by mouth 3 (three) times daily for 14 days.    Dispense:  42 tablet    Refill:  0   buprenorphine  (SUBUTEX ) 8 MG SUBL SL tablet    Sig: Take 1 under the tongue 4 times daily    Dispense:  56 tablet    Refill:  0      No orders of the defined types were placed in this encounter.   Return in about 2 weeks (around  12/12/2023) for 6wk PPV with IUD- Eure/Keiondra [place].   Kai Calico, DO Attending Obstetrician & Gynecologist, [facility] for [employer], [facility]

## 2023-11-29 ENCOUNTER — Ambulatory Visit: Payer: MEDICAID | Admitting: Family Medicine

## 2023-12-12 ENCOUNTER — Encounter: Payer: Self-pay | Admitting: Obstetrics & Gynecology

## 2023-12-12 ENCOUNTER — Other Ambulatory Visit: Payer: Self-pay | Admitting: Obstetrics & Gynecology

## 2023-12-12 ENCOUNTER — Telehealth: Payer: MEDICAID | Admitting: Obstetrics & Gynecology

## 2023-12-12 DIAGNOSIS — F418 Other specified anxiety disorders: Secondary | ICD-10-CM

## 2023-12-12 DIAGNOSIS — F1190 Opioid use, unspecified, uncomplicated: Secondary | ICD-10-CM

## 2023-12-12 MED ORDER — BUPRENORPHINE HCL 8 MG SL SUBL: SUBLINGUAL_TABLET | SUBLINGUAL | 0 refills | Status: DC

## 2023-12-12 MED ORDER — CLONAZEPAM 1 MG PO TABS: 1.0000 mg | ORAL_TABLET | Freq: Three times a day (TID) | ORAL | 0 refills | Status: DC

## 2023-12-12 NOTE — Progress Notes (Signed)
 My Chart Connect visit: audio + video Patient is at home I am in my office Total time 10 minutes   Follow up appointment medication refill    Chief Complaint  Patient presents with   Medication Refill    Needs refill on SuboxoneClonipin   Back Pain    currently breastfeeding.  Refilled subutex  8 mg 4 times daily + klonopin   MEDS ordered this encounter: Meds ordered this encounter  Medications   buprenorphine  (SUBUTEX ) 8 MG SUBL SL tablet    Sig: Take 1 under the tongue 4 times daily    Dispense:  56 tablet    Refill:  0   clonazePAM  (KLONOPIN ) 1 MG tablet    Sig: Take 1 tablet (1 mg total) by mouth 3 (three) times daily for 14 days.    Dispense:  42 tablet    Refill:  0    Orders for this encounter: No orders of the defined types were placed in this encounter.   Impression + Management Plan   ICD-10-CM   1. Opioid use disorder: Suboxone  8 mg 4 x per day  F11.90 buprenorphine  (SUBUTEX ) 8 MG SUBL SL tablet    clonazePAM  (KLONOPIN ) 1 MG tablet    2. Depression with anxiety  F41.8 clonazePAM  (KLONOPIN ) 1 MG tablet      Follow Up: Return for has appt at [facility] clinic in 2 weeks.     All questions were answered.  Past Medical History:  Diagnosis Date   ADHD (attention deficit hyperactivity disorder)    Anemia    Anxiety    Asthma    as a child   Bipolar 1 disorder (HCC)    Depression    Diabetes mellitus    type 2   Diabetic neuropathy (HCC)    Fatty liver    GHTN vs. CHTN 06/11/2015   Hidradenitis 06/05/2012   History of hiatal hernia    Mononucleosis 01/2012   Obesity (BMI 30-39.9)    Other and unspecified ovarian cyst 07/24/2012   Right  Ovarian cyst 8 cm on US  7/15 in ER   Pneumonia    Schizo affective schizophrenia (HCC)    Seizures (HCC)    febrile seizures as a child   Sleep apnea    no cpap   UTI (urinary tract infection) during pregnancy     Past Surgical History:  Procedure Laterality Date   ADENOIDECTOMY     ANTERIOR CRUCIATE  LIGAMENT REPAIR     INCISION AND DRAINAGE ABSCESS     Recurrent abscesses of labia and groin area with I and D in office and ER   TONSILLECTOMY     WISDOM TOOTH EXTRACTION      OB History     Gravida  3   Para  2   Term      Preterm  2   AB  1   Living  2      SAB  1   IAB      Ectopic      Multiple  0   Live Births  2        Obstetric Comments  2017 started contracting at 28 wks, PPROM at 24wks, [facility]         Allergies  Allergen Reactions   Prozac [Fluoxetine Hcl]     Stiffness in neck, stroke like symptoms   Cephalosporins Other (See Comments)    Unknown childhood reaction; states took last month at the hospital with benadryl  and was okay.  Oleta [Lurasidone Hcl]     Tongue swelling, confusion     Social History   Socioeconomic History   Marital status: Married    Spouse name: Not on file   Number of children: 1   Years of education: Not on file   Highest education level: Not on file  Occupational History   Not on file  Tobacco Use   Smoking status: Never   Smokeless tobacco: Never  Vaping Use   Vaping status: Never Used  Substance and Sexual Activity   Alcohol use: Not Currently   Drug use: Not Currently    Types: Oxycodone , Hydrocodone , Fentanyl     Comment: prescibed meds only   Sexual activity: Yes    Birth control/protection: None  Other Topics Concern   Not on file  Social History Narrative   Not on file   Social Drivers of Health   Financial Resource Strain: Low Risk (08/10/2023)   Received from [facility]   Overall Financial Resource Strain (CARDIA)    How hard is it for you to pay for the very basics like food, housing, medical care, and heating?: Not very hard  Recent Concern: Financial Resource Strain - Medium Risk (05/24/2023)   Overall Financial Resource Strain (CARDIA)    Difficulty of Paying Living Expenses: Somewhat hard  Food Insecurity: No Food Insecurity (11/01/2023)   Hunger Vital Sign    Worried About  Running Out of Food in the Last Year: Never true    Ran Out of Food in the Last Year: Never true  Transportation Needs: No Transportation Needs (11/01/2023)   PRAPARE - Administrator, Civil Service (Medical): No    Lack of Transportation (Non-Medical): No  Physical Activity: Inactive (05/24/2023)   Exercise Vital Sign    Days of Exercise per Week: 0 days    Minutes of Exercise per Session: 0 min  Stress: Stress Concern Present (05/24/2023)   [employer] of Occupational Health - Occupational Stress Questionnaire    Feeling of Stress : To some extent  Social Connections: Moderately Integrated (05/24/2023)   Social Connection and Isolation Panel    Frequency of Communication with Friends and Family: More than three times a week    Frequency of Social Gatherings with Friends and Family: Twice a week    Attends Religious Services: More than 4 times per year    Active Member of [employer] or Organizations: Yes    Attends Banker Meetings: 1 to 4 times per year    Marital Status: Separated    Family History  Problem Relation Age of Onset   Diabetes Mother    Bipolar disorder Father    Post-traumatic stress disorder Father    Asthma Sister        intrinsic   Bipolar disorder Brother    Anxiety disorder Brother    Stroke Maternal Grandmother    Cancer Other        colon, lung, brain-paternal side

## 2023-12-23 NOTE — BH Specialist Note (Unsigned)
 Integrated Behavioral Health Initial In-Person Visit  MRN: [id number] Name: Heather [name]  Number of Integrated Behavioral Health Clinician visits: No data recorded Session Start time: No data recorded   Session End time: No data recorded Total time in minutes: No data recorded   Types of Service: {CHL AMB TYPE OF [phone]}  Interpretor:No. Interpretor Name and Language: n/a   Subjective: Heather [name] is a 28 y.o. female accompanied by {CHL AMB ACCOMPANIED AB:[id number]} Patient was referred by Heather Carolus, MD for ***. Patient reports the following symptoms/concerns: *** Duration of problem: ***; Severity of problem: {Mild/Moderate/Severe:20260}  Objective: Mood: {BHH MOOD:22306} and Affect: {BHH AFFECT:22307} Risk of harm to self or others: {CHL AMB BH Suicide Current Mental Status:21022748}  Life Context: Family and Social: *** School/Work: *** Self-Care: *** Life Changes: ***  Patient and/or Family's Strengths/Protective Factors: {CHL AMB BH PROTECTIVE FACTORS:2480802267}  Goals Addressed: Patient will: Reduce symptoms of: {IBH Symptoms:21014056} Increase knowledge and/or ability of: {IBH Patient Tools:21014057}  Demonstrate ability to: {IBH Goals:21014053}  Progress towards Goals: {CHL AMB BH PROGRESS TOWARDS GOALS:4122939289}  Interventions: Interventions utilized: {IBH Interventions:21014054}  Standardized Assessments completed: {IBH Screening Tools:21014051}   Patient and/or Family Response: ***  Patient Centered Plan: Patient is on the following Treatment Plan(s):  REACH  Clinical Assessment/Diagnosis  No diagnosis found.    Patient may benefit from psychoeducation and brief therapeutic interventions regarding coping with symptoms of *** .  Plan: Follow up with behavioral health clinician on : *** Behavioral recommendations: *** Referral(s): {IBH Referrals:21014055}  Heather BROCKS Jamarkus Lisbon, LCSW     05/24/2023    2:24 PM  10/18/2016   11:25 AM 10/04/2016    2:43 PM 02/02/2016    9:45 AM  Depression screen PHQ 2/9  Decreased Interest 1 0 0 0  Down, Depressed, Hopeless 0 0 0 0  PHQ - 2 Score 1 0 0 0  Altered sleeping 1     Tired, decreased energy 3     Change in appetite 0     Feeling bad or failure about yourself  1     Trouble concentrating 1     Moving slowly or fidgety/restless 0     Suicidal thoughts 0     PHQ-9 Score 7         Data saved with a previous flowsheet row definition      05/24/2023    2:25 PM  GAD 7 : Generalized Anxiety Score  Nervous, Anxious, on Edge 0  Control/stop worrying 0  Worry too much - different things 2  Trouble relaxing 1  Restless 0  Easily annoyed or irritable 3  Afraid - awful might happen 0  Total GAD 7 Score 6

## 2023-12-26 ENCOUNTER — Other Ambulatory Visit: Payer: Self-pay | Admitting: Obstetrics & Gynecology

## 2023-12-26 DIAGNOSIS — F418 Other specified anxiety disorders: Secondary | ICD-10-CM

## 2023-12-26 DIAGNOSIS — F1190 Opioid use, unspecified, uncomplicated: Secondary | ICD-10-CM

## 2023-12-26 MED ORDER — CLONAZEPAM 1 MG PO TABS: ORAL_TABLET | ORAL | 0 refills | Status: DC

## 2023-12-26 MED ORDER — BUPRENORPHINE HCL 8 MG SL SUBL: SUBLINGUAL_TABLET | SUBLINGUAL | 0 refills | Status: DC

## 2023-12-27 ENCOUNTER — Ambulatory Visit: Payer: MEDICAID | Admitting: Family Medicine

## 2023-12-27 ENCOUNTER — Other Ambulatory Visit: Payer: Self-pay

## 2023-12-27 ENCOUNTER — Encounter: Payer: Self-pay | Admitting: Family Medicine

## 2023-12-27 VITALS — BP 135/81 | HR 92 | Wt 195.0 lb

## 2023-12-27 DIAGNOSIS — F319 Bipolar disorder, unspecified: Secondary | ICD-10-CM | POA: Diagnosis not present

## 2023-12-27 DIAGNOSIS — R87610 Atypical squamous cells of undetermined significance on cytologic smear of cervix (ASC-US): Secondary | ICD-10-CM

## 2023-12-27 DIAGNOSIS — F418 Other specified anxiety disorders: Secondary | ICD-10-CM | POA: Diagnosis not present

## 2023-12-27 DIAGNOSIS — F1190 Opioid use, unspecified, uncomplicated: Secondary | ICD-10-CM

## 2023-12-27 MED ORDER — BUPRENORPHINE HCL 8 MG SL SUBL: 8.0000 mg | SUBLINGUAL_TABLET | Freq: Four times a day (QID) | SUBLINGUAL | 0 refills | Status: DC

## 2023-12-27 MED ORDER — CLONAZEPAM 1 MG PO TABS: 1.0000 mg | ORAL_TABLET | Freq: Three times a day (TID) | ORAL | 0 refills | Status: DC | PRN

## 2023-12-27 MED ORDER — GABAPENTIN 800 MG PO TABS: 800.0000 mg | ORAL_TABLET | Freq: Three times a day (TID) | ORAL | 0 refills | Status: DC

## 2023-12-27 NOTE — Assessment & Plan Note (Signed)
 Discussed I am OK with continuing klonopin  for now but goal will be to find a psychiatrist and look for alternative treatments if possible. Reviewed that I am an expert in addiction but only a generalist in mental health and she needs a specialist in that department. Patient OK with plan, refill sent for klonopin  and amb ref made for Psych in [place].

## 2023-12-27 NOTE — Patient Instructions (Signed)
 Center for [facility] at [facility] [address] [place], [place] [address] [phone] (main office) [phone] [facility] office)  New Parent Support Groups [email or web address]

## 2023-12-27 NOTE — Assessment & Plan Note (Addendum)
 Needs colpo, discussed we can do this at her next visit, she is fine with that

## 2023-12-27 NOTE — Assessment & Plan Note (Signed)
 Reports gabapentin  was originally prescribed for mood/bipolar, continue for now pending psych consult.

## 2023-12-27 NOTE — Progress Notes (Deleted)
 "   Post Partum Visit Note  Heather [name] is a 28 y.o. [id number] female who presents for a postpartum visit. She is 7 weeks postpartum following a normal spontaneous vaginal delivery.  I have fully reviewed the prenatal and intrapartum course. The delivery was at [redacted]w[redacted]d gestational weeks.  Anesthesia: epidural. Postpartum course has been good. Baby is doing well. Baby is feeding by breast. Bleeding thick, heavy lochia-started 3 days ago. Pt states bleeding from delivery stopped one week postpartum. Bowel function is normal. Bladder function is normal. Patient is sexually active. Contraception method is IUD-but wants wait until bleeding. Postpartum depression screening: negative   The pregnancy intention screening data noted above was reviewed. Potential methods of contraception were discussed. The patient elected to proceed with No data recorded.    Health Maintenance Due  Topic Date Due   COVID-19 Vaccine (1) Never done   Pneumococcal Vaccine (2 of 2 - PPSV23, PCV20, or PCV21) 06/16/2002   OPHTHALMOLOGY EXAM  Never done   Diabetic kidney evaluation - Urine ACR  Never done   FOOT EXAM  05/06/2018   Influenza Vaccine  08/05/2023    The following portions of the patient's history were reviewed and updated as appropriate: allergies, current medications, past family history, past medical history, past social history, past surgical history, and problem list.  Review of Systems Pertinent items noted in HPI and remainder of comprehensive ROS otherwise negative.  Objective:  There were no vitals taken for this visit.   General:  alert, cooperative, and appears stated age   Breasts:  not indicated  Lungs: Comfortalbe on room air  Wound {Wound assessment:11097}  GU exam:  {desc; normal/abnormal/not indicated:14647}        Assessment:   No diagnosis found.  Normal*** postpartum exam.   Plan:   Essential components of care per ACOG recommendations:  1.  Mood and well being: Patient  with {gen negative/positive:315881} depression screening today. Reviewed local resources for support.  - Patient tobacco use? {tobacco use:25506}  - hx of drug use? {yes/no:25505}    2. Infant care and feeding:  -Patient currently breastmilk feeding? {yes/no:25502}  -Social determinants of health (SDOH) reviewed in EPIC. No concerns***The following needs were identified***  3. Sexuality, contraception and birth spacing - Patient {DOES_DOES WNU:81435} want a pregnancy in the next year.  Desired family size is {NUMBER 1-10:22536} children.  - Reviewed reproductive life planning. Reviewed contraceptive methods based on pt preferences and effectiveness.  Patient desired {PLAN CONTRACEPTION:313102}.   - Discussed birth spacing of 18 months  4. Sleep and fatigue -Encouraged family/partner/community support of 4 hrs of uninterrupted sleep to help with mood and fatigue  5. Physical Recovery  - Discussed patients delivery and complications. She describes her labor as {description:25511} - Patient had a {CHL AMB [phone]}. Patient had a {laceration:25518} laceration. Perineal healing reviewed. Patient expressed understanding - Patient has urinary incontinence? {yes/no:25515} - Patient {ACTION; IS/IS WNU:78978602} safe to resume physical and sexual activity  6.  Health Maintenance - HM due items addressed {Yes or If no, why not?:20788} - Last pap smear  Diagnosis  Date Value Ref Range Status  05/24/2023 (A)  Final   - Atypical squamous cells, cannot exclude high grade squamous  05/24/2023 intraepithelial lesion (ASC-H) (A)  Final   Pap smear {done:10129} at today's visit.  -Breast Cancer screening indicated? {indicated:25516}  7. Chronic Disease/Pregnancy Condition follow up: {Follow up:25499} No diagnosis found.  - PCP follow up   Cyndee JAYSON Molt, RN/MPH Attending Family Medicine  Physician, Biochemist, Clinical for [employer], [facility]  "

## 2023-12-27 NOTE — Assessment & Plan Note (Signed)
 Discussed REACH model Currently stable on subutex  8 QID, refill sent UDS today with verbal consent, reviewed we do not use them punitively and goal is harm reduction Has narcan  at home LFT's up to date and are reassuring

## 2023-12-27 NOTE — Progress Notes (Signed)
 "   Heather [name] is a 28 y.o. [id number] here today for SUD f/u.  Had been maintained on meds by [facility] until delivery, now postpartum and transferring care  Reports she has been abstinent from fentanyl  for about a year Initially on suboxone  but was getting rashes, then swiched to subutex  and that improved Then she got pregnant and [facility] health department told her she needed to transfer After that was being managed by [facility] but now that she is postpartum she was transferred down to us  Reports she had a relapse towards the end of the pregnancy and was related to not getting a refill of her meds Got some illicits, 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, methamphetamines, cocaine Currently reports she is stable and strongly desires to stay on subutex  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 CHTN 06/11/2015   Hidradenitis 06/05/2012   History of hiatal hernia    Mononucleosis 01/2012   Obesity (BMI 30-39.9)    Other and unspecified ovarian cyst 07/24/2012   Right  Ovarian cyst 8 cm on US  7/15 in ER   Pneumonia    Schizo affective schizophrenia (HCC)    Seizures (HCC)    febrile seizures as a child   Sleep apnea    no cpap   UTI (urinary tract infection) during pregnancy     Past Surgical History:  Procedure Laterality Date   ADENOIDECTOMY     ANTERIOR CRUCIATE LIGAMENT REPAIR      INCISION AND DRAINAGE ABSCESS     Recurrent abscesses of labia and groin area with I and D in office and ER   TONSILLECTOMY     WISDOM TOOTH EXTRACTION      The following portions of the patient's history were reviewed and updated as appropriate: allergies, current medications, past family history, past medical history, past social history, past surgical history and problem list.   Health Maintenance:   Last pap:  Result Date Procedure Results Follow-ups  05/24/2023 Cytology - PAP High risk HPV: Positive (A) HPV 16: Negative HPV 18 / 45: Negative Adequacy: Satisfactory for evaluation; transformation zone component PRESENT. Diagnosis: - Atypical squamous cells, cannot exclude high grade squamous (A) Diagnosis: intraepithelial lesion (ASC-H) (A) Comment: Normal Reference Range HPV - Negative Comment: Normal Reference Range HPV 16- Negative Comment: Normal Reference Range HPV 16 18 45 -Negative    Needs colpo  Last mammogram:  N/a    Hepatitis serologies: Lab Results  Component Value Date   HEPAIGM Negative 06/27/2014   HEPBIGM Negative 06/27/2014    Hep A Immunization: deferred to future visit  Hep B Immunization: deferred to future visit  Last LFTs: Lab Results  Component Value Date  ALT 9 11/02/2023   AST 13 (L) 11/02/2023   ALKPHOS 105 11/02/2023   BILITOT 0.4 11/02/2023     Review of Systems:  Pertinent items noted in HPI and remainder of comprehensive ROS otherwise negative.  Physical Exam:  BP 135/81   Pulse 92   Wt 195 lb (88.5 kg)   LMP 12/25/2023   Breastfeeding Yes   BMI 32.45 kg/m  CONSTITUTIONAL: Well-developed, well-nourished female in no acute distress.  HEENT:  Normocephalic, atraumatic. External right and left ear normal. No scleral icterus.  NECK: Normal range of motion, supple, no masses noted on observation SKIN: No rash noted. Not diaphoretic. No erythema. No pallor. MUSCULOSKELETAL: Normal range of motion. No edema  noted. NEUROLOGIC: Alert and oriented to person, place, and time. Normal muscle tone coordination.  PSYCHIATRIC: Normal mood and affect. Normal behavior. Normal judgment and thought content. RESPIRATORY: Effort normal, no problems with respiration noted   Labs and Imaging I have reviewed the PDMP during this encounter.    Last UDS: No results found for: CREATIUR  No results found for this or any previous visit (from the past week). No results found.     None recently   Assessment and Plan:   Problem List Items Addressed This Visit       Other   Abnormal Pap smear of cervix - Primary   Needs colpo, discussed we can do this at her next visit, she is fine with that      Bipolar disorder, unspecified (HCC)   Reports gabapentin  was originally prescribed for mood/bipolar, continue for now pending psych consult.       Relevant Medications   gabapentin  (NEURONTIN ) 800 MG tablet   Other Relevant Orders   Ambulatory referral to Psychiatry   Depression with anxiety   Discussed I am OK with continuing klonopin  for now but goal will be to find a psychiatrist and look for alternative treatments if possible. Reviewed that I am an expert in addiction but only a generalist in mental health and she needs a specialist in that department. Patient OK with plan, refill sent for klonopin  and amb ref made for Psych in [place].       Relevant Medications   clonazePAM  (KLONOPIN ) 1 MG tablet   Other Relevant Orders   ToxAssure Flex 15, Ur   Ambulatory referral to Psychiatry   Opioid use disorder   Discussed REACH model Currently stable on subutex  8 QID, refill sent UDS today with verbal consent, reviewed we do not use them punitively and goal is harm reduction Has narcan  at home LFT's up to date and are reassuring      Relevant Medications   buprenorphine  (SUBUTEX ) 8 MG SUBL SL tablet   clonazePAM  (KLONOPIN ) 1 MG tablet   Other Relevant Orders   ToxAssure Flex 15, Ur       Return in about 4 weeks (around 01/24/2024) for REACH clinic, OUD f/u, colpo, possible hormonal IUD placement.    Total face-to-face time with patient: 30 minutes.  Over 50% of encounter was spent on counseling and coordination of care.  Future Appointments  Date Time Provider Department Center  01/24/2024  1:15 PM [facility] HEALTH CLINICIAN [facility]  01/24/2024  2:55 PM Lola Donnice HERO, MD [facility]    Donnice HERO Lola, MD/MPH Attending Family Medicine Physician, [facility] for [facility], [facility]  "

## 2024-01-01 LAB — TOXASSURE FLEX 15, UR
6-ACETYLMORPHINE IA: NEGATIVE ng/mL
7-aminoclonazepam: 217 ng/mg{creat}
AMPHETAMINES IA: NEGATIVE ng/mL
Alpha-hydroxyalprazolam: NOT DETECTED ng/mg{creat}
Alpha-hydroxymidazolam: NOT DETECTED ng/mg{creat}
Alpha-hydroxytriazolam: NOT DETECTED ng/mg{creat}
Alprazolam: NOT DETECTED ng/mg{creat}
BARBITURATES IA: NEGATIVE ng/mL
BUPRENORPHINE: POSITIVE
Benzodiazepines: POSITIVE
Buprenorphine: 434 ng/mg{creat}
CANNABINOIDS IA: NEGATIVE ng/mL
COCAINE METABOLITE IA: NEGATIVE ng/mL
Clonazepam: NOT DETECTED ng/mg{creat}
Creatinine: 107 mg/dL
Desalkylflurazepam: NOT DETECTED ng/mg{creat}
Desmethyldiazepam: NOT DETECTED ng/mg{creat}
Desmethylflunitrazepam: NOT DETECTED ng/mg{creat}
Diazepam: NOT DETECTED ng/mg{creat}
ETHYL ALCOHOL Enzymatic: NEGATIVE g/dL
FENTANYL: NEGATIVE
Fentanyl: NOT DETECTED ng/mg{creat}
Flunitrazepam: NOT DETECTED ng/mg{creat}
Lorazepam: NOT DETECTED ng/mg{creat}
METHADONE IA: NEGATIVE ng/mL
METHADONE MTB IA: NEGATIVE ng/mL
Midazolam: NOT DETECTED ng/mg{creat}
Norbuprenorphine: >935 ng/mg{creat}
Norfentanyl: NOT DETECTED ng/mg{creat}
OPIATE CLASS IA: NEGATIVE ng/mL
OXYCODONE CLASS IA: NEGATIVE ng/mL
Oxazepam: NOT DETECTED ng/mg{creat}
PHENCYCLIDINE IA: NEGATIVE ng/mL
TAPENTADOL, IA: NEGATIVE ng/mL
TRAMADOL IA: NEGATIVE ng/mL
Temazepam: NOT DETECTED ng/mg{creat}

## 2024-01-02 ENCOUNTER — Ambulatory Visit: Payer: Self-pay | Admitting: Advanced Practice Midwife

## 2024-01-10 ENCOUNTER — Encounter: Payer: Self-pay | Admitting: Family Medicine

## 2024-01-10 DIAGNOSIS — F1190 Opioid use, unspecified, uncomplicated: Secondary | ICD-10-CM

## 2024-01-10 DIAGNOSIS — F418 Other specified anxiety disorders: Secondary | ICD-10-CM

## 2024-01-10 DIAGNOSIS — F319 Bipolar disorder, unspecified: Secondary | ICD-10-CM

## 2024-01-13 NOTE — BH Specialist Note (Unsigned)
 Integrated Behavioral Health Initial In-Person Visit  MRN: [id number] Name: Heather [name]  Number of Integrated Behavioral Health Clinician visits: 1- Initial Visit  Session Start time: 1340    Session End time: 1414  Total time in minutes: 34    Types of Service: {CHL AMB TYPE OF [phone]}  Interpretor:{yes wn:685467} Interpretor Name and Language: ***   Subjective: Heather [name] is a 29 y.o. female accompanied by {CHL AMB ACCOMPANIED AB:7898698982} Patient was referred by *** for ***. Patient reports the following symptoms/concerns: *** Duration of problem: ***; Severity of problem: {Mild/Moderate/Severe:20260}  Objective: Mood: {BHH MOOD:22306} and Affect: {BHH AFFECT:22307} Risk of harm to self or others: {CHL AMB BH Suicide Current Mental Status:21022748}  Life Context: Family and Social: *** School/Work: *** Self-Care: *** Life Changes: ***  Patient and/or Family's Strengths/Protective Factors: {CHL AMB BH PROTECTIVE FACTORS:8163668673}  Goals Addressed: Patient will: Reduce symptoms of: {IBH Symptoms:21014056} Increase knowledge and/or ability of: {IBH Patient Tools:21014057}  Demonstrate ability to: {IBH Goals:21014053}  Progress towards Goals: {CHL AMB BH PROGRESS TOWARDS GOALS:[phone]}  Interventions: Interventions utilized: {IBH Interventions:21014054}  Standardized Assessments completed: {IBH Screening Tools:21014051}     Patient and/or Family Response: ***  Patient Centered Plan: Patient is on the following Treatment Plan(s):  ***  Clinical Assessment/Diagnosis  No diagnosis found.   Assessment: Patient currently experiencing ***.   Patient may benefit from ***.  Plan: Follow up with behavioral health clinician on : *** Behavioral recommendations: *** Referral(s): {IBH Referrals:21014055}  Warren BROCKS Arley Garant, LCSW

## 2024-01-19 MED ORDER — BUPRENORPHINE HCL 8 MG SL SUBL: 8.0000 mg | SUBLINGUAL_TABLET | Freq: Four times a day (QID) | SUBLINGUAL | 0 refills | Status: AC

## 2024-01-20 ENCOUNTER — Ambulatory Visit: Payer: Self-pay | Admitting: Clinical

## 2024-01-20 NOTE — BH Specialist Note (Unsigned)
 Behavioral Health Treatment Plan   Name:Heather [name]  MRN: [id number]   Treatment Plan Development Date: ***   Strengths: {BH Strengths:210964339}  Supports: {BH Supports:210964340}   Client Statement of Needs: ***   Treatment Level:***  Client Treatment Preferences:***   {BH Diagnoses:210964321}   Expected duration of treatment: ***  Party responsible for implementation of interventions: ***.   This plan has been reviewed and created by the following participants: ***   A new plan will be created at least every 12 months.  The patient fully participated in the development of treatment plan with the clinician and verbally consents to such treatment.   Patient Treatment Plan Signature Obtained: {BH Signature Options:210964341}   Warren JAYSON Mering, LCSW

## 2024-01-24 ENCOUNTER — Ambulatory Visit: Payer: Self-pay | Admitting: Family Medicine

## 2024-01-24 MED ORDER — CLONAZEPAM 1 MG PO TABS: 1.0000 mg | ORAL_TABLET | Freq: Three times a day (TID) | ORAL | 0 refills | Status: AC | PRN

## 2024-01-24 NOTE — Addendum Note (Signed)
 Addended by: Alejo Beamer, MATEO on: 01/24/2024 09:10 AM   Modules accepted: Orders

## 2024-02-01 MED ORDER — GABAPENTIN 800 MG PO TABS: 800.0000 mg | ORAL_TABLET | Freq: Three times a day (TID) | ORAL | 2 refills | Status: AC

## 2024-02-01 NOTE — Addendum Note (Signed)
 Addended by: Zlata Alcaide, MATEO on: 02/01/2024 04:08 PM   Modules accepted: Orders

## 2024-02-03 NOTE — BH Specialist Note (Unsigned)
 Integrated Behavioral Health Initial In-Person Visit  MRN: [id number] Name: SABAH ZUCCO  Number of Integrated Behavioral Health Clinician visits: 1- Initial Visit  Session Start time: 1340    Session End time: 1414  Total time in minutes: 34    Types of Service: {CHL AMB TYPE OF [phone]}  Interpretor:{yes wn:[id number]} Interpretor Name and Language: ***   Subjective: Heather [name] is a 29 y.o. female accompanied by {CHL AMB ACCOMPANIED AB:[id number]} Patient was referred by *** for ***. Patient reports the following symptoms/concerns: *** Duration of problem: ***; Severity of problem: {Mild/Moderate/Severe:20260}  Objective: Mood: {BHH MOOD:22306} and Affect: {BHH AFFECT:22307} Risk of harm to self or others: {CHL AMB BH Suicide Current Mental Status:21022748}  Life Context: Family and Social: *** School/Work: *** Self-Care: *** Life Changes: ***  Patient and/or Family's Strengths/Protective Factors: {CHL AMB BH PROTECTIVE FACTORS:[id number]}  Goals Addressed: Patient will: Reduce symptoms of: {IBH Symptoms:21014056} Increase knowledge and/or ability of: {IBH Patient Tools:21014057}  Demonstrate ability to: {IBH Goals:21014053}  Progress towards Goals: {CHL AMB BH PROGRESS TOWARDS GOALS:[phone]}  Interventions: Interventions utilized: {IBH Interventions:21014054}  Standardized Assessments completed: {IBH Screening Tools:21014051}     Patient and/or Family Response: ***  Patient Centered Plan: Patient is on the following Treatment Plan(s):  ***  Clinical Assessment/Diagnosis  No diagnosis found.   Assessment: Patient currently experiencing ***.   Patient may benefit from ***.  Plan: Follow up with behavioral health clinician on : *** Behavioral recommendations: *** Referral(s): {IBH Referrals:21014055}  Warren BROCKS Arley Garant, LCSW

## 2024-02-07 ENCOUNTER — Ambulatory Visit: Payer: MEDICAID | Admitting: Family Medicine
